# Patient Record
Sex: Female | Born: 1955 | Race: Black or African American | Hispanic: No | Marital: Married | Smoking: Never smoker
Health system: Southern US, Community
[De-identification: ages and names within clinical notes are randomized; demographics above are authoritative.]

## PROBLEM LIST (undated history)

## (undated) DIAGNOSIS — I509 Heart failure, unspecified: Secondary | ICD-10-CM

## (undated) DIAGNOSIS — J449 Chronic obstructive pulmonary disease, unspecified: Secondary | ICD-10-CM

## (undated) DIAGNOSIS — M1990 Unspecified osteoarthritis, unspecified site: Secondary | ICD-10-CM

## (undated) DIAGNOSIS — E119 Type 2 diabetes mellitus without complications: Secondary | ICD-10-CM

## (undated) HISTORY — PX: CARPAL TUNNEL RELEASE: SHX101

---

## 2004-07-13 ENCOUNTER — Emergency Department: Payer: Self-pay | Admitting: Internal Medicine

## 2005-06-20 ENCOUNTER — Ambulatory Visit: Payer: Self-pay | Admitting: General Practice

## 2007-07-16 ENCOUNTER — Other Ambulatory Visit: Payer: Self-pay

## 2007-07-16 ENCOUNTER — Emergency Department: Payer: Self-pay | Admitting: Emergency Medicine

## 2010-07-09 ENCOUNTER — Emergency Department: Payer: Self-pay | Admitting: Emergency Medicine

## 2010-09-21 ENCOUNTER — Emergency Department: Payer: Self-pay | Admitting: Unknown Physician Specialty

## 2012-07-02 ENCOUNTER — Emergency Department: Payer: Self-pay | Admitting: Emergency Medicine

## 2013-06-04 ENCOUNTER — Emergency Department: Payer: Self-pay | Admitting: Emergency Medicine

## 2013-06-20 ENCOUNTER — Emergency Department: Payer: Self-pay | Admitting: Emergency Medicine

## 2013-10-01 ENCOUNTER — Observation Stay: Payer: Self-pay | Admitting: Internal Medicine

## 2013-10-01 LAB — DRUG SCREEN, URINE

## 2013-10-01 LAB — BASIC METABOLIC PANEL
Anion Gap: 6 — ABNORMAL LOW (ref 7–16)
BUN: 14 mg/dL (ref 7–18)
CHLORIDE: 105 mmol/L (ref 98–107)
Calcium, Total: 8.2 mg/dL — ABNORMAL LOW (ref 8.5–10.1)
Co2: 29 mmol/L (ref 21–32)
Creatinine: 0.9 mg/dL (ref 0.60–1.30)
Glucose: 120 mg/dL — ABNORMAL HIGH (ref 65–99)
OSMOLALITY: 281 (ref 275–301)
Potassium: 4.3 mmol/L (ref 3.5–5.1)
Sodium: 140 mmol/L (ref 136–145)

## 2013-10-01 LAB — CBC
HCT: 43.5 % (ref 35.0–47.0)
HGB: 13.9 g/dL (ref 12.0–16.0)
MCH: 26.2 pg (ref 26.0–34.0)
MCHC: 32 g/dL (ref 32.0–36.0)
MCV: 82 fL (ref 80–100)
Platelet: 381 10*3/uL (ref 150–440)
RBC: 5.32 10*6/uL — ABNORMAL HIGH (ref 3.80–5.20)
RDW: 14.4 % (ref 11.5–14.5)
WBC: 14.3 10*3/uL — AB (ref 3.6–11.0)

## 2013-10-01 LAB — CK TOTAL AND CKMB (NOT AT ARMC)
CK, Total: 57 U/L
CK, Total: 56 U/L
CK-MB: 1 ng/mL (ref 0.5–3.6)
CK-MB: 0.9 ng/mL (ref 0.5–3.6)

## 2013-10-01 LAB — TROPONIN I
Troponin-I: 0.06 ng/mL — ABNORMAL HIGH
Troponin-I: 0.05 ng/mL
Troponin-I: 0.04 ng/mL

## 2013-10-01 LAB — PRO B NATRIURETIC PEPTIDE: B-Type Natriuretic Peptide: 1427 pg/mL — ABNORMAL HIGH (ref 0–125)

## 2013-10-02 LAB — CBC WITH DIFFERENTIAL/PLATELET
BASOS ABS: 0.2 10*3/uL — AB (ref 0.0–0.1)
BASOS PCT: 1.2 %
Eosinophil #: 0.1 10*3/uL (ref 0.0–0.7)
Eosinophil %: 1.1 %
HCT: 40.3 % (ref 35.0–47.0)
HGB: 12.9 g/dL (ref 12.0–16.0)
Lymphocyte #: 3.4 10*3/uL (ref 1.0–3.6)
Lymphocyte %: 24.7 %
MCH: 26.1 pg (ref 26.0–34.0)
MCHC: 32.1 g/dL (ref 32.0–36.0)
MCV: 81 fL (ref 80–100)
MONOS PCT: 11.2 %
Monocyte #: 1.5 x10 3/mm — ABNORMAL HIGH (ref 0.2–0.9)
NEUTROS ABS: 8.4 10*3/uL — AB (ref 1.4–6.5)
Neutrophil %: 61.8 %
PLATELETS: 348 10*3/uL (ref 150–440)
RBC: 4.96 10*6/uL (ref 3.80–5.20)
RDW: 14.4 % (ref 11.5–14.5)
WBC: 13.6 10*3/uL — ABNORMAL HIGH (ref 3.6–11.0)

## 2013-10-02 LAB — BASIC METABOLIC PANEL
Anion Gap: 7 (ref 7–16)
BUN: 15 mg/dL (ref 7–18)
CALCIUM: 8.3 mg/dL — AB (ref 8.5–10.1)
CHLORIDE: 102 mmol/L (ref 98–107)
CO2: 29 mmol/L (ref 21–32)
CREATININE: 0.93 mg/dL (ref 0.60–1.30)
EGFR (African American): >60
EGFR (Non-African Amer.): >60
Glucose: 132 mg/dL — ABNORMAL HIGH (ref 65–99)
OSMOLALITY: 278 (ref 275–301)
Potassium: 4 mmol/L (ref 3.5–5.1)
Sodium: 138 mmol/L (ref 136–145)

## 2013-10-03 LAB — CBC WITH DIFFERENTIAL/PLATELET
BASOS PCT: 0.8 %
Basophil #: 0.1 10*3/uL (ref 0.0–0.1)
Eosinophil #: 0.2 10*3/uL (ref 0.0–0.7)
Eosinophil %: 1.1 %
HCT: 40.7 % (ref 35.0–47.0)
HGB: 12.9 g/dL (ref 12.0–16.0)
Lymphocyte #: 3.6 10*3/uL (ref 1.0–3.6)
Lymphocyte %: 26.4 %
MCH: 25.9 pg — ABNORMAL LOW (ref 26.0–34.0)
MCHC: 31.7 g/dL — AB (ref 32.0–36.0)
MCV: 82 fL (ref 80–100)
MONOS PCT: 14.7 %
Monocyte #: 2 x10 3/mm — ABNORMAL HIGH (ref 0.2–0.9)
Neutrophil #: 7.8 10*3/uL — ABNORMAL HIGH (ref 1.4–6.5)
Neutrophil %: 57 %
Platelet: 334 10*3/uL (ref 150–440)
RBC: 4.97 10*6/uL (ref 3.80–5.20)
RDW: 14.7 % — ABNORMAL HIGH (ref 11.5–14.5)
WBC: 13.8 10*3/uL — ABNORMAL HIGH (ref 3.6–11.0)

## 2013-10-03 LAB — BASIC METABOLIC PANEL
ANION GAP: 6 — AB (ref 7–16)
BUN: 14 mg/dL (ref 7–18)
Calcium, Total: 8.3 mg/dL — ABNORMAL LOW (ref 8.5–10.1)
Chloride: 99 mmol/L (ref 98–107)
Co2: 30 mmol/L (ref 21–32)
Creatinine: 1.02 mg/dL (ref 0.60–1.30)
EGFR (African American): >60
EGFR (Non-African Amer.): >60
Glucose: 151 mg/dL — ABNORMAL HIGH (ref 65–99)
Osmolality: 273 (ref 275–301)
Potassium: 4.3 mmol/L (ref 3.5–5.1)
Sodium: 135 mmol/L — ABNORMAL LOW (ref 136–145)

## 2013-10-03 LAB — HEMOGLOBIN A1C: HEMOGLOBIN A1C: 6.7 % — AB (ref 4.2–6.3)

## 2013-10-29 ENCOUNTER — Observation Stay: Payer: Self-pay | Admitting: Psychiatry

## 2013-10-29 LAB — BASIC METABOLIC PANEL
ANION GAP: 11 (ref 7–16)
BUN: 19 mg/dL — ABNORMAL HIGH (ref 7–18)
Calcium, Total: 9.1 mg/dL (ref 8.5–10.1)
Chloride: 100 mmol/L (ref 98–107)
Co2: 29 mmol/L (ref 21–32)
Creatinine: 1 mg/dL (ref 0.60–1.30)
Glucose: 132 mg/dL — ABNORMAL HIGH (ref 65–99)
Osmolality: 284 (ref 275–301)
Potassium: 3.2 mmol/L — ABNORMAL LOW (ref 3.5–5.1)
Sodium: 140 mmol/L (ref 136–145)

## 2013-10-29 LAB — URINALYSIS, COMPLETE
BILIRUBIN, UR: NEGATIVE
BLOOD: NEGATIVE
BLOOD: NEGATIVE
Bacteria: NONE SEEN
Bilirubin,UR: NEGATIVE
Glucose,UR: NEGATIVE mg/dL (ref 0–75)
Glucose,UR: NEGATIVE mg/dL (ref 0–75)
KETONE: NEGATIVE
KETONE: NEGATIVE
Leukocyte Esterase: NEGATIVE
NITRITE: NEGATIVE
Nitrite: NEGATIVE
PH: 5 (ref 4.5–8.0)
Ph: 5 (ref 4.5–8.0)
Protein: 30
Protein: NEGATIVE
Specific Gravity: 1.028 (ref 1.003–1.030)
Specific Gravity: 1.018 (ref 1.003–1.030)
Squamous Epithelial: 48
Squamous Epithelial: NONE SEEN
WBC UR: 24 /HPF (ref 0–5)

## 2013-10-29 LAB — CBC
HCT: 41.7 % (ref 35.0–47.0)
HGB: 13.1 g/dL (ref 12.0–16.0)
MCH: 25.4 pg — ABNORMAL LOW (ref 26.0–34.0)
MCHC: 31.4 g/dL — ABNORMAL LOW (ref 32.0–36.0)
MCV: 81 fL (ref 80–100)
Platelet: 656 10*3/uL — ABNORMAL HIGH (ref 150–440)
RBC: 5.14 10*6/uL (ref 3.80–5.20)
RDW: 14.6 % — ABNORMAL HIGH (ref 11.5–14.5)
WBC: 24.3 10*3/uL — ABNORMAL HIGH (ref 3.6–11.0)

## 2013-10-29 LAB — DIFFERENTIAL
Basophil #: 0.3 10*3/uL — ABNORMAL HIGH (ref 0.0–0.1)
Basophil %: 1.2 %
EOS ABS: 0.1 10*3/uL (ref 0.0–0.7)
EOS PCT: 0.3 %
LYMPHS ABS: 6.4 10*3/uL — AB (ref 1.0–3.6)
Lymphocyte %: 26.5 %
MONO ABS: 1.5 x10 3/mm — AB (ref 0.2–0.9)
Monocyte %: 6.1 %
NEUTROS ABS: 16 10*3/uL — AB (ref 1.4–6.5)
Neutrophil %: 65.9 %

## 2013-10-29 LAB — TROPONIN I: Troponin-I: <0.02 ng/mL

## 2013-10-29 LAB — MAGNESIUM: Magnesium: 2 mg/dL

## 2013-10-30 LAB — CBC WITH DIFFERENTIAL/PLATELET
BASOS ABS: 0.1 10*3/uL (ref 0.0–0.1)
BASOS PCT: 0.6 %
EOS ABS: 0 10*3/uL (ref 0.0–0.7)
Eosinophil %: 0.1 %
HCT: 40.3 % (ref 35.0–47.0)
HGB: 12.5 g/dL (ref 12.0–16.0)
Lymphocyte #: 1.5 10*3/uL (ref 1.0–3.6)
Lymphocyte %: 8.3 %
MCH: 25.3 pg — ABNORMAL LOW (ref 26.0–34.0)
MCHC: 31.1 g/dL — ABNORMAL LOW (ref 32.0–36.0)
MCV: 81 fL (ref 80–100)
Monocyte #: 0.4 x10 3/mm (ref 0.2–0.9)
Monocyte %: 2.3 %
NEUTROS ABS: 16.5 10*3/uL — AB (ref 1.4–6.5)
NEUTROS PCT: 88.7 %
PLATELETS: 596 10*3/uL — AB (ref 150–440)
RBC: 4.96 10*6/uL (ref 3.80–5.20)
RDW: 14.5 % (ref 11.5–14.5)
WBC: 18.6 10*3/uL — AB (ref 3.6–11.0)

## 2013-10-30 LAB — TROPONIN I

## 2013-10-31 LAB — URINE CULTURE

## 2013-11-03 LAB — CULTURE, BLOOD (SINGLE)

## 2014-05-11 ENCOUNTER — Emergency Department
Admit: 2014-05-11 | Disposition: A | Discharge status: Home / Self Care | Payer: Self-pay | Admitting: Emergency Medicine

## 2014-06-03 NOTE — Discharge Summary (Signed)
PATIENT NAME:  Amber Aguilar, Amber Aguilar MR#:  [id number] DATE OF BIRTH:  1955/04/06  DATE OF ADMISSION:  10/29/2013 DATE OF DISCHARGE:  10/30/2013  PRIMARY CARE PHYSICIAN: Nonlocal.  DISCHARGE DIAGNOSES:  1. Syncope, possibly due to vasovagal syncope.  2. Leukocytosis.  3. Chronic diastolic congestive heart failure.   CONDITION: Stable.   CODE STATUS: Full code.   HOME MEDICATIONS: Please refer to the medication reconciliation list.   DIET: Low-sodium diet.   ACTIVITY: As tolerated.   FOLLOW-UP CARE: With PCP within 1 to 2 weeks.   REASON FOR ADMISSION: Syncope.   HOSPITAL COURSE: The patient is a 59 year old, African-American female with a history of diastolic CHF, recent acute bronchitis on prednisone that presented to the ED with 1 episode of syncope during shortness of breath and cough. The patient passed out for 1 minute, but no seizure or no incontinence. For a detailed history and physical examination, please refer to the admission note dictated by Dr. Darvin Neighbours. After admission, the patient had been placed on telemonitor which shows normal sinus rhythm. The patient's syncope is possibly due to vasovagal syncope. The patient had no complaints after admission. Leukocytosis is possibly due to prednisone and decreased from 24.5 to 18.6. The patient has a history of chronic diastolic CHF, which is stable. The patient has no complaints. She is clinically stable and will be discharged home today. I discussed the patient's discharge plan with the patient and nurse.   TIME SPENT: About 33 minutes.     ____________________________ Demetrios Loll, MD qc:TT D: 10/30/2013 11:32:56 ET T: 10/30/2013 18:40:22 ET JOB#: [id number]  cc: Demetrios Loll, MD, <Dictator> Demetrios Loll MD ELECTRONICALLY SIGNED 10/31/2013 12:04

## 2014-06-03 NOTE — Consult Note (Signed)
PATIENT NAME:  Amber Aguilar, RESENDES MR#:  [id number] DATE OF BIRTH:  Mar 17, 1955  DATE OF CONSULTATION:  10/01/2013  REFERRING PHYSICIAN:   CONSULTING PHYSICIAN:  Isaias Cowman, MD  PRIMARY CARE PHYSICIAN: Dr. Brunetta Genera.   REASON FOR CONSULTATION: Shortness of breath.   HISTORY OF PRESENT ILLNESS: The patient is a 59 year old female who presents with a 5-day history of worsening peripheral edema and shortness of breath. The patient was recently seen by her primary care physician at [facility] for apparent shortness of breath and possible pneumonia. She was recently started on prednisone. Since then, the patient has noted some mild peripheral edema, and today experienced shortness of breath just by walking across the room. She presented to [facility] Emergency Room where a chest CT revealed pulmonary edema. Chest x-ray revealed mild bibasal atelectasis and infiltrate. Admission labs were notable for very mild elevation and troponin of 0.06 with a follow-up that was normal. BNP was 1427. The patient denies chest pain.   PAST MEDICAL HISTORY:  1.  Recent questionable pneumonia.  2.  Carpal tunnel syndrome.   MEDICATIONS: Ibuprofen 800 mg p.o. t.i.d., p.r.n., Flonase 2 sprays each nostril daily, and Prempro 1 daily.   SOCIAL HISTORY: The patient is married. She denies tobacco abuse.   FAMILY HISTORY: Positive for coronary artery disease.   REVIEW OF SYSTEMS:  CONSTITUTIONAL: No fever or chills.  EYES: No blurry vision.  EARS: No hearing loss.  RESPIRATORY: The patient has had progressive shortness of breath.  CARDIOVASCULAR: No chest pain.  GASTROINTESTINAL: No nausea, vomiting, or diarrhea.  GENITOURINARY: No dysuria or hematuria.  ENDOCRINE: No polyuria or polydipsia.  MUSCULOSKELETAL: No arthralgias or myalgias.  NEUROLOGICAL: No focal muscle weakness or numbness.  PSYCHOLOGICAL: No depression or anxiety.   PHYSICAL EXAMINATION:  VITAL SIGNS: Blood pressure 108/52, pulse 102,  respirations 18, temperature 97.8, pulse oximetry 98%.  HEENT: Pupils equal and reactive to light and accommodation.  NECK: Supple without thyromegaly.  LUNGS: Clear.  HEART: Normal JVP. Normal PMI. Regular rate and rhythm. Normal S1, S2. No appreciable gallop, murmur, or rub.  ABDOMEN: Soft and nontender. Pulses were intact bilaterally.  MUSCULOSKELETAL: Normal muscle tone.  NEUROLOGIC: The patient is alert and oriented x 3. Motor and sensory both grossly intact.   IMPRESSION: A 59 year old female with recent questionable bronchitis/pneumonia treated with antibiotics and prednisone was scheduled to have pulmonary consult,  develop shortness of breath, presented to [facility] Emergency Room where the patient had a very mildly elevation in troponin without chest pain, which is likely due to demand supply ischemia and not due to acute coronary syndrome. CT scan suggests mild pulmonary edema.   RECOMMENDATIONS:  1.  Agree with overall current therapy.  2.  Defer full dose anticoagulation.  3.  Agree with mild diuresis.  4.  Review 2-D echocardiogram.  5.  Further recommendations pending echocardiogram results.   ____________________________ Isaias Cowman, MD ap:TT D: 10/01/2013 15:39:16 ET T: 10/01/2013 16:08:04 ET JOB#: [id number]  cc: Isaias Cowman, MD, <Dictator> Isaias Cowman MD ELECTRONICALLY SIGNED 10/13/2013 16:00

## 2014-06-03 NOTE — Discharge Summary (Signed)
PATIENT NAME:  Amber Aguilar, Amber Aguilar MR#:  [id number] DATE OF BIRTH:  [birth year]  DATE OF ADMISSION:  10/01/2013 DATE OF DISCHARGE:  10/03/2013  DISCHARGE DIAGNOSES: 1.  Acute bronchitis, on empiric antibiotics.  2.  Chronic diastolic congestive heart failure, well-compensated.  3.  Elevated troponins due to supply/demand ischemia, no myocardial infarction.   SECONDARY DIAGNOSIS: Carpal tunnel syndrome.   CONSULTATION: Cardiology, Dr. Saralyn Pilar.   PROCEDURES AND RADIOLOGY: Chest x-ray on August 22 showed mild basilar atelectasis/infiltrate.   CT scan of the chest on August 22 showed no significant acute pathology. Mild diffuse pulmonary edema pattern worse in the lower lobes.   A 2-D echocardiogram on August 23 showed normal LVEF of 60% to 65%. Impaired relaxation pattern of LV diastolic filling. Mildly increased LV posterior wall thickness.   HISTORY AND SHORT HOSPITAL COURSE: The patient is a 59 year old female with the above-mentioned medical problems who was admitted for shortness of breath. Please see Dr. Fulton Reek dictated history and physical for further details. Cardiology consultation was obtained with Dr. Isaias Cowman for requested 2-D echocardiogram to evaluate LV function, was come out to be a normal limit. The patient have some mild diastolic heart failure for which she was prescribed diuretic as needed. Her troponins were thought to be due to supply demand ischemia. She was thought to have acute bronchitis and no other obvious infectious signs were noted. She was feeling much better and was ambulated without any oxygen desaturation. She was discharged home in stable condition on August 24.   On the date of discharge her vital signs were as follows: Temperature 98.2, heart rate 98 per minute, respirations 18 per minute, blood pressure 120/70 mmHg, she was saturating 97% on room air.   PERTINENT PHYSICAL EXAMINATION ON THE DATE OF DISCHARGE:  CARDIOVASCULAR: S1, S2 normal.  No murmurs, rales, or gallop.  LUNGS: Clear to auscultation bilaterally. No wheezing, rales, rhonchi, or crepitation.  ABDOMEN: Soft and benign.  NEUROLOGIC: Nonfocal examination.  All other physical examination remained at baseline.   DISCHARGE MEDICATIONS: 1.  Flonase 1 inhalation daily.  2.  Omeprazole 20 mg p.o. daily.  3.  Ibuprofen 800 mg p.o. 3 times a day as needed.  4.  Conjugated estrogen/medroxyprogesterone once daily.  5.  QVAR 1 puff inhaled twice a day.  6.  Hydrochlorothiazide 25 mg p.o. daily as needed.  7.  Levaquin 750 mg p.o. daily for 5 more days.   DISCHARGE DIET: Low sodium.   DISCHARGE ACTIVITY: As tolerated.   DISCHARGE INSTRUCTIONS AND FOLLOWUP: The patient was instructed to follow up with her primary care physician, Dr. Brunetta Genera in 1 to 2 weeks. She will 47 episode of is a 9 [place] in 1 to 2 weeks. She will have followup with Dr. Saralyn Pilar in 2 to 4 weeks.   TIME SPENT DISCHARGING THIS PATIENT: 45 minutes.     ____________________________ Lucina Mellow. Manuella Ghazi, MD vss:at D: 10/03/2013 22:35:36 ET T: 10/04/2013 10:37:50 ET JOB#: [id number]  cc: Mollye Guinta S. Manuella Ghazi, MD, <Dictator> Meindert A. Brunetta Genera, MD Isaias Cowman, MD Lucina Mellow [facility] MD ELECTRONICALLY SIGNED 10/06/2013 13:22

## 2014-06-03 NOTE — H&P (Signed)
PATIENT NAME:  Amber Aguilar, VALCARCEL MR#:  [id number] DATE OF BIRTH:  [birth year]  DATE OF ADMISSION:  10/01/2013   REFERRING PHYSICIAN: Dr. Clearnce Hasten in the Emergency Room.   FAMILY PHYSICIAN:  Lorelee Market, MD   REASON FOR ADMISSION: Shortness of breath.   HISTORY OF PRESENT ILLNESS: The patient is a 59 year old female with no significant cardiac history who presents to the Emergency Room with a 5 day history of worsening peripheral edema and shortness of breath with dyspnea on exertion.  In the Emergency Room, the patient was noted to have a mildly elevated troponin.  CT of the chest suggests pulmonary edema.  She denies chest pain.  She is now admitted for further evaluation.   PAST MEDICAL HISTORY:  1.  Menopausal syndrome.  2.  Carpal tunnel syndrome.  3.  Status post bilateral tubal ligation.   MEDICATIONS:  1.  Ibuprofen 800 mg p.o. t.i.d. p.r.n.  2.  Flonase 2 puffs in each nostril daily.  3.  Prempro 1 p.o. daily.   ALLERGIES: NEOSPORIN.   SOCIAL HISTORY: Negative for upper tobacco use.   FAMILY HISTORY: Positive for coronary artery disease.   REVIEW OF SYSTEMS: CONSTITUTIONAL: No fever. Has had some weight gain.  EYES: No blurred or double vision. No glaucoma.  ENT: No tinnitus or hearing loss. No nasal discharge or bleeding. No difficulty swallowing.  RESPIRATORY: No cough or wheezing. Denies hemoptysis.  CARDIOVASCULAR: No chest pain or orthopnea. Has had palpitations. No syncope.  GASTROINTESTINAL: No nausea, vomiting, or diarrhea. No abdominal pain. No change in bowel habits.  GENITOURINARY: No dysuria or hematuria. No incontinence.  ENDOCRINE: No polyuria or polydipsia. No heat or cold intolerance.  HEMATOLOGIC: The patient denies anemia, easy bruising, or bleeding.  LYMPHATIC: No swollen glands.  MUSCULOSKELETAL: The patient denies pain in her neck, back, shoulders, knees, or hips. No gout.  NEUROLOGIC: No numbness or migraines. Denies stroke or  seizures. PSYCHIATRIC:  Patient denies anxiety, insomnia or depression.   PHYSICAL EXAMINATION:  GENERAL: Patient is in no acute distress.  VITAL SIGNS: Currently remarkable for a blood pressure of 120/73, heart rate of 100, respiratory rate of 18, temperature of 98.3, sat 100% on room air.  HEENT: Normocephalic, atraumatic. Pupils equally round and reactive to light and accommodation. Extraocular movements are intact. Sclerae are anicteric. Conjunctivae are clear.  OROPHARYNX: Clear.  NECK: Supple without JVD. No adenopathy or thyromegaly is noted.  LUNGS: Reveal faint basilar crackles without wheezes or rhonchi. No dullness. Respiratory effort is normal.  CARDIAC: Regular rate and rhythm with normal S1, S2. No significant rubs, murmurs or gallops. PMI is nondisplaced. Chest wall is nontender.  ABDOMEN: Soft, nontender, with normoactive bowel sounds. No organomegaly or masses were appreciated. No hernias or bruits were noted.  EXTREMITIES: Revealed 1+ edema, without clubbing or cyanosis.  Pulses were 2+ bilaterally.  SKIN: Warm and dry without rash or lesions.  NEUROLOGIC: Cranial nerves II through XII grossly intact. Deep tendon reflexes were symmetric. Motor and sensory examination is nonfocal.  PSYCHIATRIC: Revealed a patient who is alert and oriented to person, place, and time. She was cooperative and used good judgment.   LABORATORY DATA:  EKG revealed sinus rhythm at 104 beats per minute with no acute ischemic changes.  CT of the chest revealed pulmonary edema with no evidence of pulmonary emboli.  Her troponin was 0.06 with a BNP of 1427.  Glucose 120 with a BUN of 14, creatinine of 0.9 and a GFR of greater than 60.  White count was 14.3 with a hemoglobin of 13.9.   ASSESSMENT:  1.  Elevated troponin of unclear significance.  2.  Shortness of breath.  3.  Peripheral edema.  4.  Pulmonary edema, congestive heart failure.   PLAN: The patient will be observed on telemetry with aspirin.  We will begin low-dose ACE inhibitor and hydrochlorothiazide for diuresis.  We will follow serial cardiac enzymes and obtain an echocardiogram. We will consult cardiology given the patient's elevated troponin and pulmonary edema.  Follow up routine labs in the morning.  Further treatment and evaluation will depend upon the patient's progress.   TOTAL TIME SPENT ON THIS PATIENT: 45 minutes.      ____________________________ Leonie Douglas Doy Hutching, MD jds:DT D: 10/01/2013 13:46:16 ET T: 10/01/2013 13:59:56 ET JOB#: [id number]  cc: Leonie Douglas. Doy Hutching, MD, <Dictator> An Lannan Lennice Sites MD ELECTRONICALLY SIGNED 10/01/2013 15:10

## 2014-06-03 NOTE — H&P (Signed)
PATIENT NAME:  Amber Aguilar, Amber Aguilar MR#:  [id number] DATE OF BIRTH:  [birth year]  DATE OF ADMISSION:  10/29/2013  PRIMARY CARE PHYSICIAN: At [facility].   PULMONOLOGIST: At [facility].   CHIEF COMPLAINT: Syncope.   HISTORY OF PRESENTING ILLNESS: A 59 year old, African-American female patient with history of diastolic CHF, recent acute bronchitis and presently on prednisone, presented to the emergency room complaining of an episode of syncope. The [place] the mall, was feeling a little lightheaded, not feeling well, had some shortness of breath and a fit of coughing. She was trying to reach for her albuterol inhaler, used it but then passed out. The patient passed out for about a minute. Did not have any seizures. No incontinence. Woke up without any confusion. The last time she had an episode like this was about 5 years back when she was dehydrated. The patient was recently seen in the hospital in August 2015 for acute bronchitis and was also diagnosed with chronic diastolic CHF during that admission. Had mild elevation in troponin of 0.6 and seen by cardiology, Dr. Saralyn Pilar, She has no history of any arrhythmias. The patient was recently started on prednisone daily and mycophenolate 5 mg 2 times a day by her pulmonologist 2 weeks back at [facility].   PAST MEDICAL HISTORY: 1. Chronic diastolic CHF.  2. Menopause.  3. Carpal tunnel syndrome.  4. Status post bilateral tubal ligation.   ALLERGIES: NEOSPORIN.   SOCIAL HISTORY: The patient does not use tobacco. No alcohol. No illicit drug use. 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   FAMILY HISTORY: Positive for CAD.   REVIEW OF SYSTEMS: CONSTITUTIONAL: Complains of some fatigue.  EYES: No blurred vision, pain or redness.  ENT: No tinnitus, ear pain, hearing loss.  RESPIRATORY: No cough, wheeze, hemoptysis.  CARDIOVASCULAR: No chest pain, orthopnea, edema.  GASTROINTESTINAL: No nausea, vomiting or abdominal pain.   GENITOURINARY: No dysuria, hematuria or frequency.  ENDOCRINE: No polyuria, nocturia, thyroid problems. Hematologic and lymphatic: No anemia, easy bruising, bleeding.  INTEGUMENTARY: No acne, rash, lesion.  MUSCULOSKELETAL: No back pain or gout. NEUROLOGIC: No focal numbness, weakness, seizure.  PSYCHIATRIC: No anxiety or depression.   HOME MEDICATIONS: 1. Prednisone 20 mg daily, started 2 weeks back.  2. Mycophenolate 500 mg 2 times a day, starting 2 weeks back.  3. Ibuprofen 800 twice a day as needed for pain.  4. Omeprazole 20 mg daily.  5. Qvar 1 puff inhaled 2 times a day.   PHYSICAL EXAMINATION: VITAL SIGNS: Temperature 98.9, pulse of 91 and 102, blood pressure 133/63, saturating 98% on room air. Orthostatics done showed a drop in blood pressure only of 14  points on systolic blood pressure. No change of heart rate.  GENERAL: Obese, African-American female patient lying in bed, seems comfortable, conversational, cooperative with exam.  PSYCHIATRIC: Alert and oriented x 3. Mood and affect appropriate. Judgment intact.  HEENT: Atraumatic, normocephalic. Oral mucosa moist and pink. External ears and nose normal. No pallor. No icterus. Pupils bilaterally equal and reactive to light.  NECK: Supple. No lymphadenopathy palpable. Trachea is midline. No JVD.  CARDIOVASCULAR: S1, S2, without any murmurs. Peripheral pulses 2+. No edema.  RESPIRATORY: Normal work of breathing. Clear to auscultation on both sides.  GASTROINTESTINAL: Soft abdomen, nontender. Bowel signs present. No hepatosplenomegaly palpable.  SKIN: Warm and dry. No petechiae, rash, ulcers.  MUSCULOSKELETAL: No joint tenderness in large joints. Normal muscle tone.  NEUROLOGICAL: Motor strength 5/5 in upper and lower extremities.  Sensation is intact all over.  LYMPHATIC: No cervical lymphadenopathy.   LABORATORY STUDIES: Show glucose 132, BUN 19, creatinine 1, sodium 140, potassium 3.2 with chloride of 100, bicarbonate 29.  Troponin less than 0.02. WBC 24.3, hemoglobin of 13.1, platelets of 656,000 with neutrophils at 65.9 with no bandemia. Urinalysis shows trace bacteria and 24 WBCs.   DIAGNOSTIC DATA: EKG showed normal sinus rhythm, nothing acute. Chest x-ray shows findings consistent with low-grade CHF with no change from prior chest x-ray. No pneumonia or effusion.   ASSESSMENT AND PLAN: 1. Syncope. This seems to be possibly vasovagal syncope as this occurred after a fit of coughing and shortness of breath. The patient's troponin is normal. EKG looks unremarkable. The patient's orthostatics have been negative. She did have a recent echocardiogram, which showed a normal ejection fraction and will not have to be repeated. We will admit the patient under telemetry to rule out any arrhythmias. Potassium is mildly low, which will be replaced. We will check a magnesium level. The patient does seem to have some urinary tract infection, which could be the cause.  2. Leukocytosis. The patient has significant leukocytosis of 24.5. She is on prednisone at home, but still this is a significant elevation likely contributed to by the urinary tract infection she has. We will start the patient on ceftriaxone and get urine cultures.  3. CellCept use. The patient is presently on both prednisone and CellCept. It is unclear why she is on the CellCept, as she has never had any transplants. This was started by her pulmonologist.  4. Chronic diastolic congestive heart failure. She does not have any crackles on exam, no edema. The chest x-ray suggests low-grade congestive heart failure. This is the same as before, likely chronic changes.  5. Deep vein thrombosis prophylaxis with Lovenox.   CODE STATUS: Full code.   TIME SPENT: On this case was 45 minutes.     ____________________________ Leia Alf Rydell Wiegel, MD srs:TT D: 10/29/2013 19:39:10 ET T: 10/29/2013 19:59:51 ET JOB#: [id number]  cc: Alveta Heimlich R. Haleemah Buckalew, MD, <Dictator> [place] MD ELECTRONICALLY SIGNED 11/02/2013 16:01

## 2014-07-01 ENCOUNTER — Encounter: Payer: Self-pay | Admitting: Emergency Medicine

## 2014-07-01 ENCOUNTER — Emergency Department: Payer: BC Managed Care – PPO

## 2014-07-01 ENCOUNTER — Emergency Department
Admission: EM | Admit: 2014-07-01 | Discharge: 2014-07-01 | Disposition: A | Discharge status: Home / Self Care | Payer: BC Managed Care – PPO | Attending: Emergency Medicine | Admitting: Emergency Medicine

## 2014-07-01 DIAGNOSIS — J40 Bronchitis, not specified as acute or chronic: Secondary | ICD-10-CM

## 2014-07-01 DIAGNOSIS — R05 Cough: Secondary | ICD-10-CM | POA: Diagnosis present

## 2014-07-01 DIAGNOSIS — J441 Chronic obstructive pulmonary disease with (acute) exacerbation: Secondary | ICD-10-CM | POA: Insufficient documentation

## 2014-07-01 HISTORY — DX: Chronic obstructive pulmonary disease, unspecified: J44.9

## 2014-07-01 HISTORY — DX: Unspecified osteoarthritis, unspecified site: M19.90

## 2014-07-01 HISTORY — DX: Heart failure, unspecified: I50.9

## 2014-07-01 MED ORDER — AZITHROMYCIN 250 MG PO TABS: ORAL_TABLET | ORAL | Status: AC

## 2014-07-01 MED ORDER — BENZONATATE 100 MG PO CAPS
100.0000 mg | ORAL_CAPSULE | Freq: Four times a day (QID) | ORAL | Status: DC | PRN
Start: 2014-07-01 — End: 2015-06-03

## 2014-07-01 NOTE — ED Provider Notes (Signed)
CSN: [id number]     Arrival date & time 07/01/14  1240 History   First MD Initiated Contact with Patient 07/01/14 1300     Chief Complaint  Patient presents with  . Cough  . Nasal Congestion     Patient is a 59 y.o. female presenting with cough. The history is provided by the patient.  Cough Cough characteristics:  Productive Sputum characteristics:  Green Severity:  Moderate Onset quality:  Gradual Timing:  Constant Progression:  Worsening Chronicity:  New Smoker: no   Context: sick contacts   Relieved by:  Nothing Worsened by:  Activity, deep breathing and exposure to cold air Ineffective treatments:  None tried Associated symptoms: rhinorrhea, shortness of breath and sinus congestion   Associated symptoms: no chills, no eye discharge, no fever, no myalgias and no rash   Risk factors: no recent infection and no recent travel     Past Medical History  Diagnosis Date  . Arthritis   . COPD (chronic obstructive pulmonary disease)   . CHF (congestive heart failure)    No past surgical history on file. No family history on file. History  Substance Use Topics  . Smoking status: Never Smoker   . Smokeless tobacco: Never Used  . Alcohol Use: No   OB History    No data available     Review of Systems  Constitutional: Negative for fever and chills.  HENT: Positive for congestion and rhinorrhea.   Eyes: Negative for discharge.  Respiratory: Positive for cough and shortness of breath.   Musculoskeletal: Negative for myalgias.  Skin: Negative for rash.  All other systems reviewed and are negative.     Allergies  Neosporin  Home Medications   Prior to Admission medications   Not on File   BP 129/74 mmHg  Pulse 95  Temp(Src) 97.8 F (36.6 C) (Oral)  Resp 22  Ht 5\' 2"  (1.575 m)  Wt 202 lb (91.627 kg)  BMI 36.94 kg/m2  SpO2 100% Physical Exam  Constitutional: She is oriented to person, place, and time. Vital signs are normal. She appears well-developed and  well-nourished.  Non-toxic appearance. She does not have a sickly appearance.  HENT:  Head: Normocephalic and atraumatic.  Right Ear: Tympanic membrane and external ear normal.  Left Ear: Tympanic membrane and external ear normal.  Nose: Rhinorrhea present.  Mouth/Throat: Uvula is midline, oropharynx is clear and moist and mucous membranes are normal.  Neck: Normal range of motion. Neck supple.  Cardiovascular: Normal rate, regular rhythm, normal heart sounds and intact distal pulses.   Pulmonary/Chest: Effort normal and breath sounds normal. No respiratory distress. She has no rales.  Musculoskeletal: Normal range of motion.  No calf tenderness or pedal edema   Lymphadenopathy:    She has no cervical adenopathy.  Neurological: She is alert and oriented to person, place, and time.  Skin: Skin is warm and dry.  Psychiatric: She has a normal mood and affect. Her behavior is normal. Judgment and thought content normal.  Nursing note and vitals reviewed.   ED Course  Procedures (including critical care time) Labs Review Labs Reviewed - No data to display  Imaging Review Dg Chest 2 View  07/01/2014   CLINICAL DATA:  Patient with cough and congestion for 2 days.  EXAM: CHEST  2 VIEW  COMPARISON:  Chest radiograph 05/11/2014  FINDINGS: Stable cardiac and mediastinal contours. Bilateral lower lung linear opacities. No large consolidative pulmonary opacity. No pleural effusion or pneumothorax. Mid thoracic spine degenerative changes.  IMPRESSION: Bilateral lower lung linear opacities favored to represent atelectasis and bronchovascular crowding.   Electronically Signed   By: Lovey Newcomer M.D.   On: 07/01/2014 13:48     EKG Interpretation None      MDM  CXR to evaluate given pt's history of COPD and complaint of mild dyspnea.  No evidence of PNA on CXR Zpak course to cover for bronchitis. Tessalon perles PRN Strict return precautions given: return for increased dyspnea, high fevers,  persistent symptoms  Final diagnoses:  Bronchitis        Corliss Parish, PA-C 07/01/14 1406  Lisa Roca, MD 07/03/14 [phone]

## 2014-07-01 NOTE — ED Notes (Signed)
Patient to ED with c/o sinus congestion as well as cough for 2 days. Patient reports has been trying to get rid of it at home but not getting any better.

## 2015-06-03 ENCOUNTER — Emergency Department
Admission: EM | Admit: 2015-06-03 | Discharge: 2015-06-03 | Disposition: A | Discharge status: Home / Self Care | Payer: BC Managed Care – PPO | Attending: Emergency Medicine | Admitting: Emergency Medicine

## 2015-06-03 ENCOUNTER — Emergency Department: Payer: BC Managed Care – PPO

## 2015-06-03 ENCOUNTER — Encounter: Payer: Self-pay | Admitting: *Deleted

## 2015-06-03 DIAGNOSIS — J449 Chronic obstructive pulmonary disease, unspecified: Secondary | ICD-10-CM | POA: Insufficient documentation

## 2015-06-03 DIAGNOSIS — I509 Heart failure, unspecified: Secondary | ICD-10-CM | POA: Insufficient documentation

## 2015-06-03 DIAGNOSIS — M1990 Unspecified osteoarthritis, unspecified site: Secondary | ICD-10-CM | POA: Insufficient documentation

## 2015-06-03 DIAGNOSIS — J069 Acute upper respiratory infection, unspecified: Secondary | ICD-10-CM | POA: Insufficient documentation

## 2015-06-03 DIAGNOSIS — R6883 Chills (without fever): Secondary | ICD-10-CM | POA: Diagnosis present

## 2015-06-03 MED ORDER — BENZONATATE 100 MG PO CAPS: 200.0000 mg | ORAL_CAPSULE | Freq: Three times a day (TID) | ORAL | Status: AC | PRN

## 2015-06-03 MED ORDER — PSEUDOEPH-BROMPHEN-DM 30-2-10 MG/5ML PO SYRP: 5.0000 mL | ORAL_SOLUTION | Freq: Four times a day (QID) | ORAL | Status: DC | PRN

## 2015-06-03 NOTE — ED Notes (Signed)
NAD noted at time of D/C. Pt denies questions or concerns. Pt ambulatory to the lobby at this time.  

## 2015-06-03 NOTE — ED Notes (Signed)
Patient c/o fever, chills and cough since Friday. Patient also c/o mild wheezing.

## 2015-06-03 NOTE — Discharge Instructions (Signed)
Upper Respiratory Infection, Adult Most upper respiratory infections (URIs) are caused by a virus. A URI affects the nose, throat, and upper air passages. The most common type of URI is often called "the common cold." HOME CARE   Take medicines only as told by your doctor.  Gargle warm saltwater or take cough drops to comfort your throat as told by your doctor.  Use a warm mist humidifier or inhale steam from a shower to increase air moisture. This may make it easier to breathe.  Drink enough fluid to keep your pee (urine) clear or pale yellow.  Eat soups and other clear broths.  Have a healthy diet.  Rest as needed.  Go back to work when your fever is gone or your doctor says it is okay.  You may need to stay home longer to avoid giving your URI to others.  You can also wear a face mask and wash your hands often to prevent spread of the virus.  Use your inhaler more if you have asthma.  Do not use any tobacco products, including cigarettes, chewing tobacco, or electronic cigarettes. If you need help quitting, ask your doctor. GET HELP IF:  You are getting worse, not better.  Your symptoms are not helped by medicine.  You have chills.  You are getting more short of breath.  You have brown or red mucus.  You have yellow or brown discharge from your nose.  You have pain in your face, especially when you bend forward.  You have a fever.  You have puffy (swollen) neck glands.  You have pain while swallowing.  You have white areas in the back of your throat. GET HELP RIGHT AWAY IF:   You have very bad or constant:  Headache.  Ear pain.  Pain in your forehead, behind your eyes, and over your cheekbones (sinus pain).  Chest pain.  You have long-lasting (chronic) lung disease and any of the following:  Wheezing.  Long-lasting cough.  Coughing up blood.  A change in your usual mucus.  You have a stiff neck.  You have changes in  your:  Vision.  Hearing.  Thinking.  Mood. MAKE SURE YOU:   Understand these instructions.  Will watch your condition.  Will get help right away if you are not doing well or get worse.   This information is not intended to replace advice given to you by your health care provider. Make sure you discuss any questions you have with your health care provider.   Document Released: 07/16/2007 Document Revised: 06/13/2014 Document Reviewed: 05/04/2013 Elsevier Interactive Patient Education 2016 [place] with your doctor in [place] if any continued problems. Continue medications especially your inhalers. Tessalon as needed for cough. Increase fluids. Tylenol or ibuprofen if needed for fever.

## 2015-06-03 NOTE — ED Provider Notes (Signed)
[facility] Emergency Department Provider Note  ____________________________________________  Time seen: Approximately 3:04 PM  I have reviewed the triage vital signs and the nursing notes.   HISTORY  Chief Complaint Chills   HPI Amber Aguilar is a 60 y.o. female is here complaining of fever, chills, and productive cough for 2 days. Patient states she also has had some mild wheezing but is not experiencing that today. Currently she uses inhalers for her COPD. Patient states she also has not taken her temperature but has experienced chills.She has not taken any over-the-counter medication for cough or congestion.   Past Medical History  Diagnosis Date  . COPD (chronic obstructive pulmonary disease) ([place])   . CHF (congestive heart failure) ([place])   . Arthritis     rheumatoid    There are no active problems to display for this patient.   History reviewed. No pertinent past surgical history.  Current Outpatient Rx  Name  Route  Sig  Dispense  Refill  . benzonatate (TESSALON PERLES) 100 MG capsule   Oral   Take 2 capsules (200 mg total) by mouth 3 (three) times daily as needed for cough.   40 capsule   0     Allergies Neosporin  No family history on file.  Social History Social History  Substance Use Topics  . Smoking status: Never Smoker   . Smokeless tobacco: Never Used  . Alcohol Use: No    Review of Systems Constitutional: Positive fever/chills Eyes: No visual changes. ENT: No sore throat. Cardiovascular: Denies chest pain. Respiratory: Denies shortness of breath. Positive productive cough. Gastrointestinal: No abdominal pain.  No nausea, no vomiting. Musculoskeletal: Negative for back pain. Negative muscle aches. Skin: Negative for rash. Neurological: Negative for headaches, focal weakness or numbness.  10-point ROS otherwise negative.  ____________________________________________   PHYSICAL EXAM:  VITAL SIGNS: ED Triage  Vitals  Enc Vitals Group     BP 06/03/15 1349 115/58 mmHg     Pulse Rate 06/03/15 1349 99     Resp 06/03/15 1349 18     Temp 06/03/15 1349 99.2 F (37.3 C)     Temp Source 06/03/15 1349 Oral     SpO2 06/03/15 1349 98 %     Weight 06/03/15 1349 200 lb (90.719 kg)     Height 06/03/15 1349 5\' 2"  (1.575 m)     Head Cir --      Peak Flow --      Pain Score --      Pain Loc --      Pain Edu? --      Excl. in [place]? --     Constitutional: Alert and oriented. Well appearing and in no acute distress. Eyes: Conjunctivae are normal. PERRL. EOMI. Head: Atraumatic. Nose: Mild congestion/rhinnorhea. Mouth/Throat: Mucous membranes are moist.  Oropharynx non-erythematous. Neck: No stridor.  Supple. Hematological/Lymphatic/Immunilogical: No cervical lymphadenopathy. Cardiovascular: Normal rate, regular rhythm. Grossly normal heart sounds.  Good peripheral circulation. Respiratory: Normal respiratory effort.  No retractions. Lungs CTAB.No wheezing was noted. Gastrointestinal: Soft and nontender. No distention. Musculoskeletal: Patient moves upper and lower extremities without any difficulty and is able to without assistance. Neurologic:  Normal speech and language. No gross focal neurologic deficits are appreciated. No gait instability. Skin:  Skin is warm, dry and intact. No rash noted. Psychiatric: Mood and affect are normal. Speech and behavior are normal.  ____________________________________________   LABS (all labs ordered are listed, but only abnormal results are displayed)  Labs Reviewed -  No data to display   RADIOLOGY  Chest x-ray per radiologist shows no evidence of acute cardiopulmonary disease. ____________________________________________   PROCEDURES  Procedure(s) performed: None  Critical Care performed: No  ____________________________________________   INITIAL IMPRESSION / ASSESSMENT AND PLAN / ED COURSE  Pertinent labs & imaging results that were available  during my care of the patient were reviewed by me and considered in my medical decision making (see chart for details).  Patient was reassured that she does not have bronchitis or pneumonia. She was given a prescription for Tessalon Perles to take for cough. She is to follow-up with her PCP in [facility] if any continued problems. ____________________________________________   FINAL CLINICAL IMPRESSION(S) / ED DIAGNOSES  Final diagnoses:  Acute upper respiratory infection      Johnn Hai, PA-C 06/03/15 1558  Carrie Mew, MD 06/04/15 0021

## 2017-07-08 ENCOUNTER — Encounter: Payer: Self-pay | Admitting: Emergency Medicine

## 2017-07-08 ENCOUNTER — Emergency Department
Admission: EM | Admit: 2017-07-08 | Discharge: 2017-07-08 | Disposition: A | Discharge status: Home / Self Care | Payer: BC Managed Care – PPO | Attending: Emergency Medicine | Admitting: Emergency Medicine

## 2017-07-08 ENCOUNTER — Other Ambulatory Visit: Payer: Self-pay

## 2017-07-08 DIAGNOSIS — R197 Diarrhea, unspecified: Secondary | ICD-10-CM | POA: Diagnosis present

## 2017-07-08 DIAGNOSIS — T6291XA Toxic effect of unspecified noxious substance eaten as food, accidental (unintentional), initial encounter: Secondary | ICD-10-CM | POA: Insufficient documentation

## 2017-07-08 DIAGNOSIS — J449 Chronic obstructive pulmonary disease, unspecified: Secondary | ICD-10-CM | POA: Insufficient documentation

## 2017-07-08 DIAGNOSIS — I509 Heart failure, unspecified: Secondary | ICD-10-CM | POA: Insufficient documentation

## 2017-07-08 DIAGNOSIS — E119 Type 2 diabetes mellitus without complications: Secondary | ICD-10-CM | POA: Diagnosis not present

## 2017-07-08 DIAGNOSIS — IMO0001 Reserved for inherently not codable concepts without codable children: Secondary | ICD-10-CM

## 2017-07-08 HISTORY — DX: Type 2 diabetes mellitus without complications: E11.9

## 2017-07-08 LAB — URINALYSIS, COMPLETE (UACMP) WITH MICROSCOPIC
Bilirubin Urine: NEGATIVE
Glucose, UA: NEGATIVE mg/dL
HGB URINE DIPSTICK: NEGATIVE
Ketones, ur: NEGATIVE mg/dL
NITRITE: NEGATIVE
PH: 5 (ref 5.0–8.0)
Protein, ur: NEGATIVE mg/dL
SPECIFIC GRAVITY, URINE: 1.02 (ref 1.005–1.030)

## 2017-07-08 LAB — COMPREHENSIVE METABOLIC PANEL
ALT: 19 U/L (ref 14–54)
AST: 22 U/L (ref 15–41)
Albumin: 4.4 g/dL (ref 3.5–5.0)
Alkaline Phosphatase: 79 U/L (ref 38–126)
Anion gap: 9 (ref 5–15)
BILIRUBIN TOTAL: 0.5 mg/dL (ref 0.3–1.2)
BUN: 20 mg/dL (ref 6–20)
CO2: 29 mmol/L (ref 22–32)
Calcium: 9.5 mg/dL (ref 8.9–10.3)
Chloride: 99 mmol/L — ABNORMAL LOW (ref 101–111)
Creatinine, Ser: 0.74 mg/dL (ref 0.44–1.00)
Glucose, Bld: 135 mg/dL — ABNORMAL HIGH (ref 65–99)
POTASSIUM: 3.7 mmol/L (ref 3.5–5.1)
Sodium: 137 mmol/L (ref 135–145)
TOTAL PROTEIN: 7.6 g/dL (ref 6.5–8.1)

## 2017-07-08 LAB — CBC
HEMATOCRIT: 39.5 % (ref 35.0–47.0)
Hemoglobin: 13.7 g/dL (ref 12.0–16.0)
MCH: 27.9 pg (ref 26.0–34.0)
MCHC: 34.5 g/dL (ref 32.0–36.0)
MCV: 80.8 fL (ref 80.0–100.0)
PLATELETS: 483 10*3/uL — AB (ref 150–440)
RBC: 4.89 MIL/uL (ref 3.80–5.20)
RDW: 14 % (ref 11.5–14.5)
WBC: 8.4 10*3/uL (ref 3.6–11.0)

## 2017-07-08 LAB — LIPASE, BLOOD: Lipase: 38 U/L (ref 11–51)

## 2017-07-08 MED ORDER — ONDANSETRON 4 MG PO TBDP: 4.0000 mg | ORAL_TABLET | Freq: Three times a day (TID) | ORAL | 0 refills | Status: DC | PRN

## 2017-07-08 NOTE — ED Triage Notes (Signed)
Pt arrived via POV with reports of diarrhea and lower abdominal pain since last night. Pt states she ate some cook out food from the night before and isn;t sure if she has food poisoning.    Pt describes the pain as a cramping feeling and rates the pain 8/10

## 2017-07-08 NOTE — ED Provider Notes (Signed)
[facility] Emergency Department Provider Note   ____________________________________________    I have reviewed the triage vital signs and the nursing notes.   HISTORY  Chief Complaint Abdominal Pain and Diarrhea     HPI Amber Aguilar is a 62 y.o. female who presents with complaints of lower abdominal pain and diarrhea.  Patient reports symptoms started yesterday after eating at a cookout.  She developed lower abdominal cramping followed by diarrhea approximately 1 hour after eating.  She does have some nausea but no vomiting.  She has not taken anything for this.  Denies radiation of pain.  No other sick contacts reported.  No fevers or chills.  No recent travel.   Past Medical History:  Diagnosis Date  . Arthritis    rheumatoid  . CHF (congestive heart failure) ([place])   . COPD (chronic obstructive pulmonary disease) ([place])   . Diabetes mellitus without complication ([place])     There are no active problems to display for this patient.   History reviewed. No pertinent surgical history.  Prior to Admission medications   Medication Sig Start Date End Date Taking? Authorizing Provider  ondansetron (ZOFRAN ODT) 4 MG disintegrating tablet Take 1 tablet (4 mg total) by mouth every 8 (eight) hours as needed for nausea or vomiting. 07/08/17   Lavonia Drafts, MD     Allergies Benzalkonium chloride and Neosporin [neomycin-bacitracin zn-polymyx]  History reviewed. No pertinent family history.  Social History Social History   Tobacco Use  . Smoking status: Never Smoker  . Smokeless tobacco: Never Used  Substance Use Topics  . Alcohol use: No  . Drug use: No    Review of Systems  Constitutional: No fever/chills Eyes: No visual changes.  ENT: No sore throat. Cardiovascular: Denies chest pain. Respiratory: Denies shortness of breath. Gastrointestinal: As above Genitourinary: Negative for dysuria. Musculoskeletal: Negative for back  pain. Skin: Negative for rash. Neurological: Negative for headaches or weakness   ____________________________________________   PHYSICAL EXAM:  VITAL SIGNS: ED Triage Vitals  Enc Vitals Group     BP 07/08/17 0857 139/62     Pulse Rate 07/08/17 0857 69     Resp 07/08/17 0857 20     Temp 07/08/17 0857 98.3 F (36.8 C)     Temp Source 07/08/17 0857 Oral     SpO2 07/08/17 0857 100 %     Weight 07/08/17 0858 81.2 kg (179 lb)     Height 07/08/17 0858 1.575 m (5\' 2" )     Head Circumference --      Peak Flow --      Pain Score 07/08/17 0907 8     Pain Loc --      Pain Edu? --      Excl. in [place]? --     Constitutional: Alert and oriented. No acute distress. Pleasant and interactive Eyes: Conjunctivae are normal.  Head: Atraumatic. Nose: No congestion/rhinnorhea.  Cardiovascular: Normal rate, regular rhythm. Grossly normal heart sounds.  Good peripheral circulation. Respiratory: Normal respiratory effort.  No retractions.  Gastrointestinal: Soft and nontender. No distention.    Musculoskeletal:   Warm and well perfused Neurologic:  Normal speech and language. No gross focal neurologic deficits are appreciated.  Skin:  Skin is warm, dry and intact. No rash noted. Psychiatric: Mood and affect are normal. Speech and behavior are normal.  ____________________________________________   LABS (all labs ordered are listed, but only abnormal results are displayed)  Labs Reviewed  COMPREHENSIVE METABOLIC PANEL - Abnormal;  Notable for the following components:      Result Value   Chloride 99 (*)    Glucose, Bld 135 (*)    All other components within normal limits  CBC - Abnormal; Notable for the following components:   Platelets 483 (*)    All other components within normal limits  URINALYSIS, COMPLETE (UACMP) WITH MICROSCOPIC - Abnormal; Notable for the following components:   Color, Urine YELLOW (*)    APPearance HAZY (*)    Leukocytes, UA SMALL (*)    Bacteria, UA RARE (*)     All other components within normal limits  LIPASE, BLOOD   ____________________________________________  EKG   ____________________________________________  RADIOLOGY   ____________________________________________   PROCEDURES  Procedure(s) performed: No  Procedures   Critical Care performed: No ____________________________________________   INITIAL IMPRESSION / ASSESSMENT AND PLAN / ED COURSE  Pertinent labs & imaging results that were available during my care of the patient were reviewed by me and considered in my medical decision making (see chart for details).  Patient well-appearing in no acute distress.  Abdominal exam is quite reassuring.  Lab work is unremarkable.  Patient treated with p.o. Zofran recommend supportive care for likely food related illness    ____________________________________________   FINAL CLINICAL IMPRESSION(S) / ED DIAGNOSES  Final diagnoses:  Food poisoning, accidental or unintentional, initial encounter        Note:  This document was prepared using Dragon voice recognition software and may include unintentional dictation errors.    Lavonia Drafts, MD 07/08/17 2016

## 2017-11-18 DIAGNOSIS — J849 Interstitial pulmonary disease, unspecified: Secondary | ICD-10-CM | POA: Diagnosis not present

## 2017-11-18 DIAGNOSIS — M0579 Rheumatoid arthritis with rheumatoid factor of multiple sites without organ or systems involvement: Secondary | ICD-10-CM | POA: Diagnosis not present

## 2017-11-18 DIAGNOSIS — Z79899 Other long term (current) drug therapy: Secondary | ICD-10-CM | POA: Diagnosis not present

## 2017-12-01 DIAGNOSIS — M25461 Effusion, right knee: Secondary | ICD-10-CM | POA: Diagnosis not present

## 2017-12-01 DIAGNOSIS — M25561 Pain in right knee: Secondary | ICD-10-CM | POA: Diagnosis not present

## 2017-12-01 DIAGNOSIS — M1711 Unilateral primary osteoarthritis, right knee: Secondary | ICD-10-CM | POA: Diagnosis not present

## 2017-12-01 DIAGNOSIS — E669 Obesity, unspecified: Secondary | ICD-10-CM | POA: Diagnosis not present

## 2017-12-01 DIAGNOSIS — M7989 Other specified soft tissue disorders: Secondary | ICD-10-CM | POA: Diagnosis not present

## 2017-12-01 DIAGNOSIS — E119 Type 2 diabetes mellitus without complications: Secondary | ICD-10-CM | POA: Diagnosis not present

## 2017-12-01 DIAGNOSIS — J849 Interstitial pulmonary disease, unspecified: Secondary | ICD-10-CM | POA: Diagnosis not present

## 2017-12-09 DIAGNOSIS — J849 Interstitial pulmonary disease, unspecified: Secondary | ICD-10-CM | POA: Diagnosis not present

## 2018-04-12 DIAGNOSIS — M0579 Rheumatoid arthritis with rheumatoid factor of multiple sites without organ or systems involvement: Secondary | ICD-10-CM | POA: Diagnosis not present

## 2018-04-12 DIAGNOSIS — J849 Interstitial pulmonary disease, unspecified: Secondary | ICD-10-CM | POA: Diagnosis not present

## 2018-04-24 ENCOUNTER — Other Ambulatory Visit: Payer: Self-pay

## 2018-04-24 ENCOUNTER — Encounter: Payer: Self-pay | Admitting: Emergency Medicine

## 2018-04-24 ENCOUNTER — Emergency Department: Payer: Self-pay

## 2018-04-24 ENCOUNTER — Emergency Department
Admission: EM | Admit: 2018-04-24 | Discharge: 2018-04-24 | Disposition: A | Discharge status: Home / Self Care | Payer: Self-pay | Attending: Emergency Medicine | Admitting: Emergency Medicine

## 2018-04-24 DIAGNOSIS — J069 Acute upper respiratory infection, unspecified: Secondary | ICD-10-CM | POA: Insufficient documentation

## 2018-04-24 DIAGNOSIS — Z7984 Long term (current) use of oral hypoglycemic drugs: Secondary | ICD-10-CM | POA: Insufficient documentation

## 2018-04-24 DIAGNOSIS — I509 Heart failure, unspecified: Secondary | ICD-10-CM | POA: Insufficient documentation

## 2018-04-24 DIAGNOSIS — E119 Type 2 diabetes mellitus without complications: Secondary | ICD-10-CM | POA: Insufficient documentation

## 2018-04-24 DIAGNOSIS — J449 Chronic obstructive pulmonary disease, unspecified: Secondary | ICD-10-CM | POA: Insufficient documentation

## 2018-04-24 DIAGNOSIS — Z79899 Other long term (current) drug therapy: Secondary | ICD-10-CM | POA: Insufficient documentation

## 2018-04-24 LAB — INFLUENZA PANEL BY PCR (TYPE A & B)
INFLAPCR: NEGATIVE
INFLBPCR: NEGATIVE

## 2018-04-24 MED ORDER — BENZONATATE 100 MG PO CAPS: 100.0000 mg | ORAL_CAPSULE | Freq: Three times a day (TID) | ORAL | 0 refills | Status: DC | PRN

## 2018-04-24 MED ORDER — ACETAMINOPHEN 500 MG PO TABS: 500.0000 mg | ORAL_TABLET | Freq: Four times a day (QID) | ORAL | 0 refills | Status: DC | PRN

## 2018-04-24 NOTE — ED Triage Notes (Signed)
Pt arrives POV and ambulatory to triage with c/o cough and congestion since yesterday. Pt states that she has had nasal drainage. Pt is in NAD.

## 2018-04-24 NOTE — ED Provider Notes (Signed)
[facility] Emergency Department Provider Note  ____________________________________________  Time seen: Approximately 7:34 AM  I have reviewed the triage vital signs and the nursing notes.   HISTORY  Chief Complaint Nasal Congestion    HPI Amber Aguilar is a 63 y.o. female that presents emergency department for evaluation of chills, headache, nasal congestion, productive cough with phlegm for 2 days.  Both grandchildren were diagnosed with influenza 2 weeks ago.  Patient has had no recent travel.  No known contacts with coronavirus.  Patient uses an inhaler for COPD.  She has not had any difficulty breathing or chest pain.  She has not had taken her temperature but has felt hot and cold.  Overall she feels well.  She does not smoke.  Patient works in [employer] and came to the emergency department to know whether she should return to work.  No vomiting, diarrhea.   Past Medical History:  Diagnosis Date  . Arthritis    rheumatoid  . CHF (congestive heart failure) ([place])   . COPD (chronic obstructive pulmonary disease) ([place])   . Diabetes mellitus without complication ([place])     There are no active problems to display for this patient.   Past Surgical History:  Procedure Laterality Date  . CARPAL TUNNEL RELEASE      Prior to Admission medications   Medication Sig Start Date End Date Taking? Authorizing Provider  albuterol (PROVENTIL) (2.5 MG/3ML) 0.083% nebulizer solution Take 2.5 mg by nebulization every 6 (six) hours as needed for wheezing or shortness of breath.   Yes [provider]  atorvastatin (LIPITOR) 10 MG tablet Take 10 mg by mouth daily.   Yes [provider]  fluticasone (FLONASE) 50 MCG/ACT nasal spray Place into both nostrils daily.   Yes [provider]  hydrochlorothiazide (HYDRODIURIL) 25 MG tablet Take 25 mg by mouth daily.   Yes [provider]  metFORMIN (GLUCOPHAGE) 1000 MG tablet Take 1,000 mg  by mouth daily with breakfast.   Yes [provider]  mycophenolate (CELLCEPT) 500 MG tablet Take by mouth 2 (two) times daily.   Yes [provider]  omeprazole (PRILOSEC) 20 MG capsule Take 20 mg by mouth daily.   Yes [provider]  acetaminophen (TYLENOL) 500 MG tablet Take 1 tablet (500 mg total) by mouth every 6 (six) hours as needed. 04/24/18   Laban Emperor, PA-C  benzonatate (TESSALON PERLES) 100 MG capsule Take 1 capsule (100 mg total) by mouth 3 (three) times daily as needed for cough. 04/24/18 04/24/19  Laban Emperor, PA-C  ondansetron (ZOFRAN ODT) 4 MG disintegrating tablet Take 1 tablet (4 mg total) by mouth every 8 (eight) hours as needed for nausea or vomiting. 07/08/17   Lavonia Drafts, MD    Allergies Benzalkonium chloride and Neosporin [neomycin-bacitracin zn-polymyx]  No family history on file.  Social History Social History   Tobacco Use  . Smoking status: Never Smoker  . Smokeless tobacco: Never Used  Substance Use Topics  . Alcohol use: No  . Drug use: No     Review of Systems  Constitutional: Positive for chills.  Eyes: No visual changes. No discharge. ENT: Positive for congestion and rhinorrhea. Cardiovascular: No chest pain. Respiratory: Positive for cough. No SOB. Gastrointestinal: No abdominal pain.  No nausea, no vomiting.  No diarrhea.  No constipation. Musculoskeletal: Negative for musculoskeletal pain. Skin: Negative for rash, abrasions, lacerations, ecchymosis. Neurological: Negative for headaches.   ____________________________________________   PHYSICAL EXAM:  VITAL SIGNS: ED  Triage Vitals  Enc Vitals Group     BP 04/24/18 0533 (!) 118/49     Pulse Rate 04/24/18 0533 88     Resp 04/24/18 0533 18     Temp 04/24/18 0533 98.6 F (37 C)     Temp Source 04/24/18 0533 Oral     SpO2 04/24/18 0533 98 %     Weight 04/24/18 0530 187 lb (84.8 kg)     Height 04/24/18 0530 5\' 2"  (1.575 m)     Head Circumference --       Peak Flow --      Pain Score 04/24/18 0530 8     Pain Loc --      Pain Edu? --      Excl. in [place]? --      Constitutional: Alert and oriented. Well appearing and in no acute distress. Eyes: Conjunctivae are normal. PERRL. EOMI. No discharge. Head: Atraumatic. ENT: No frontal and maxillary sinus tenderness.      Ears: Tympanic membranes pearly gray with good landmarks. No discharge.      Nose: Mild congestion/rhinnorhea.      Mouth/Throat: Mucous membranes are moist. Oropharynx non-erythematous. Tonsils not enlarged. No exudates. Uvula midline. Neck: No stridor.   Hematological/Lymphatic/Immunilogical: No cervical lymphadenopathy. Cardiovascular: Normal rate, regular rhythm.  Good peripheral circulation. Respiratory: Normal respiratory effort without tachypnea or retractions. Lungs CTAB. Good air entry to the bases with no decreased or absent breath sounds. Gastrointestinal: Bowel sounds 4 quadrants. Soft and nontender to palpation. No guarding or rigidity. No palpable masses. No distention. Musculoskeletal: Full range of motion to all extremities. No gross deformities appreciated. Neurologic:  Normal speech and language. No gross focal neurologic deficits are appreciated.  Skin:  Skin is warm, dry and intact. No rash noted. Psychiatric: Mood and affect are normal. Speech and behavior are normal. Patient exhibits appropriate insight and judgement.   ____________________________________________   LABS (all labs ordered are listed, but only abnormal results are displayed)  Labs Reviewed  INFLUENZA PANEL BY PCR (TYPE A & B)   ____________________________________________  EKG   ____________________________________________  RADIOLOGY Robinette Haines, personally viewed and evaluated these images (plain radiographs) as part of my medical decision making, as well as reviewing the written report by the radiologist.  Dg Chest 2 View  Result Date: 04/24/2018 CLINICAL DATA:   Cough and congestion. EXAM: CHEST - 2 VIEW COMPARISON:  06/03/2015 and prior radiographs FINDINGS: The cardiomediastinal silhouette is unremarkable. Mild peribronchial thickening and minimal LEFT basilar atelectasis/scarring are unchanged. There is no evidence of focal airspace disease, pulmonary edema, suspicious pulmonary nodule/mass, pleural effusion, or pneumothorax. No acute bony abnormalities are identified. IMPRESSION: No acute cardiopulmonary disease. Electronically Signed   By: Margarette Canada M.D.   On: 04/24/2018 07:59    ____________________________________________    PROCEDURES  Procedure(s) performed:    Procedures    Medications - No data to display   ____________________________________________   INITIAL IMPRESSION / ASSESSMENT AND PLAN / ED COURSE  Pertinent labs & imaging results that were available during my care of the patient were reviewed by me and considered in my medical decision making (see chart for details).  Review of the [place] CSRS was performed in accordance of the [place] prior to dispensing any controlled drugs.   Patient's diagnosis is consistent with viral URI. Vital signs and exam are reassuring.  Influenza test is negative.  Chest x-ray negative for acute cardiopulmonary processes.  Patient has had no recent travel and no known contacts  with coronavirus.  Patient appears well and is staying well hydrated. Patient should alternate tylenol and ibuprofen for fever. Patient feels comfortable going home. Patient will be discharged home with prescriptions for tessalon perles and tylenol. Patient is to follow up with PCP as needed or otherwise directed. Patient is given ED precautions to return to the ED for any worsening or new symptoms.     ____________________________________________  FINAL CLINICAL IMPRESSION(S) / ED DIAGNOSES  Final diagnoses:  Viral upper respiratory tract infection      NEW MEDICATIONS STARTED DURING THIS VISIT:  ED Discharge  Orders         Ordered    benzonatate (TESSALON PERLES) 100 MG capsule  3 times daily PRN     04/24/18 0822    acetaminophen (TYLENOL) 500 MG tablet  Every 6 hours PRN     04/24/18 5329              This chart was dictated using voice recognition software/Dragon. Despite best efforts to proofread, errors can occur which can change the meaning. Any change was purely unintentional.    Laban Emperor, PA-C 04/24/18 1417    Harvest Dark, MD 04/24/18 1504

## 2018-04-24 NOTE — ED Notes (Signed)
Pt states that both of her grandchildren recently have been diagnosed with the flu.

## 2018-04-27 DIAGNOSIS — J849 Interstitial pulmonary disease, unspecified: Secondary | ICD-10-CM | POA: Diagnosis not present

## 2018-04-27 DIAGNOSIS — M059 Rheumatoid arthritis with rheumatoid factor, unspecified: Secondary | ICD-10-CM | POA: Diagnosis not present

## 2018-05-10 DIAGNOSIS — E119 Type 2 diabetes mellitus without complications: Secondary | ICD-10-CM | POA: Diagnosis not present

## 2018-05-10 DIAGNOSIS — J849 Interstitial pulmonary disease, unspecified: Secondary | ICD-10-CM | POA: Diagnosis not present

## 2018-06-01 DIAGNOSIS — M0579 Rheumatoid arthritis with rheumatoid factor of multiple sites without organ or systems involvement: Secondary | ICD-10-CM | POA: Diagnosis not present

## 2018-06-01 DIAGNOSIS — M358 Other specified systemic involvement of connective tissue: Secondary | ICD-10-CM | POA: Diagnosis not present

## 2018-06-01 DIAGNOSIS — J8489 Other specified interstitial pulmonary diseases: Secondary | ICD-10-CM | POA: Diagnosis not present

## 2018-07-16 DIAGNOSIS — Z7189 Other specified counseling: Secondary | ICD-10-CM | POA: Diagnosis not present

## 2018-08-16 DIAGNOSIS — M059 Rheumatoid arthritis with rheumatoid factor, unspecified: Secondary | ICD-10-CM | POA: Diagnosis not present

## 2018-08-16 DIAGNOSIS — J849 Interstitial pulmonary disease, unspecified: Secondary | ICD-10-CM | POA: Diagnosis not present

## 2018-09-24 DIAGNOSIS — J849 Interstitial pulmonary disease, unspecified: Secondary | ICD-10-CM | POA: Diagnosis not present

## 2018-09-24 DIAGNOSIS — M0579 Rheumatoid arthritis with rheumatoid factor of multiple sites without organ or systems involvement: Secondary | ICD-10-CM | POA: Diagnosis not present

## 2018-10-08 DIAGNOSIS — M0579 Rheumatoid arthritis with rheumatoid factor of multiple sites without organ or systems involvement: Secondary | ICD-10-CM | POA: Diagnosis not present

## 2018-10-08 DIAGNOSIS — J849 Interstitial pulmonary disease, unspecified: Secondary | ICD-10-CM | POA: Diagnosis not present

## 2018-12-13 DIAGNOSIS — Z6836 Body mass index (BMI) 36.0-36.9, adult: Secondary | ICD-10-CM | POA: Diagnosis not present

## 2018-12-13 DIAGNOSIS — J301 Allergic rhinitis due to pollen: Secondary | ICD-10-CM | POA: Diagnosis not present

## 2018-12-13 DIAGNOSIS — J849 Interstitial pulmonary disease, unspecified: Secondary | ICD-10-CM | POA: Diagnosis not present

## 2018-12-14 DIAGNOSIS — S50861A Insect bite (nonvenomous) of right forearm, initial encounter: Secondary | ICD-10-CM | POA: Diagnosis not present

## 2018-12-14 DIAGNOSIS — D492 Neoplasm of unspecified behavior of bone, soft tissue, and skin: Secondary | ICD-10-CM | POA: Diagnosis not present

## 2018-12-14 DIAGNOSIS — W57XXXA Bitten or stung by nonvenomous insect and other nonvenomous arthropods, initial encounter: Secondary | ICD-10-CM | POA: Diagnosis not present

## 2018-12-14 DIAGNOSIS — L282 Other prurigo: Secondary | ICD-10-CM | POA: Diagnosis not present

## 2018-12-30 DIAGNOSIS — J301 Allergic rhinitis due to pollen: Secondary | ICD-10-CM | POA: Diagnosis not present

## 2018-12-30 DIAGNOSIS — M359 Systemic involvement of connective tissue, unspecified: Secondary | ICD-10-CM | POA: Diagnosis not present

## 2018-12-30 DIAGNOSIS — G629 Polyneuropathy, unspecified: Secondary | ICD-10-CM | POA: Diagnosis not present

## 2018-12-30 DIAGNOSIS — E119 Type 2 diabetes mellitus without complications: Secondary | ICD-10-CM | POA: Diagnosis not present

## 2018-12-30 DIAGNOSIS — M0579 Rheumatoid arthritis with rheumatoid factor of multiple sites without organ or systems involvement: Secondary | ICD-10-CM | POA: Diagnosis not present

## 2018-12-30 DIAGNOSIS — J8489 Other specified interstitial pulmonary diseases: Secondary | ICD-10-CM | POA: Diagnosis not present

## 2019-01-04 DIAGNOSIS — Z20828 Contact with and (suspected) exposure to other viral communicable diseases: Secondary | ICD-10-CM | POA: Diagnosis not present

## 2019-01-04 DIAGNOSIS — G629 Polyneuropathy, unspecified: Secondary | ICD-10-CM | POA: Diagnosis not present

## 2019-01-07 DIAGNOSIS — U071 COVID-19: Secondary | ICD-10-CM | POA: Diagnosis not present

## 2019-01-13 ENCOUNTER — Emergency Department: Payer: 59

## 2019-01-13 ENCOUNTER — Other Ambulatory Visit: Payer: Self-pay

## 2019-01-13 DIAGNOSIS — U071 COVID-19: Principal | ICD-10-CM | POA: Diagnosis present

## 2019-01-13 DIAGNOSIS — Z7984 Long term (current) use of oral hypoglycemic drugs: Secondary | ICD-10-CM

## 2019-01-13 DIAGNOSIS — I509 Heart failure, unspecified: Secondary | ICD-10-CM | POA: Diagnosis present

## 2019-01-13 DIAGNOSIS — R0902 Hypoxemia: Secondary | ICD-10-CM | POA: Diagnosis not present

## 2019-01-13 DIAGNOSIS — E876 Hypokalemia: Secondary | ICD-10-CM | POA: Diagnosis not present

## 2019-01-13 DIAGNOSIS — E119 Type 2 diabetes mellitus without complications: Secondary | ICD-10-CM | POA: Diagnosis not present

## 2019-01-13 DIAGNOSIS — Z888 Allergy status to other drugs, medicaments and biological substances status: Secondary | ICD-10-CM

## 2019-01-13 DIAGNOSIS — Z79899 Other long term (current) drug therapy: Secondary | ICD-10-CM

## 2019-01-13 DIAGNOSIS — J449 Chronic obstructive pulmonary disease, unspecified: Secondary | ICD-10-CM | POA: Diagnosis present

## 2019-01-13 DIAGNOSIS — I110 Hypertensive heart disease with heart failure: Secondary | ICD-10-CM | POA: Diagnosis present

## 2019-01-13 DIAGNOSIS — E785 Hyperlipidemia, unspecified: Secondary | ICD-10-CM | POA: Diagnosis not present

## 2019-01-13 DIAGNOSIS — Z883 Allergy status to other anti-infective agents status: Secondary | ICD-10-CM

## 2019-01-13 DIAGNOSIS — R0602 Shortness of breath: Secondary | ICD-10-CM | POA: Diagnosis not present

## 2019-01-13 DIAGNOSIS — J9601 Acute respiratory failure with hypoxia: Secondary | ICD-10-CM | POA: Diagnosis present

## 2019-01-13 DIAGNOSIS — M069 Rheumatoid arthritis, unspecified: Secondary | ICD-10-CM | POA: Diagnosis present

## 2019-01-13 DIAGNOSIS — Z6838 Body mass index (BMI) 38.0-38.9, adult: Secondary | ICD-10-CM

## 2019-01-13 DIAGNOSIS — K219 Gastro-esophageal reflux disease without esophagitis: Secondary | ICD-10-CM | POA: Diagnosis not present

## 2019-01-13 DIAGNOSIS — E669 Obesity, unspecified: Secondary | ICD-10-CM | POA: Diagnosis present

## 2019-01-13 LAB — PROTIME-INR
INR: 0.9 (ref 0.8–1.2)
Prothrombin Time: 12.5 seconds (ref 11.4–15.2)

## 2019-01-13 LAB — CBC WITH DIFFERENTIAL/PLATELET
Abs Immature Granulocytes: 0.02 10*3/uL (ref 0.00–0.07)
Basophils Absolute: 0 10*3/uL (ref 0.0–0.1)
Basophils Relative: 0 %
Eosinophils Absolute: 0.1 10*3/uL (ref 0.0–0.5)
Eosinophils Relative: 1 %
HCT: 39.8 % (ref 36.0–46.0)
Hemoglobin: 12.9 g/dL (ref 12.0–15.0)
Immature Granulocytes: 0 %
Lymphocytes Relative: 14 %
Lymphs Abs: 1.1 10*3/uL (ref 0.7–4.0)
MCH: 26 pg (ref 26.0–34.0)
MCHC: 32.4 g/dL (ref 30.0–36.0)
MCV: 80.1 fL (ref 80.0–100.0)
Monocytes Absolute: 0.6 10*3/uL (ref 0.1–1.0)
Monocytes Relative: 8 %
Neutro Abs: 6.1 10*3/uL (ref 1.7–7.7)
Neutrophils Relative %: 77 %
Platelets: 348 10*3/uL (ref 150–400)
RBC: 4.97 MIL/uL (ref 3.87–5.11)
RDW: 13.2 % (ref 11.5–15.5)
WBC: 7.9 10*3/uL (ref 4.0–10.5)
nRBC: 0 % (ref 0.0–0.2)

## 2019-01-13 LAB — COMPREHENSIVE METABOLIC PANEL
ALT: 32 U/L (ref 0–44)
AST: 32 U/L (ref 15–41)
Albumin: 3.8 g/dL (ref 3.5–5.0)
Alkaline Phosphatase: 60 U/L (ref 38–126)
Anion gap: 10 (ref 5–15)
BUN: 12 mg/dL (ref 8–23)
CO2: 28 mmol/L (ref 22–32)
Calcium: 8.5 mg/dL — ABNORMAL LOW (ref 8.9–10.3)
Chloride: 99 mmol/L (ref 98–111)
Creatinine, Ser: 0.74 mg/dL (ref 0.44–1.00)
GFR calc Af Amer: >60 mL/min (ref >60)
GFR calc non Af Amer: >60 mL/min (ref >60)
Glucose, Bld: 185 mg/dL — ABNORMAL HIGH (ref 70–99)
Potassium: 3.2 mmol/L — ABNORMAL LOW (ref 3.5–5.1)
Sodium: 137 mmol/L (ref 135–145)
Total Bilirubin: 0.4 mg/dL (ref 0.3–1.2)
Total Protein: 7.1 g/dL (ref 6.5–8.1)

## 2019-01-13 LAB — LACTIC ACID, PLASMA: Lactic Acid, Venous: 0.9 mmol/L (ref 0.5–1.9)

## 2019-01-13 MED ORDER — ACETAMINOPHEN 500 MG PO TABS
1000.0000 mg | ORAL_TABLET | Freq: Once | ORAL | Status: AC
  Administered 2019-01-13: 1000 mg via ORAL
  Filled 2019-01-13: qty 2

## 2019-01-13 MED ORDER — ONDANSETRON 4 MG PO TBDP
ORAL_TABLET | ORAL | Status: AC
  Filled 2019-01-13: qty 1

## 2019-01-13 MED ORDER — ONDANSETRON 4 MG PO TBDP
4.0000 mg | ORAL_TABLET | Freq: Once | ORAL | Status: AC
  Administered 2019-01-13: 4 mg via ORAL

## 2019-01-13 NOTE — ED Notes (Signed)
Pt sitting in lobby in w/c with no distress noted; vs retaken and pt updated on wait time; pt given water to drink at request; pt denies c/o at present & st feeling better now after meds

## 2019-01-13 NOTE — ED Triage Notes (Signed)
Pt to the er for SOB. Pt has been dx with Covid on the 24th. Pt also has a lung condition but is unsure of the name. Pt states the SOB started yesterday. Pt is febrile.

## 2019-01-13 NOTE — ED Notes (Addendum)
One blood culture sent to lab. Pt given urine cup for when she can give sample.

## 2019-01-14 ENCOUNTER — Emergency Department: Payer: 59

## 2019-01-14 ENCOUNTER — Inpatient Hospital Stay: Payer: 59

## 2019-01-14 ENCOUNTER — Inpatient Hospital Stay
Admission: EM | Admit: 2019-01-14 | Discharge: 2019-01-15 | DRG: 177 | Disposition: A | Discharge status: Home / Self Care | Payer: 59 | Attending: Internal Medicine | Admitting: Internal Medicine

## 2019-01-14 DIAGNOSIS — J9600 Acute respiratory failure, unspecified whether with hypoxia or hypercapnia: Secondary | ICD-10-CM | POA: Diagnosis not present

## 2019-01-14 DIAGNOSIS — I10 Essential (primary) hypertension: Secondary | ICD-10-CM

## 2019-01-14 DIAGNOSIS — U071 COVID-19: Secondary | ICD-10-CM | POA: Diagnosis not present

## 2019-01-14 DIAGNOSIS — E119 Type 2 diabetes mellitus without complications: Secondary | ICD-10-CM | POA: Diagnosis present

## 2019-01-14 DIAGNOSIS — Z7984 Long term (current) use of oral hypoglycemic drugs: Secondary | ICD-10-CM | POA: Diagnosis not present

## 2019-01-14 DIAGNOSIS — E1169 Type 2 diabetes mellitus with other specified complication: Secondary | ICD-10-CM

## 2019-01-14 DIAGNOSIS — E876 Hypokalemia: Secondary | ICD-10-CM | POA: Diagnosis present

## 2019-01-14 DIAGNOSIS — I110 Hypertensive heart disease with heart failure: Secondary | ICD-10-CM | POA: Diagnosis present

## 2019-01-14 DIAGNOSIS — I509 Heart failure, unspecified: Secondary | ICD-10-CM | POA: Diagnosis present

## 2019-01-14 DIAGNOSIS — Z888 Allergy status to other drugs, medicaments and biological substances status: Secondary | ICD-10-CM | POA: Diagnosis not present

## 2019-01-14 DIAGNOSIS — Z6838 Body mass index (BMI) 38.0-38.9, adult: Secondary | ICD-10-CM | POA: Diagnosis not present

## 2019-01-14 DIAGNOSIS — R05 Cough: Secondary | ICD-10-CM

## 2019-01-14 DIAGNOSIS — M069 Rheumatoid arthritis, unspecified: Secondary | ICD-10-CM | POA: Diagnosis present

## 2019-01-14 DIAGNOSIS — J9601 Acute respiratory failure with hypoxia: Secondary | ICD-10-CM | POA: Diagnosis present

## 2019-01-14 DIAGNOSIS — E669 Obesity, unspecified: Secondary | ICD-10-CM | POA: Diagnosis present

## 2019-01-14 DIAGNOSIS — Z883 Allergy status to other anti-infective agents status: Secondary | ICD-10-CM | POA: Diagnosis not present

## 2019-01-14 DIAGNOSIS — R059 Cough, unspecified: Secondary | ICD-10-CM

## 2019-01-14 DIAGNOSIS — R0600 Dyspnea, unspecified: Secondary | ICD-10-CM | POA: Diagnosis not present

## 2019-01-14 DIAGNOSIS — E785 Hyperlipidemia, unspecified: Secondary | ICD-10-CM | POA: Diagnosis present

## 2019-01-14 DIAGNOSIS — R0602 Shortness of breath: Secondary | ICD-10-CM | POA: Diagnosis not present

## 2019-01-14 DIAGNOSIS — R0902 Hypoxemia: Secondary | ICD-10-CM

## 2019-01-14 DIAGNOSIS — Z79899 Other long term (current) drug therapy: Secondary | ICD-10-CM | POA: Diagnosis not present

## 2019-01-14 DIAGNOSIS — J449 Chronic obstructive pulmonary disease, unspecified: Secondary | ICD-10-CM | POA: Diagnosis present

## 2019-01-14 DIAGNOSIS — J189 Pneumonia, unspecified organism: Secondary | ICD-10-CM | POA: Diagnosis not present

## 2019-01-14 DIAGNOSIS — K219 Gastro-esophageal reflux disease without esophagitis: Secondary | ICD-10-CM | POA: Diagnosis present

## 2019-01-14 LAB — URINALYSIS, COMPLETE (UACMP) WITH MICROSCOPIC
Bacteria, UA: NONE SEEN
Bilirubin Urine: NEGATIVE
Glucose, UA: 50 mg/dL — AB
Hgb urine dipstick: NEGATIVE
Ketones, ur: 5 mg/dL — AB
Nitrite: NEGATIVE
Protein, ur: 30 mg/dL — AB
Specific Gravity, Urine: >1.046 — ABNORMAL HIGH (ref 1.005–1.030)
pH: 6 (ref 5.0–8.0)

## 2019-01-14 LAB — INFLUENZA PANEL BY PCR (TYPE A & B)
Influenza A By PCR: NEGATIVE
Influenza B By PCR: NEGATIVE

## 2019-01-14 LAB — CBC WITH DIFFERENTIAL/PLATELET
Abs Immature Granulocytes: 0.01 10*3/uL (ref 0.00–0.07)
Basophils Absolute: 0 10*3/uL (ref 0.0–0.1)
Basophils Relative: 0 %
Eosinophils Absolute: 0 10*3/uL (ref 0.0–0.5)
Eosinophils Relative: 0 %
HCT: 36.9 % (ref 36.0–46.0)
Hemoglobin: 12 g/dL (ref 12.0–15.0)
Immature Granulocytes: 0 %
Lymphocytes Relative: 31 %
Lymphs Abs: 0.9 10*3/uL (ref 0.7–4.0)
MCH: 25.4 pg — ABNORMAL LOW (ref 26.0–34.0)
MCHC: 32.5 g/dL (ref 30.0–36.0)
MCV: 78 fL — ABNORMAL LOW (ref 80.0–100.0)
Monocytes Absolute: 0.1 10*3/uL (ref 0.1–1.0)
Monocytes Relative: 5 %
Neutro Abs: 2 10*3/uL (ref 1.7–7.7)
Neutrophils Relative %: 64 %
Platelets: 317 10*3/uL (ref 150–400)
RBC: 4.73 MIL/uL (ref 3.87–5.11)
RDW: 13.2 % (ref 11.5–15.5)
WBC: 3.1 10*3/uL — ABNORMAL LOW (ref 4.0–10.5)
nRBC: 0 % (ref 0.0–0.2)

## 2019-01-14 LAB — LACTATE DEHYDROGENASE: LDH: 361 U/L — ABNORMAL HIGH (ref 98–192)

## 2019-01-14 LAB — GLUCOSE, CAPILLARY
Glucose-Capillary: 219 mg/dL — ABNORMAL HIGH (ref 70–99)
Glucose-Capillary: 210 mg/dL — ABNORMAL HIGH (ref 70–99)
Glucose-Capillary: 262 mg/dL — ABNORMAL HIGH (ref 70–99)
Glucose-Capillary: 215 mg/dL — ABNORMAL HIGH (ref 70–99)

## 2019-01-14 LAB — SARS CORONAVIRUS 2 (TAT 6-24 HRS): SARS Coronavirus 2: POSITIVE — AB

## 2019-01-14 LAB — FERRITIN: Ferritin: 262 ng/mL (ref 11–307)

## 2019-01-14 LAB — BLOOD GAS, VENOUS
Acid-Base Excess: 5.8 mmol/L — ABNORMAL HIGH (ref 0.0–2.0)
Bicarbonate: 30.6 mmol/L — ABNORMAL HIGH (ref 20.0–28.0)
O2 Saturation: 91.2 %
pCO2, Ven: 44 mmHg (ref 44.0–60.0)
pH, Ven: 7.45 — ABNORMAL HIGH (ref 7.250–7.430)
pO2, Ven: 57 mmHg — ABNORMAL HIGH (ref 32.0–45.0)

## 2019-01-14 LAB — BASIC METABOLIC PANEL
Anion gap: 8 (ref 5–15)
BUN: 13 mg/dL (ref 8–23)
CO2: 26 mmol/L (ref 22–32)
Calcium: 8.1 mg/dL — ABNORMAL LOW (ref 8.9–10.3)
Chloride: 101 mmol/L (ref 98–111)
Creatinine, Ser: 0.68 mg/dL (ref 0.44–1.00)
GFR calc Af Amer: >60 mL/min (ref >60)
GFR calc non Af Amer: >60 mL/min (ref >60)
Glucose, Bld: 239 mg/dL — ABNORMAL HIGH (ref 70–99)
Potassium: 4 mmol/L (ref 3.5–5.1)
Sodium: 135 mmol/L (ref 135–145)

## 2019-01-14 LAB — PROCALCITONIN: Procalcitonin: 0.15 ng/mL

## 2019-01-14 LAB — TRIGLYCERIDES: Triglycerides: 111 mg/dL (ref <150)

## 2019-01-14 LAB — FIBRIN DERIVATIVES D-DIMER (ARMC ONLY): Fibrin derivatives D-dimer (ARMC): 581.22 ng/mL (FEU) — ABNORMAL HIGH (ref 0.00–499.00)

## 2019-01-14 LAB — ABO/RH: ABO/RH(D): B POS

## 2019-01-14 LAB — FIBRINOGEN: Fibrinogen: 516 mg/dL — ABNORMAL HIGH (ref 210–475)

## 2019-01-14 LAB — HIV ANTIBODY (ROUTINE TESTING W REFLEX): HIV Screen 4th Generation wRfx: NONREACTIVE

## 2019-01-14 LAB — HEMOGLOBIN A1C
Hgb A1c MFr Bld: 8.1 % — ABNORMAL HIGH (ref 4.8–5.6)
Mean Plasma Glucose: 185.77 mg/dL

## 2019-01-14 LAB — C-REACTIVE PROTEIN: CRP: 6.2 mg/dL — ABNORMAL HIGH (ref <1.0)

## 2019-01-14 LAB — TROPONIN I (HIGH SENSITIVITY): Troponin I (High Sensitivity): 5 ng/L (ref <18)

## 2019-01-14 MED ORDER — ALBUTEROL SULFATE HFA 108 (90 BASE) MCG/ACT IN AERS
1.0000 | INHALATION_SPRAY | Freq: Four times a day (QID) | RESPIRATORY_TRACT | Status: DC | PRN
  Administered 2019-01-15: 2 via RESPIRATORY_TRACT
  Filled 2019-01-14 (×2): qty 6.7

## 2019-01-14 MED ORDER — HYDROXYZINE HCL 25 MG PO TABS
25.0000 mg | ORAL_TABLET | Freq: Four times a day (QID) | ORAL | Status: DC | PRN
  Filled 2019-01-14: qty 1

## 2019-01-14 MED ORDER — FLUTICASONE PROPIONATE 50 MCG/ACT NA SUSP
1.0000 | Freq: Every day | NASAL | Status: DC
  Filled 2019-01-14: qty 16

## 2019-01-14 MED ORDER — ONDANSETRON HCL 4 MG PO TABS: 4.0000 mg | ORAL_TABLET | Freq: Four times a day (QID) | ORAL | Status: DC | PRN

## 2019-01-14 MED ORDER — MYCOPHENOLATE MOFETIL 250 MG PO CAPS
500.0000 mg | ORAL_CAPSULE | Freq: Two times a day (BID) | ORAL | Status: DC
  Administered 2019-01-14 – 2019-01-15 (×4): 500 mg via ORAL
  Filled 2019-01-14 (×6): qty 2

## 2019-01-14 MED ORDER — SODIUM CHLORIDE 0.9 % IV SOLN
INTRAVENOUS | Status: DC
  Administered 2019-01-14 (×3): via INTRAVENOUS

## 2019-01-14 MED ORDER — ENOXAPARIN SODIUM 40 MG/0.4ML ~~LOC~~ SOLN
40.0000 mg | Freq: Two times a day (BID) | SUBCUTANEOUS | Status: DC
  Administered 2019-01-14 – 2019-01-15 (×2): 40 mg via SUBCUTANEOUS
  Filled 2019-01-14 (×2): qty 0.4

## 2019-01-14 MED ORDER — ONDANSETRON HCL 4 MG/2ML IJ SOLN: 4.0000 mg | Freq: Four times a day (QID) | INTRAMUSCULAR | Status: DC | PRN

## 2019-01-14 MED ORDER — TRAZODONE HCL 50 MG PO TABS
25.0000 mg | ORAL_TABLET | Freq: Every evening | ORAL | Status: DC | PRN
  Filled 2019-01-14: qty 0.5

## 2019-01-14 MED ORDER — VITAMIN B-12 1000 MCG PO TABS
1000.0000 ug | ORAL_TABLET | Freq: Every day | ORAL | Status: DC
  Administered 2019-01-14 – 2019-01-15 (×2): 1000 ug via ORAL
  Filled 2019-01-14 (×2): qty 1

## 2019-01-14 MED ORDER — ADULT MULTIVITAMIN W/MINERALS CH
1.0000 | ORAL_TABLET | Freq: Every day | ORAL | Status: DC
  Administered 2019-01-14 – 2019-01-15 (×2): 1 via ORAL
  Filled 2019-01-14 (×2): qty 1

## 2019-01-14 MED ORDER — IOHEXOL 350 MG/ML SOLN
80.0000 mL | Freq: Once | INTRAVENOUS | Status: AC | PRN
  Administered 2019-01-14: 80 mL via INTRAVENOUS

## 2019-01-14 MED ORDER — GUAIFENESIN-DM 100-10 MG/5ML PO SYRP
10.0000 mL | ORAL_SOLUTION | ORAL | Status: DC | PRN
  Filled 2019-01-14: qty 10

## 2019-01-14 MED ORDER — HYDROCOD POLST-CPM POLST ER 10-8 MG/5ML PO SUER
5.0000 mL | Freq: Two times a day (BID) | ORAL | Status: DC | PRN
  Administered 2019-01-14: 5 mL via ORAL
  Filled 2019-01-14: qty 5

## 2019-01-14 MED ORDER — ATORVASTATIN CALCIUM 10 MG PO TABS
10.0000 mg | ORAL_TABLET | Freq: Every day | ORAL | Status: DC
  Administered 2019-01-14 – 2019-01-15 (×2): 10 mg via ORAL
  Filled 2019-01-14 (×2): qty 1

## 2019-01-14 MED ORDER — PANTOPRAZOLE SODIUM 40 MG PO TBEC
40.0000 mg | DELAYED_RELEASE_TABLET | Freq: Every day | ORAL | Status: DC
  Administered 2019-01-14: 40 mg via ORAL
  Filled 2019-01-14: qty 1

## 2019-01-14 MED ORDER — ALBUTEROL SULFATE (2.5 MG/3ML) 0.083% IN NEBU: 2.5000 mg | INHALATION_SOLUTION | Freq: Four times a day (QID) | RESPIRATORY_TRACT | Status: DC | PRN

## 2019-01-14 MED ORDER — POTASSIUM CHLORIDE 20 MEQ PO PACK
40.0000 meq | PACK | Freq: Once | ORAL | Status: AC
  Administered 2019-01-14: 40 meq via ORAL
  Filled 2019-01-14: qty 2

## 2019-01-14 MED ORDER — DEXAMETHASONE 4 MG PO TABS
6.0000 mg | ORAL_TABLET | ORAL | Status: DC
  Administered 2019-01-14 – 2019-01-15 (×2): 6 mg via ORAL
  Filled 2019-01-14 (×2): qty 1.5

## 2019-01-14 MED ORDER — SODIUM CHLORIDE 0.9 % IV SOLN
100.0000 mg | Freq: Every day | INTRAVENOUS | Status: DC
  Administered 2019-01-15: 100 mg via INTRAVENOUS
  Filled 2019-01-14: qty 100
  Filled 2019-01-14: qty 20

## 2019-01-14 MED ORDER — VITAMIN C 500 MG PO TABS
500.0000 mg | ORAL_TABLET | Freq: Every day | ORAL | Status: DC
  Administered 2019-01-14 – 2019-01-15 (×2): 500 mg via ORAL
  Filled 2019-01-14 (×2): qty 1

## 2019-01-14 MED ORDER — HYDROCHLOROTHIAZIDE 25 MG PO TABS
25.0000 mg | ORAL_TABLET | Freq: Every day | ORAL | Status: DC
  Administered 2019-01-14 – 2019-01-15 (×2): 25 mg via ORAL
  Filled 2019-01-14 (×2): qty 1

## 2019-01-14 MED ORDER — MAGNESIUM HYDROXIDE 400 MG/5ML PO SUSP
30.0000 mL | Freq: Every day | ORAL | Status: DC | PRN
  Filled 2019-01-14: qty 30

## 2019-01-14 MED ORDER — FAMOTIDINE 20 MG PO TABS
20.0000 mg | ORAL_TABLET | Freq: Two times a day (BID) | ORAL | Status: DC
  Administered 2019-01-14 – 2019-01-15 (×4): 20 mg via ORAL
  Filled 2019-01-14 (×4): qty 1

## 2019-01-14 MED ORDER — ZINC SULFATE 220 (50 ZN) MG PO CAPS
220.0000 mg | ORAL_CAPSULE | Freq: Every day | ORAL | Status: DC
  Administered 2019-01-14 – 2019-01-15 (×2): 220 mg via ORAL
  Filled 2019-01-14 (×2): qty 1

## 2019-01-14 MED ORDER — DEXAMETHASONE SODIUM PHOSPHATE 10 MG/ML IJ SOLN
10.0000 mg | Freq: Once | INTRAMUSCULAR | Status: AC
  Administered 2019-01-14: 10 mg via INTRAVENOUS
  Filled 2019-01-14: qty 1

## 2019-01-14 MED ORDER — ENOXAPARIN SODIUM 40 MG/0.4ML ~~LOC~~ SOLN
40.0000 mg | SUBCUTANEOUS | Status: DC
  Administered 2019-01-14: 40 mg via SUBCUTANEOUS
  Filled 2019-01-14: qty 0.4

## 2019-01-14 MED ORDER — INSULIN ASPART 100 UNIT/ML ~~LOC~~ SOLN
0.0000 [IU] | Freq: Three times a day (TID) | SUBCUTANEOUS | Status: DC
  Administered 2019-01-14: 5 [IU] via SUBCUTANEOUS
  Administered 2019-01-14 (×2): 3 [IU] via SUBCUTANEOUS
  Administered 2019-01-15: 2 [IU] via SUBCUTANEOUS
  Administered 2019-01-15: 1 [IU] via SUBCUTANEOUS
  Filled 2019-01-14 (×5): qty 1

## 2019-01-14 MED ORDER — SODIUM CHLORIDE 0.9 % IV SOLN
200.0000 mg | Freq: Once | INTRAVENOUS | Status: AC
  Administered 2019-01-14: 200 mg via INTRAVENOUS
  Filled 2019-01-14: qty 40

## 2019-01-14 NOTE — ED Provider Notes (Signed)
[facility] Emergency Department Provider Note    First MD Initiated Contact with Patient 01/14/19 0103     (approximate)  I have reviewed the triage vital signs and the nursing notes.   HISTORY  Chief Complaint Shortness of Breath   HPI Amber Aguilar is a 63 y.o. female with below list of previous medical conditions including COPD congestive heart failure, diabetes mellitus and recently diagnosed COVID-19 on 01/04/2019 presents to the emergency department with 1 day history of progressive dyspnea and cough.  Patient noted to be febrile on presentation temperature 103.1.  Patient's oxygen saturation 88% on room air at rest        Past Medical History:  Diagnosis Date  . Arthritis    rheumatoid  . CHF (congestive heart failure) ([place])   . COPD (chronic obstructive pulmonary disease) ([place])   . Diabetes mellitus without complication [facility])     Patient Active Problem List   Diagnosis Date Noted  . Acute respiratory failure due to COVID-19 [facility]) 01/14/2019    Past Surgical History:  Procedure Laterality Date  . CARPAL TUNNEL RELEASE      Prior to Admission medications   Medication Sig Start Date End Date Taking? Authorizing Provider  acetaminophen (TYLENOL) 500 MG tablet Take 1 tablet (500 mg total) by mouth every 6 (six) hours as needed. 04/24/18   Laban Emperor, PA-C  albuterol (PROVENTIL) (2.5 MG/3ML) 0.083% nebulizer solution Take 2.5 mg by nebulization every 6 (six) hours as needed for wheezing or shortness of breath.    [provider]  atorvastatin (LIPITOR) 10 MG tablet Take 10 mg by mouth daily.    [provider]  benzonatate (TESSALON PERLES) 100 MG capsule Take 1 capsule (100 mg total) by mouth 3 (three) times daily as needed for cough. 04/24/18 04/24/19  Laban Emperor, PA-C  fluticasone (FLONASE) 50 MCG/ACT nasal spray Place into both nostrils daily.    [provider]  hydrochlorothiazide (HYDRODIURIL) 25 MG  tablet Take 25 mg by mouth daily.    [provider]  metFORMIN (GLUCOPHAGE) 1000 MG tablet Take 1,000 mg by mouth daily with breakfast.    [provider]  mycophenolate (CELLCEPT) 500 MG tablet Take by mouth 2 (two) times daily.    [provider]  omeprazole (PRILOSEC) 20 MG capsule Take 20 mg by mouth daily.    [provider]  ondansetron (ZOFRAN ODT) 4 MG disintegrating tablet Take 1 tablet (4 mg total) by mouth every 8 (eight) hours as needed for nausea or vomiting. 07/08/17   Lavonia Drafts, MD    Allergies Benzalkonium chloride and Neosporin [neomycin-bacitracin zn-polymyx]  No family history on file.  Social History Social History   Tobacco Use  . Smoking status: Never Smoker  . Smokeless tobacco: Never Used  Substance Use Topics  . Alcohol use: No  . Drug use: No    Review of Systems Constitutional: No fever/chills Eyes: No visual changes. ENT: No sore throat. Cardiovascular: Denies chest pain. Respiratory: Positive for shortness of breath. Gastrointestinal: No abdominal pain.  No nausea, no vomiting.  No diarrhea.  No constipation. Genitourinary: Negative for dysuria. Musculoskeletal: Negative for neck pain.  Negative for back pain. Integumentary: Negative for rash. Neurological: Negative for headaches, focal weakness or numbness.  ____________________________________________   PHYSICAL EXAM:  VITAL SIGNS: ED Triage Vitals  Enc Vitals Group     BP 01/13/19 2015 (!) 109/38     Pulse Rate 01/13/19 2015 (!) 103  Resp 01/13/19 2015 20     Temp 01/13/19 2015 (!) 103.1 F (39.5 C)     Temp Source 01/13/19 2015 Oral     SpO2 01/13/19 2015 93 %     Weight 01/13/19 2016 85.7 kg (189 lb)     Height 01/13/19 2016 1.676 m (5\' 6" )     Head Circumference --      Peak Flow --      Pain Score 01/13/19 2016 0     Pain Loc --      Pain Edu? --      Excl. in [place]? --     Constitutional: Alert and oriented.  Eyes: Conjunctivae  are normal.  Head: Atraumatic. Mouth/Throat: Patient is wearing a mask. Neck: No stridor.  No meningeal signs.   Cardiovascular: Normal rate, regular rhythm. Good peripheral circulation. Grossly normal heart sounds. Respiratory: Normal respiratory effort.  No retractions.  Diffuse rhonchi Gastrointestinal: Soft and nontender. No distention.  Musculoskeletal: No lower extremity tenderness nor edema. No gross deformities of extremities. Neurologic:  Normal speech and language. No gross focal neurologic deficits are appreciated.  Skin:  Skin is warm, dry and intact. Psychiatric: Mood and affect are normal. Speech and behavior are normal.  ____________________________________________   LABS (all labs ordered are listed, but only abnormal results are displayed)  Labs Reviewed  COMPREHENSIVE METABOLIC PANEL - Abnormal; Notable for the following components:      Result Value   Potassium 3.2 (*)    Glucose, Bld 185 (*)    Calcium 8.5 (*)    All other components within normal limits  CULTURE, BLOOD (ROUTINE X 2)  LACTIC ACID, PLASMA  CBC WITH DIFFERENTIAL/PLATELET  PROTIME-INR  URINALYSIS, COMPLETE (UACMP) WITH MICROSCOPIC  BLOOD GAS, VENOUS  PROCALCITONIN  LACTATE DEHYDROGENASE  FERRITIN  TRIGLYCERIDES  FIBRIN DERIVATIVES D-DIMER ([facility] ONLY)  FIBRINOGEN  C-REACTIVE PROTEIN  TROPONIN I (HIGH SENSITIVITY)   ____________________________________________  EKG  ED ECG REPORT I, Marlin N Edwen Mclester, the attending physician, personally viewed and interpreted this ECG.   Date: 01/13/2019  EKG Time: 8:51 PM  Rate: 100  Rhythm: Normal sinus rhythm  Axis: Normal  Intervals: Normal  ST&T Change: None  ____________________________________________  RADIOLOGY I, [place] Cindy Fullman, personally viewed and evaluated these images (plain radiographs) as part of my medical decision making, as well as reviewing the written report by the radiologist.  ED MD interpretation: Borderline heart size  vascular congestion bibasilar atelectasis with small effusion on chest x-ray interpretation per radiologist.  Official radiology report(s): Dg Chest 1 View  Result Date: 01/14/2019 CLINICAL DATA:  Shortness of breath EXAM: CHEST  1 VIEW COMPARISON:  04/24/2010 FINDINGS: Heart is borderline in size. Mild vascular congestion. Bibasilar opacities, likely atelectasis. Small effusions. No acute bony abnormality. IMPRESSION: Borderline heart size, vascular congestion. Bibasilar atelectasis with small effusions. Electronically Signed   By: Rolm Baptise M.D.   On: 01/14/2019 01:17      Procedures   ____________________________________________   INITIAL IMPRESSION / MDM / ASSESSMENT AND PLAN / ED COURSE  As part of my medical decision making, I reviewed the following data within the electronic MEDICAL RECORD NUMBER   63 year old female presented with above-stated history and physical exam secondary to COVID-19 infection with hypoxia.  3 L oxygen via nasal cannula applied with improvement of saturation to currently 99%.  Patient given Decadron 10 mg IV.  Patient discussed with Dr.Mansy for hospital admission for further evaluation and management.  ____________________________________________  FINAL CLINICAL IMPRESSION(S) / ED  DIAGNOSES  Final diagnoses:  [id number] virus infection  Hypoxia     MEDICATIONS GIVEN DURING THIS VISIT:  Medications  ondansetron (ZOFRAN-ODT) 4 MG disintegrating tablet (  Not Given 01/13/19 2156)  acetaminophen (TYLENOL) tablet 1,000 mg (1,000 mg Oral Given 01/13/19 2102)  ondansetron (ZOFRAN-ODT) disintegrating tablet 4 mg (4 mg Oral Given 01/13/19 2103)  dexamethasone (DECADRON) injection 10 mg (10 mg Intravenous Given 01/14/19 0130)     ED Discharge Orders    None      *Please note:  Amber Aguilar was evaluated in Emergency Department on 01/14/2019 for the symptoms described in the history of present illness. She was evaluated in the context of the global  COVID-19 pandemic, which necessitated consideration that the patient might be at risk for infection with the SARS-CoV-2 virus that causes COVID-19. Institutional protocols and algorithms that pertain to the evaluation of patients at risk for COVID-19 are in a state of rapid change based on information released by regulatory bodies including the CDC and federal and state organizations. These policies and algorithms were followed during the patient's care in the ED.  Some ED evaluations and interventions may be delayed as a result of limited staffing during the pandemic.*  Note:  This document was prepared using Dragon voice recognition software and may include unintentional dictation errors.   Gregor Hams, MD 01/14/19 [phone]

## 2019-01-14 NOTE — ED Notes (Signed)
Admitting MD at bedside at this time.

## 2019-01-14 NOTE — Progress Notes (Signed)
Patient admitted to room 248. Alert and oriented x4, no complaints of pain or shortness of breath. Ambulated to the bathroom without any assistance. Oxygen after ambulating 92% on room air. Attempted to put patient on continuous pulse oximetry but could not due to not having the adequate equipment. Will do so once we get appropriate equipment.

## 2019-01-14 NOTE — H&P (Signed)
[place] [facility] NAME: Amber Aguilar    MR#:  [id number]  DATE OF BIRTH:  01-15-1956  DATE OF ADMISSION:  01/14/2019  PRIMARY CARE PHYSICIAN: System, Pcp Not In   REQUESTING/REFERRING PHYSICIAN: Marjean Donna, MD  CHIEF COMPLAINT:   Chief Complaint  Patient presents with  . Shortness of Breath    HISTORY OF PRESENT ILLNESS:  Amber Aguilar  is a 63 y.o. African-American female with a known history of recently diagnosed COVID-16 testing positive on 11/24, type 2 diabetes most, COPD, CHF and rheumatoid arthritis, who presented to the emergency room with acute onset of worsening dyspnea with associated cough which has been dry.  She stated that her dyspnea has gotten worse since yesterday and she developed fever with chills with a T-max of 99.9.  Her cough has been gradually improving.  She denied any generalized weakness however admits to body aches.  No nausea or vomiting or diarrhea.  She admits to loss of taste and smell which have been gradually improving.  No abdominal pain.  No bleeding diathesis.  No dysuria, oliguria or hematuria or flank pain.  Upon presentation to the emergency room, vital signs revealed a fever of 103.1 with a blood pressure 109/38 and pulse of 103 with pulse ox symmetry of 93% on room air and later dropped to 88% on room air and went up to 99% on 2 L of O2 by nasal cannula.  Labs revealed hypokalemia with potassium of 3.2 blood glucose of 185 and unremarkable CBC.  Patient had a blood culture drawn.  Portable chest x-ray showed revealed vascular congestion as well as bibasilar atelectasis with small effusions  She was given 1 g of p.o. Tylenol, 10 mg of IV Decadron, 4 mg of p.o. Zofran and 40 mEq p.o. potassium chloride.  The patient will be admitted to medical monitored isolation bed for further evaluation and management. PAST MEDICAL HISTORY:   Past Medical History:  Diagnosis Date  . Arthritis    rheumatoid  . CHF (congestive  heart failure) ([place])   . COPD (chronic obstructive pulmonary disease) ([place])   . Diabetes mellitus without complication ([place])     PAST SURGICAL HISTORY:   Past Surgical History:  Procedure Laterality Date  . CARPAL TUNNEL RELEASE      SOCIAL HISTORY:   Social History   Tobacco Use  . Smoking status: Never Smoker  . Smokeless tobacco: Never Used  Substance Use Topics  . Alcohol use: No    FAMILY HISTORY:  No family history on file.  DRUG ALLERGIES:   Allergies  Allergen Reactions  . Benzalkonium Chloride Other (See Comments)  . Neosporin [Neomycin-Bacitracin Zn-Polymyx]     REVIEW OF SYSTEMS:   ROS As per history of present illness. All pertinent systems were reviewed above. Constitutional,  HEENT, cardiovascular, respiratory, GI, GU, musculoskeletal, neuro, psychiatric, endocrine,  integumentary and hematologic systems were reviewed and are otherwise  negative/unremarkable except for positive findings mentioned above in the HPI.   MEDICATIONS AT HOME:   Prior to Admission medications   Medication Sig Start Date End Date Taking? Authorizing Provider  acetaminophen (TYLENOL) 500 MG tablet Take 1 tablet (500 mg total) by mouth every 6 (six) hours as needed. 04/24/18  Yes Laban Emperor, PA-C  albuterol (PROVENTIL) (2.5 MG/3ML) 0.083% nebulizer solution Take 2.5 mg by nebulization every 6 (six) hours as needed for wheezing or shortness of breath.   Yes [provider]  atorvastatin (LIPITOR) 10 MG tablet  Take 10 mg by mouth daily.   Yes [provider]  fluticasone (FLONASE) 50 MCG/ACT nasal spray Place 1 spray into both nostrils daily.    Yes [provider]  hydrochlorothiazide (HYDRODIURIL) 25 MG tablet Take 25 mg by mouth daily.   Yes [provider]  hydrOXYzine (ATARAX/VISTARIL) 25 MG tablet Take 25 mg by mouth every 6 (six) hours as needed.  11/16/18  Yes [provider]  metFORMIN (GLUCOPHAGE) 1000 MG tablet Take 1,000  mg by mouth daily with breakfast.   Yes [provider]  Multiple Vitamin (MULTI-VITAMIN) tablet Take 1 tablet by mouth daily.    Yes [provider]  mycophenolate (CELLCEPT) 500 MG tablet Take by mouth 2 (two) times daily.   Yes [provider]  omeprazole (PRILOSEC) 20 MG capsule Take 20 mg by mouth daily.   Yes [provider]  vitamin B-12 (CYANOCOBALAMIN) 1000 MCG tablet Take 1,000 mcg by mouth daily.    Yes [provider]      VITAL SIGNS:  Blood pressure 118/62, pulse 70, temperature 98.3 F (36.8 C), resp. rate 16, height 5\' 6"  (1.676 m), weight 85.7 kg, SpO2 99 %.  PHYSICAL EXAMINATION:  Physical Exam  GENERAL:  63 y.o.-year-old American female patient lying in the bed with mild respiratory distress with conversational dyspnea. EYES: Pupils equal, round, reactive to light and accommodation. No scleral icterus. Extraocular muscles intact.  HEENT: Head atraumatic, normocephalic. Oropharynx and nasopharynx clear.  NECK:  Supple, no jugular venous distention. No thyroid enlargement, no tenderness.  LUNGS: Diminished bibasilar breath sounds with bibasal crackles CARDIOVASCULAR: Regular rate and rhythm, S1, S2 normal. No murmurs, rubs, or gallops.  ABDOMEN: Soft, nondistended, nontender. Bowel sounds present. No organomegaly or mass.  EXTREMITIES: No pedal edema, cyanosis, or clubbing.  NEUROLOGIC: Cranial nerves II through XII are intact. Muscle strength 5/5 in all extremities. Sensation intact. Gait not checked.  PSYCHIATRIC: The patient is alert and oriented x 3.  Normal affect and good eye contact. SKIN: No obvious rash, lesion, or ulcer.   LABORATORY PANEL:   CBC Recent Labs  Lab 01/13/19 2030  WBC 7.9  HGB 12.9  HCT 39.8  PLT 348   ------------------------------------------------------------------------------------------------------------------  Chemistries  Recent Labs  Lab 01/13/19 2030  NA 137  K 3.2*  CL 99   CO2 28  GLUCOSE 185*  BUN 12  CREATININE 0.74  CALCIUM 8.5*  AST 32  ALT 32  ALKPHOS 60  BILITOT 0.4   ------------------------------------------------------------------------------------------------------------------  Cardiac Enzymes No results for input(s): TROPONINI in the last 168 hours. ------------------------------------------------------------------------------------------------------------------  RADIOLOGY:  Dg Chest 1 View  Result Date: 01/14/2019 CLINICAL DATA:  Shortness of breath EXAM: CHEST  1 VIEW COMPARISON:  04/24/2010 FINDINGS: Heart is borderline in size. Mild vascular congestion. Bibasilar opacities, likely atelectasis. Small effusions. No acute bony abnormality. IMPRESSION: Borderline heart size, vascular congestion. Bibasilar atelectasis with small effusions. Electronically Signed   By: Rolm Baptise M.D.   On: 01/14/2019 01:17    IMPRESSION AND PLAN:   1.  Recent COVID-19 with currently associated fever and sepsis without severe sepsis or septic shock and hypoxemia likely associated with multifocal pneumonia.  The patient will be admitted to medically monitored isolation bed.  She will be placed on IV Rocephin and Zithromax pending blood and sputum culture.  Antitussives will be provided.  We will continue steroid therapy with IV Decadron and check a D-dimer.  Should it come back elevated we will obtain a chest CTA and place  the patient on anticoagulants.  We will place the patient on vitamin D3, vitamin C, zinc, aspirin and Pepcid.  I offered the patient convalescent plasma and this will be provided should her PCR come back positive.  O2 protocol will be followed.  She is currently on 3 L of O2 maintaining adequate O2 sats.  2.  Hypertension.  We will continue her antihypertensives.  3.  Type II diabetes mellitus.  We will place the patient on supplement coverage with NovoLog and hold her Metformin.  4.  Dyslipidemia.  We will continue statin therapy.  5.   GERD.  We will continue PPI therapy.  6.  DVT prophylaxis.  Subtenons Lovenox    All the records are reviewed and case discussed with ED provider. The plan of care was discussed in details with the patient (and family). I answered all questions. The patient agreed to proceed with the above mentioned plan. Further management will depend upon hospital course.   CODE STATUS: Full code  TOTAL TIME TAKING CARE OF THIS PATIENT: 60 minutes.    Christel Mormon M.D on 01/14/2019 at 3:34 AM  [facility]   From 7 PM-7 AM, contact night-coverage [email or web address]  CC: Primary care physician; System, Pcp Not In   Note: This dictation was prepared with Dragon dictation along with smaller phrase technology. Any transcriptional errors that result from this process are unintentional.

## 2019-01-14 NOTE — ED Notes (Signed)
O2 removed as discussed with admitting MD, pt up to the bathroom and back to bed without incident. Pt denies feeling SOB, however when placed back on O2 monitoring, pt's O2 sats noted to be 88% on RA at this time. Pt placed back on 2L via [place] at this time, O2 sats on 2L back to 97%. Will continue to monitor for further patient needs.

## 2019-01-14 NOTE — ED Notes (Signed)
Patient transported to CT 

## 2019-01-14 NOTE — ED Notes (Signed)
Medications administered per MD order. Pt given breakfast tray at this time. Pt continues to rest in bed with NAD noted at this time. Pt A&O x4. Denies further needs.

## 2019-01-14 NOTE — ED Notes (Signed)
This RN to bedside at this time, introduced self to patient. Pt resting in bed playing on phone. This RN spoke with patient and confirmed patient wish to be confidential patient and confirmed that should patient's family call to get update on patient, we would not be able to tell them she was here. Pt states understanding, with personal cell phone at this time. Pt states she does not want her coworkers to know she is here, this RN explained unable to change her name to go by her middle name but will call dietary to see how private patients are dealt with regarding names on trays with patient permission. Pt denies further needs at this time, is visualized in NAD. Call bell within reach. Will continue to monitor for further patient needs.

## 2019-01-14 NOTE — Consult Note (Signed)
Remdesivir - Pharmacy Brief Note  SARS-COV-2 RNA, PCR: Positive 01/04/2019  Presented to ED with 1 day progressive SOB and Cough. Tmax 103.1. PMH of CHF, DM, RA and COPD.   O:  ALT: 32 CXR: Bibasilar atelectasis with small effusions. SpO2: 88% on room air    A/P:  Start Remdesivir 200 mg IVPB once followed by Remdesivir 100 mg IVPB daily x 4 days.   Pernell Dupre, PharmD, BCPS Clinical Pharmacist 01/14/2019 3:48 AM

## 2019-01-14 NOTE — ED Notes (Signed)
Pt removed from O2 as discussed with admitting MD at this time.

## 2019-01-14 NOTE — Progress Notes (Signed)
PROGRESS NOTE    Amber Aguilar  [id number] DOB: 1955-06-20 DOA: 01/14/2019 PCP: System, Pcp Not In      Assessment & Plan:   Active Problems:   Acute respiratory failure due to COVID-19 [facility])   COVID19: will continue on remdesivir, steroids, vitamin c, & zinc.  Airborne & contact isolation precautions. Continue on supplemental oxygen & wean as tolerated  Acute hypoxic respiratory failure: secondary to [place]. Management as stated above  HTN:  continue on HCTZ  DM2: will continue to hold home dose of metformin. Will continue on SSI w/ accuchecks. Carb modified diet  HLD: will continue on statin  GERD: will continue on PPI  Obesity: BMI 38.6.  Would benefit from weight loss      DVT prophylaxis: lovenox Code Status: full     Consultants:   n/a   Procedures: n/a   Antimicrobials: n/a   Subjective: Patient complains of shortness of breath  Objective: Vitals:   01/14/19 1400 01/14/19 1401 01/14/19 1430 01/14/19 1622  BP: (!) 114/56  120/61 (!) 116/48  Pulse:  69 70 66  Resp: 17 (!) 24 20 18   Temp:    98.4 F (36.9 C)  TempSrc:    Oral  SpO2:  98% 100% 92%  Weight:    95.9 kg  Height:    5\' 2"  (1.575 m)    Intake/Output Summary (Last 24 hours) at 01/14/2019 1643 Last data filed at 01/14/2019 1453 Gross per 24 hour  Intake 960.73 ml  Output --  Net 960.73 ml   Filed Weights   01/13/19 2016 01/14/19 1622  Weight: 85.7 kg 95.9 kg    Examination:  General exam: Appears calm and comfortable  Respiratory system: Decreased breath sounds bilaterally.  No wheezes, rales Cardiovascular system: S1 & S2 normal.  No rubs or gallops Gastrointestinal system: Abdomen is obese, soft and nontender. Normal bowel sounds heard. Central nervous system: Alert and oriented.  Moves all 4 extremities Psychiatry: Judgement and insight appear normal.  Flat mood and affect    Data Reviewed: I have personally reviewed following labs and imaging  studies  CBC: Recent Labs  Lab 01/13/19 2030 01/14/19 0915  WBC 7.9 3.1*  NEUTROABS 6.1 2.0  HGB 12.9 12.0  HCT 39.8 36.9  MCV 80.1 78.0*  PLT 348 [id number]   Basic Metabolic Panel: Recent Labs  Lab 01/13/19 2030 01/14/19 0915  NA 137 135  K 3.2* 4.0  CL 99 101  CO2 28 26  GLUCOSE 185* 239*  BUN 12 13  CREATININE 0.74 0.68  CALCIUM 8.5* 8.1*   GFR: Estimated Creatinine Clearance: 79.7 mL/min (by C-G formula based on SCr of 0.68 mg/dL). Liver Function Tests: Recent Labs  Lab 01/13/19 2030  AST 32  ALT 32  ALKPHOS 60  BILITOT 0.4  PROT 7.1  ALBUMIN 3.8   No results for input(s): LIPASE, AMYLASE in the last 168 hours. No results for input(s): AMMONIA in the last 168 hours. Coagulation Profile: Recent Labs  Lab 01/13/19 2030  INR 0.9   Cardiac Enzymes: No results for input(s): CKTOTAL, CKMB, CKMBINDEX, TROPONINI in the last 168 hours. BNP (last 3 results) No results for input(s): PROBNP in the last 8760 hours. HbA1C: Recent Labs    01/14/19 0543  HGBA1C 8.1*   CBG: Recent Labs  Lab 01/14/19 0917 01/14/19 1308  GLUCAP 219* 210*   Lipid Profile: Recent Labs    01/14/19 0104  TRIG 111   Thyroid Function Tests: No results for input(s): TSH,  T4TOTAL, FREET4, T3FREE, THYROIDAB in the last 72 hours. Anemia Panel: Recent Labs    01/14/19 0104  FERRITIN 262   Sepsis Labs: Recent Labs  Lab 01/13/19 2030 01/14/19 0104  PROCALCITON  --  0.15  LATICACIDVEN 0.9  --     Recent Results (from the past 240 hour(s))  Culture, blood (Routine x 2)     Status: None (Preliminary result)   Collection Time: 01/13/19  8:30 PM   Specimen: BLOOD  Result Value Ref Range Status   Specimen Description BLOOD Blood Culture adequate volume  Final   Special Requests   Final    BOTTLES DRAWN AEROBIC AND ANAEROBIC LEFT ANTECUBITAL   Culture   Final    NO GROWTH < 12 HOURS Performed at [facility], [address] [place], [place] [address]    Report  Status PENDING  Incomplete         Radiology Studies: Dg Chest 1 View  Result Date: 01/14/2019 CLINICAL DATA:  Shortness of breath EXAM: CHEST  1 VIEW COMPARISON:  04/24/2010 FINDINGS: Heart is borderline in size. Mild vascular congestion. Bibasilar opacities, likely atelectasis. Small effusions. No acute bony abnormality. IMPRESSION: Borderline heart size, vascular congestion. Bibasilar atelectasis with small effusions. Electronically Signed   By: Rolm Baptise M.D.   On: 01/14/2019 01:17   Ct Angio Chest Pe W Or Wo Contrast  Result Date: 01/14/2019 CLINICAL DATA:  Progressive dyspnea and cough. Fever. Hypoxia. Positive COVID-19 test 10 days ago. EXAM: CT ANGIOGRAPHY CHEST WITH CONTRAST TECHNIQUE: Multidetector CT imaging of the chest was performed using the standard protocol during bolus administration of intravenous contrast. Multiplanar CT image reconstructions and MIPs were obtained to evaluate the vascular anatomy. CONTRAST:  36mL OMNIPAQUE IOHEXOL 350 MG/ML SOLN COMPARISON:  One-view chest x-ray 01/14/2019 FINDINGS: Cardiovascular: The heart is mildly enlarged. Aorta and great vessel origins are within normal limits. Pulmonary artery opacification is excellent. No focal filling defects are present to suggest pulmonary embolus. Mediastinum/Nodes: No significant mediastinal, hilar, or axillary adenopathy is present. Lungs/Pleura: Patchy peripheral airspace opacities are present, predominantly in the lower lobes. Finding is consistent with COVID-19 pneumonia. Broad areas are normal without evidence for edema. No significant pleural effusion is present. Upper Abdomen: Diffuse fatty infiltration of the liver is noted. No focal lesions are present. Upper abdomen is otherwise within normal limits. Musculoskeletal: No chest wall abnormality. No acute or significant osseous findings. Review of the MIP images confirms the above findings. IMPRESSION: 1. No evidence for pulmonary embolus. 2. Patchy  peripheral airspace opacities bilaterally compatible with COVID-19 pneumonia. 3. Cardiomegaly without failure. 4. Hepatic steatosis Electronically Signed   By: San Morelle M.D.   On: 01/14/2019 05:28        Scheduled Meds:  atorvastatin  10 mg Oral Daily   dexamethasone  6 mg Oral Q24H   enoxaparin (LOVENOX) injection  40 mg Subcutaneous Q12H   famotidine  20 mg Oral BID   fluticasone  1 spray Each Nare Daily   hydrochlorothiazide  25 mg Oral Daily   insulin aspart  0-9 Units Subcutaneous TID WC   multivitamin with minerals  1 tablet Oral Daily   mycophenolate  500 mg Oral BID   vitamin B-12  1,000 mcg Oral Daily   vitamin C  500 mg Oral Daily   zinc sulfate  220 mg Oral Daily   Continuous Infusions:  sodium chloride 75 mL/hr at 01/14/19 0556   [START ON 01/15/2019] remdesivir 100 mg in NS 100 mL  LOS: 0 days    Time spent: 30 minutes    Wyvonnia Dusky, MD [facility] Pager [phone]  If 7PM-7AM, please contact night-coverage [email or web address] Password [email or web address] 01/14/2019, 4:43 PM

## 2019-01-15 DIAGNOSIS — E1169 Type 2 diabetes mellitus with other specified complication: Secondary | ICD-10-CM

## 2019-01-15 DIAGNOSIS — E119 Type 2 diabetes mellitus without complications: Secondary | ICD-10-CM

## 2019-01-15 LAB — COMPREHENSIVE METABOLIC PANEL
ALT: 31 U/L (ref 0–44)
AST: 31 U/L (ref 15–41)
Albumin: 3.5 g/dL (ref 3.5–5.0)
Alkaline Phosphatase: 49 U/L (ref 38–126)
Anion gap: 10 (ref 5–15)
BUN: 17 mg/dL (ref 8–23)
CO2: 25 mmol/L (ref 22–32)
Calcium: 8.5 mg/dL — ABNORMAL LOW (ref 8.9–10.3)
Chloride: 104 mmol/L (ref 98–111)
Creatinine, Ser: 0.65 mg/dL (ref 0.44–1.00)
GFR calc Af Amer: >60 mL/min (ref >60)
GFR calc non Af Amer: >60 mL/min (ref >60)
Glucose, Bld: 116 mg/dL — ABNORMAL HIGH (ref 70–99)
Potassium: 3.6 mmol/L (ref 3.5–5.1)
Sodium: 139 mmol/L (ref 135–145)
Total Bilirubin: 0.3 mg/dL (ref 0.3–1.2)
Total Protein: 6.4 g/dL — ABNORMAL LOW (ref 6.5–8.1)

## 2019-01-15 LAB — CBC WITH DIFFERENTIAL/PLATELET
Abs Immature Granulocytes: 0.05 10*3/uL (ref 0.00–0.07)
Basophils Absolute: 0 10*3/uL (ref 0.0–0.1)
Basophils Relative: 0 %
Eosinophils Absolute: 0 10*3/uL (ref 0.0–0.5)
Eosinophils Relative: 0 %
HCT: 38.2 % (ref 36.0–46.0)
Hemoglobin: 12.4 g/dL (ref 12.0–15.0)
Immature Granulocytes: 1 %
Lymphocytes Relative: 30 %
Lymphs Abs: 2.7 10*3/uL (ref 0.7–4.0)
MCH: 26 pg (ref 26.0–34.0)
MCHC: 32.5 g/dL (ref 30.0–36.0)
MCV: 80.1 fL (ref 80.0–100.0)
Monocytes Absolute: 1.1 10*3/uL — ABNORMAL HIGH (ref 0.1–1.0)
Monocytes Relative: 12 %
Neutro Abs: 5.2 10*3/uL (ref 1.7–7.7)
Neutrophils Relative %: 57 %
Platelets: 380 10*3/uL (ref 150–400)
RBC: 4.77 MIL/uL (ref 3.87–5.11)
RDW: 13.1 % (ref 11.5–15.5)
WBC: 9 10*3/uL (ref 4.0–10.5)
nRBC: 0 % (ref 0.0–0.2)

## 2019-01-15 LAB — GLUCOSE, CAPILLARY
Glucose-Capillary: 149 mg/dL — ABNORMAL HIGH (ref 70–99)
Glucose-Capillary: 194 mg/dL — ABNORMAL HIGH (ref 70–99)

## 2019-01-15 LAB — C-REACTIVE PROTEIN: CRP: 2.9 mg/dL — ABNORMAL HIGH (ref <1.0)

## 2019-01-15 LAB — FIBRIN DERIVATIVES D-DIMER (ARMC ONLY): Fibrin derivatives D-dimer (ARMC): 505.46 ng/mL (FEU) — ABNORMAL HIGH (ref 0.00–499.00)

## 2019-01-15 LAB — FERRITIN: Ferritin: 273 ng/mL (ref 11–307)

## 2019-01-15 MED ORDER — DEXAMETHASONE 6 MG PO TABS: 6.0000 mg | ORAL_TABLET | Freq: Every day | ORAL | 0 refills | Status: AC

## 2019-01-15 NOTE — Plan of Care (Signed)
  Problem: Education: Goal: Knowledge of General Education information will improve Description: Including pain rating scale, medication(s)/side effects and non-pharmacologic comfort measures Outcome: Adequate for Discharge   Problem: Health Behavior/Discharge Planning: Goal: Ability to manage health-related needs will improve Outcome: Adequate for Discharge   Problem: Clinical Measurements: Goal: Ability to maintain clinical measurements within normal limits will improve Outcome: Adequate for Discharge Goal: Will remain free from infection Outcome: Adequate for Discharge Goal: Diagnostic test results will improve Outcome: Adequate for Discharge Goal: Respiratory complications will improve Outcome: Adequate for Discharge Goal: Cardiovascular complication will be avoided Outcome: Adequate for Discharge   Problem: Activity: Goal: Risk for activity intolerance will decrease Outcome: Adequate for Discharge   Problem: Nutrition: Goal: Adequate nutrition will be maintained Outcome: Adequate for Discharge   Problem: Coping: Goal: Level of anxiety will decrease Outcome: Adequate for Discharge   Problem: Pain Managment: Goal: General experience of comfort will improve Outcome: Adequate for Discharge   Problem: Safety: Goal: Ability to remain free from injury will improve Outcome: Adequate for Discharge

## 2019-01-15 NOTE — Discharge Summary (Signed)
Physician Discharge Summary  Amber Aguilar [id number] DOB: [birth year] DOA: 01/14/2019  PCP: System, Pcp Not In  Admit date: 01/14/2019 Discharge date: 01/15/2019  Admitted From: home Disposition:  home  Recommendations for Outpatient Follow-up:  1. Follow up with PCP in 2 weeks 2. Quarantine for 13 more days  Home Health: no Equipment/Devices:  Discharge Condition: Stable CODE STATUS: Full Diet recommendation: Heart Healthy / Carb Modified   Brief/Interim Summary: HPI was taken from Dr. Sidney Ace: Amber Aguilar  is a 63 y.o. African-American female with a known history of recently diagnosed COVID-55 testing positive on 11/24, type 2 diabetes most, COPD, CHF and rheumatoid arthritis, who presented to the emergency room with acute onset of worsening dyspnea with associated cough which has been dry.  She stated that her dyspnea has gotten worse since yesterday and she developed fever with chills with a T-max of 99.9.  Her cough has been gradually improving.  She denied any generalized weakness however admits to body aches.  No nausea or vomiting or diarrhea.  She admits to loss of taste and smell which have been gradually improving.  No abdominal pain.  No bleeding diathesis.  No dysuria, oliguria or hematuria or flank pain.  Upon presentation to the emergency room, vital signs revealed a fever of 103.1 with a blood pressure 109/38 and pulse of 103 with pulse ox symmetry of 93% on room air and later dropped to 88% on room air and went up to 99% on 2 L of O2 by nasal cannula.  Labs revealed hypokalemia with potassium of 3.2 blood glucose of 185 and unremarkable CBC.  Patient had a blood culture drawn.  Portable chest x-ray showed revealed vascular congestion as well as bibasilar atelectasis with small effusions  She was given 1 g of p.o. Tylenol, 10 mg of IV Decadron, 4 mg of p.o. Zofran and 40 mEq p.o. potassium chloride.  The patient will be admitted to medical monitored isolation bed for  further evaluation and management.  Hospital course: Patient was found to be COVID-19 positive and was treated with remdesivir, steroids, vitamin C, and zinc.  Patient did require supplemental oxygen while in the hospital but was able to be weaned off prior to discharge.  Vitals are stable at the time of discharge.  Patient understands that she will quarantine for total of 14 days.  Discharge Diagnoses:  Active Problems:   Acute respiratory failure due to COVID-19 [facility])  COVID19: will continue on remdesivir, steroids, vitamin c, & zinc.  Airborne & contact isolation precautions.  Weaned off of supplemental oxygen  Acute hypoxic respiratory failure: secondary to [place].  Resolved  HTN:  continue on HCTZ  DM2: will continue to hold home dose of metformin. Will continue on SSI w/ accuchecks. Carb modified diet  HLD: will continue on statin  GERD: will continue on PPI  Obesity: BMI 38.6.  Would benefit from weight loss   Discharge Instructions  Discharge Instructions    Diet - low sodium heart healthy   Complete by: As directed    Diet Carb Modified   Complete by: As directed    Discharge instructions   Complete by: As directed    F/u w/ PCP in 2 weeks; quarantine for 13 more days   Increase activity slowly   Complete by: As directed      Allergies as of 01/15/2019      Reactions   Benzalkonium Chloride Other (See Comments)   Neosporin [neomycin-bacitracin Zn-polymyx]  Medication List    TAKE these medications   acetaminophen 500 MG tablet Commonly known as: TYLENOL Take 1 tablet (500 mg total) by mouth every 6 (six) hours as needed.   albuterol (2.5 MG/3ML) 0.083% nebulizer solution Commonly known as: PROVENTIL Take 2.5 mg by nebulization every 6 (six) hours as needed for wheezing or shortness of breath.   atorvastatin 10 MG tablet Commonly known as: LIPITOR Take 10 mg by mouth daily.   dexamethasone 6 MG tablet Commonly known as: DECADRON Take 1  tablet (6 mg total) by mouth daily for 5 days.   fluticasone 50 MCG/ACT nasal spray Commonly known as: FLONASE Place 1 spray into both nostrils daily.   hydrochlorothiazide 25 MG tablet Commonly known as: HYDRODIURIL Take 25 mg by mouth daily.   hydrOXYzine 25 MG tablet Commonly known as: ATARAX/VISTARIL Take 25 mg by mouth every 6 (six) hours as needed.   metFORMIN 1000 MG tablet Commonly known as: GLUCOPHAGE Take 1,000 mg by mouth daily with breakfast.   Multi-Vitamin tablet Take 1 tablet by mouth daily.   mycophenolate 500 MG tablet Commonly known as: CELLCEPT Take by mouth 2 (two) times daily.   omeprazole 20 MG capsule Commonly known as: PRILOSEC Take 20 mg by mouth daily.   vitamin B-12 1000 MCG tablet Commonly known as: CYANOCOBALAMIN Take 1,000 mcg by mouth daily.       Allergies  Allergen Reactions  . Benzalkonium Chloride Other (See Comments)  . Neosporin [Neomycin-Bacitracin Zn-Polymyx]     Consultations:  n/a   Procedures/Studies: Dg Chest 1 View  Result Date: 01/14/2019 CLINICAL DATA:  Shortness of breath EXAM: CHEST  1 VIEW COMPARISON:  04/24/2010 FINDINGS: Heart is borderline in size. Mild vascular congestion. Bibasilar opacities, likely atelectasis. Small effusions. No acute bony abnormality. IMPRESSION: Borderline heart size, vascular congestion. Bibasilar atelectasis with small effusions. Electronically Signed   By: Rolm Baptise M.D.   On: 01/14/2019 01:17   Ct Angio Chest Pe W Or Wo Contrast  Result Date: 01/14/2019 CLINICAL DATA:  Progressive dyspnea and cough. Fever. Hypoxia. Positive COVID-19 test 10 days ago. EXAM: CT ANGIOGRAPHY CHEST WITH CONTRAST TECHNIQUE: Multidetector CT imaging of the chest was performed using the standard protocol during bolus administration of intravenous contrast. Multiplanar CT image reconstructions and MIPs were obtained to evaluate the vascular anatomy. CONTRAST:  19mL OMNIPAQUE IOHEXOL 350 MG/ML SOLN  COMPARISON:  One-view chest x-ray 01/14/2019 FINDINGS: Cardiovascular: The heart is mildly enlarged. Aorta and great vessel origins are within normal limits. Pulmonary artery opacification is excellent. No focal filling defects are present to suggest pulmonary embolus. Mediastinum/Nodes: No significant mediastinal, hilar, or axillary adenopathy is present. Lungs/Pleura: Patchy peripheral airspace opacities are present, predominantly in the lower lobes. Finding is consistent with COVID-19 pneumonia. Broad areas are normal without evidence for edema. No significant pleural effusion is present. Upper Abdomen: Diffuse fatty infiltration of the liver is noted. No focal lesions are present. Upper abdomen is otherwise within normal limits. Musculoskeletal: No chest wall abnormality. No acute or significant osseous findings. Review of the MIP images confirms the above findings. IMPRESSION: 1. No evidence for pulmonary embolus. 2. Patchy peripheral airspace opacities bilaterally compatible with COVID-19 pneumonia. 3. Cardiomegaly without failure. 4. Hepatic steatosis Electronically Signed   By: San Morelle M.D.   On: 01/14/2019 05:28      Subjective: Patient complains of cough  Discharge Exam: Vitals:   01/15/19 0858 01/15/19 1351  BP: (!) 132/24 (!) 124/58  Pulse: 73 77  Resp: 18  Temp: 98.8 F (37.1 C)   SpO2: 97%    Vitals:   01/14/19 2009 01/15/19 0334 01/15/19 0858 01/15/19 1351  BP: (!) 118/53 108/61 (!) 132/24 (!) 124/58  Pulse: 70 65 73 77  Resp:   18   Temp: 98.1 F (36.7 C) 98.3 F (36.8 C) 98.8 F (37.1 C)   TempSrc: Oral Oral Oral   SpO2: 93% 100% 97%   Weight:  90.4 kg    Height:        General: Pt is alert, awake, not in acute distress Cardiovascular:  S1/S2 normal, no rubs, no gallops Respiratory: decreased breath sounds b/l, no wheezing, no rales Abdominal: Soft, NT, ND, bowel sounds + Extremities: no edema, no cyanosis    The results of significant  diagnostics from this hospitalization (including imaging, microbiology, ancillary and laboratory) are listed below for reference.     Microbiology: Recent Results (from the past 240 hour(s))  Culture, blood (Routine x 2)     Status: None (Preliminary result)   Collection Time: 01/13/19  8:30 PM   Specimen: BLOOD  Result Value Ref Range Status   Specimen Description BLOOD Blood Culture adequate volume  Final   Special Requests   Final    BOTTLES DRAWN AEROBIC AND ANAEROBIC LEFT ANTECUBITAL   Culture   Final    NO GROWTH 2 DAYS Performed at [facility], [address], [place], [place] [address]    Report Status PENDING  Incomplete  SARS CORONAVIRUS 2 (TAT 6-24 HRS) Nasopharyngeal Nasopharyngeal Swab     Status: Abnormal   Collection Time: 01/14/19  4:35 AM   Specimen: Nasopharyngeal Swab  Result Value Ref Range Status   SARS Coronavirus 2 POSITIVE (A) NEGATIVE Final    Comment: RESULT CALLED TO, READ BACK BY AND VERIFIED WITH: T.TECOLA RN 1933 01/14/2019 MCCORMICK K (NOTE) SARS-CoV-2 target nucleic acids are DETECTED. The SARS-CoV-2 RNA is generally detectable in upper and lower respiratory specimens during the acute phase of infection. Positive results are indicative of the presence of SARS-CoV-2 RNA. Clinical correlation with patient history and other diagnostic information is  necessary to determine patient infection status. Positive results do not rule out bacterial infection or co-infection with other viruses.  The expected result is Negative. Fact Sheet for Patients: SugarRoll.be Fact Sheet for Healthcare Providers: https://www.woods-mathews.com/ This test is not yet approved or cleared by the [place] FDA and  has been authorized for detection and/or diagnosis of SARS-CoV-2 by FDA under an Emergency Use Authorization (EUA). This EUA will remain  in effect (meaning this test can be used) for  the duration of  the COVID-19 declaration under Section 564(b)(1) of the Act, 21 U.S.C. section 360bbb-3(b)(1), unless the authorization is terminated or revoked sooner. Performed at [facility], [place] [address], [place], [place] [address]      Labs: BNP (last 3 results) No results for input(s): BNP in the last 8760 hours. Basic Metabolic Panel: Recent Labs  Lab 01/13/19 2030 01/14/19 0915 01/15/19 0605  NA 137 135 139  K 3.2* 4.0 3.6  CL 99 101 104  CO2 28 26 25   GLUCOSE 185* 239* 116*  BUN 12 13 17   CREATININE 0.74 0.68 0.65  CALCIUM 8.5* 8.1* 8.5*   Liver Function Tests: Recent Labs  Lab 01/13/19 2030 01/15/19 0605  AST 32 31  ALT 32 31  ALKPHOS 60 49  BILITOT 0.4 0.3  PROT 7.1 6.4*  ALBUMIN 3.8 3.5   No results for input(s): LIPASE,  AMYLASE in the last 168 hours. No results for input(s): AMMONIA in the last 168 hours. CBC: Recent Labs  Lab 01/13/19 2030 01/14/19 0915 01/15/19 0605  WBC 7.9 3.1* 9.0  NEUTROABS 6.1 2.0 5.2  HGB 12.9 12.0 12.4  HCT 39.8 36.9 38.2  MCV 80.1 78.0* 80.1  PLT 348 317 380   Cardiac Enzymes: No results for input(s): CKTOTAL, CKMB, CKMBINDEX, TROPONINI in the last 168 hours. BNP: Invalid input(s): POCBNP CBG: Recent Labs  Lab 01/14/19 1308 01/14/19 1642 01/14/19 2009 01/15/19 0850 01/15/19 1223  GLUCAP 210* 262* 215* 149* 194*   D-Dimer No results for input(s): DDIMER in the last 72 hours. Hgb A1c Recent Labs    01/14/19 0543  HGBA1C 8.1*   Lipid Profile Recent Labs    01/14/19 0104  TRIG 111   Thyroid function studies No results for input(s): TSH, T4TOTAL, T3FREE, THYROIDAB in the last 72 hours.  Invalid input(s): FREET3 Anemia work up Recent Labs    01/14/19 0104 01/15/19 0605  FERRITIN 262 273   Urinalysis    Component Value Date/Time   COLORURINE YELLOW (A) 01/14/2019 0540   APPEARANCEUR HAZY (A) 01/14/2019 0540   APPEARANCEUR Clear 10/29/2013 1941   LABSPEC >1.046 (H) 01/14/2019 0540   LABSPEC  1.018 10/29/2013 1941   PHURINE 6.0 01/14/2019 0540   GLUCOSEU 50 (A) 01/14/2019 0540   GLUCOSEU Negative 10/29/2013 1941   HGBUR NEGATIVE 01/14/2019 0540   BILIRUBINUR NEGATIVE 01/14/2019 0540   BILIRUBINUR Negative 10/29/2013 1941   KETONESUR 5 (A) 01/14/2019 0540   PROTEINUR 30 (A) 01/14/2019 0540   NITRITE NEGATIVE 01/14/2019 0540   LEUKOCYTESUR TRACE (A) 01/14/2019 0540   LEUKOCYTESUR Negative 10/29/2013 1941   Sepsis Labs Invalid input(s): PROCALCITONIN,  WBC,  LACTICIDVEN Microbiology Recent Results (from the past 240 hour(s))  Culture, blood (Routine x 2)     Status: None (Preliminary result)   Collection Time: 01/13/19  8:30 PM   Specimen: BLOOD  Result Value Ref Range Status   Specimen Description BLOOD Blood Culture adequate volume  Final   Special Requests   Final    BOTTLES DRAWN AEROBIC AND ANAEROBIC LEFT ANTECUBITAL   Culture   Final    NO GROWTH 2 DAYS Performed at [facility], [address] [place], [place] [address]    Report Status PENDING  Incomplete  SARS CORONAVIRUS 2 (TAT 6-24 HRS) Nasopharyngeal Nasopharyngeal Swab     Status: Abnormal   Collection Time: 01/14/19  4:35 AM   Specimen: Nasopharyngeal Swab  Result Value Ref Range Status   SARS Coronavirus 2 POSITIVE (A) NEGATIVE Final    Comment: RESULT CALLED TO, READ BACK BY AND VERIFIED WITH: T.TECOLA RN 1933 01/14/2019 MCCORMICK K (NOTE) SARS-CoV-2 target nucleic acids are DETECTED. The SARS-CoV-2 RNA is generally detectable in upper and lower respiratory specimens during the acute phase of infection. Positive results are indicative of the presence of SARS-CoV-2 RNA. Clinical correlation with patient history and other diagnostic information is  necessary to determine patient infection status. Positive results do not rule out bacterial infection or co-infection with other viruses.  The expected result is Negative. Fact Sheet for  Patients: SugarRoll.be Fact Sheet for Healthcare Providers: https://www.woods-mathews.com/ This test is not yet approved or cleared by the [place] FDA and  has been authorized for detection and/or diagnosis of SARS-CoV-2 by FDA under an Emergency Use Authorization (EUA). This EUA will remain  in effect (meaning this test can be used) for  the duration of the COVID-19  declaration under Section 564(b)(1) of the Act, 21 U.S.C. section 360bbb-3(b)(1), unless the authorization is terminated or revoked sooner. Performed at [facility], [place] [address], [place], [place] [address]      Time coordinating discharge: Over 30 minutes  SIGNED:   Wyvonnia Dusky, MD  [facility] 01/15/2019, 2:21 PM Pager   If 7PM-7AM, please contact night-coverage [email or web address] Password [email or web address]

## 2019-01-15 NOTE — Progress Notes (Addendum)
Patient ambulated to the bathroom and in the room multiple times on room air. The whole session, her oxygen saturation stayed at 93%. No complaints of shortness of breath or pain. MD notified. Patient will be discharging today. Tele monitor off. Left in the room.   Update 1354: Patient discharged. IV taken out. Covid packet given along with discharge instructions. Patient verbalized understanding without any questions or concerns.

## 2019-01-17 ENCOUNTER — Other Ambulatory Visit: Payer: Self-pay | Admitting: *Deleted

## 2019-01-17 ENCOUNTER — Encounter: Payer: Self-pay | Admitting: *Deleted

## 2019-01-17 DIAGNOSIS — M069 Rheumatoid arthritis, unspecified: Secondary | ICD-10-CM | POA: Insufficient documentation

## 2019-01-17 DIAGNOSIS — J849 Interstitial pulmonary disease, unspecified: Secondary | ICD-10-CM | POA: Insufficient documentation

## 2019-01-17 NOTE — Patient Outreach (Signed)
[place] [facility]) Care Management  01/17/2019  ROCHELLA SCARFO [birth year] BA:[id number]   Transition of care call/case closure   Referral received:01/17/19 Initial outreach:01/17/19 Insurance: [employer]    Subjective: Initial successful telephone call to patient's preferred number in order to complete transition of care assessment; 2 HIPAA identifiers verified. Explained purpose of call and completed transition of care assessment.  Amber Aguilar states that she is feeling better on today. She discussed still having cough,nonproductive. She reports monitoring temperature one to 2 times daily, this morning reports 101.3 and after tylenol down to 99.1. She denies pain, shortness of breath . She reports receiving daily covid monitoring calls , and remains on quarantine until 12/17, states staying separated in home from her husband.  She reports appetite slightly improving she can tell a slight difference in taste of food improvement, denies nausea... She  denies bowel or bladder problems. Her spouse is  assisting with her  recovery.  She discussed having diabetes and recent A1c at PCP visit was 7.1 and MD states that she didn't have to check her blood sugar any longer. She noted increase in A1c since having steroids up to 8.1 in hospital.  She will discuss with MD at visit. She is agreeable to  a referral to one of the [place] chronic disease management programs.  She reports making contact regarding leave of absence on today , she is unsure if she has Hospital indemnity plan agreeable to following up.  She uses [facility].  She denies educational needs related to staying safe during the COVID 19 pandemic.    Objective:  Amber Aguilar was hospitalized at [facility] from 12/ 4- 01/15/19 for Covid 19  Comorbidities include: Rheumatoid Arthritis, Interstitial lung diease, Diabetes.  She was discharged to home on 01/15/19  without the need for home  health services or DME.   Assessment:  Patient voices good understanding of all discharge instructions.  See transition of care flowsheet for assessment details.   Plan:  Reviewed hospital discharge diagnosis of  Covid 19 virus infection   and discharge treatment plan using hospital discharge instructions, assessing medication adherence, reviewing problems requiring provider notification, and discussing the importance of follow up with , primary care provider and/or specialists as directed. Reviewed [place]'s announcements that all [employer] members will receive the Healthy Lifestyle Premium rate in 2021.  Explained non cost benefit of [place] chronic disease management program telephonic  benefit with [employer], patient agreeable to assistance with enrolling in [place] website placed referral for Diabetes management. . Provided patient with contact number to [employer] to follow up on her benefit.  No ongoing care management needs identified so will close case to [place] [facility] services and route successful outreach letter with [place] [facility] pamphlet and 24 Hour Nurse Line Magnet to [place] [facility] clinical pool to be mailed to patient's home address. Thanked patient for their services to [facility].  Joylene Draft, RN, [place] Management Coordinator  [phone]- Mobile [phone]- Toll Free Main Office

## 2019-01-18 ENCOUNTER — Emergency Department: Payer: 59

## 2019-01-18 ENCOUNTER — Emergency Department
Admission: EM | Admit: 2019-01-18 | Discharge: 2019-01-18 | Disposition: A | Discharge status: Home / Self Care | Payer: 59 | Attending: Emergency Medicine | Admitting: Emergency Medicine

## 2019-01-18 ENCOUNTER — Encounter: Payer: Self-pay | Admitting: Emergency Medicine

## 2019-01-18 ENCOUNTER — Other Ambulatory Visit: Payer: Self-pay

## 2019-01-18 DIAGNOSIS — U071 COVID-19: Secondary | ICD-10-CM | POA: Diagnosis not present

## 2019-01-18 DIAGNOSIS — R0602 Shortness of breath: Secondary | ICD-10-CM | POA: Diagnosis not present

## 2019-01-18 DIAGNOSIS — M069 Rheumatoid arthritis, unspecified: Secondary | ICD-10-CM | POA: Diagnosis not present

## 2019-01-18 DIAGNOSIS — E119 Type 2 diabetes mellitus without complications: Secondary | ICD-10-CM | POA: Diagnosis not present

## 2019-01-18 DIAGNOSIS — R0789 Other chest pain: Secondary | ICD-10-CM | POA: Insufficient documentation

## 2019-01-18 DIAGNOSIS — R079 Chest pain, unspecified: Secondary | ICD-10-CM | POA: Diagnosis not present

## 2019-01-18 DIAGNOSIS — J449 Chronic obstructive pulmonary disease, unspecified: Secondary | ICD-10-CM | POA: Insufficient documentation

## 2019-01-18 DIAGNOSIS — I509 Heart failure, unspecified: Secondary | ICD-10-CM | POA: Diagnosis not present

## 2019-01-18 LAB — CBC
HCT: 41.2 % (ref 36.0–46.0)
Hemoglobin: 13.5 g/dL (ref 12.0–15.0)
MCH: 25.8 pg — ABNORMAL LOW (ref 26.0–34.0)
MCHC: 32.8 g/dL (ref 30.0–36.0)
MCV: 78.8 fL — ABNORMAL LOW (ref 80.0–100.0)
Platelets: 533 10*3/uL — ABNORMAL HIGH (ref 150–400)
RBC: 5.23 MIL/uL — ABNORMAL HIGH (ref 3.87–5.11)
RDW: 13 % (ref 11.5–15.5)
WBC: 11.8 10*3/uL — ABNORMAL HIGH (ref 4.0–10.5)
nRBC: 0 % (ref 0.0–0.2)

## 2019-01-18 LAB — BASIC METABOLIC PANEL
Anion gap: 12 (ref 5–15)
BUN: 18 mg/dL (ref 8–23)
CO2: 27 mmol/L (ref 22–32)
Calcium: 9.2 mg/dL (ref 8.9–10.3)
Chloride: 97 mmol/L — ABNORMAL LOW (ref 98–111)
Creatinine, Ser: 1.07 mg/dL — ABNORMAL HIGH (ref 0.44–1.00)
GFR calc Af Amer: >60 mL/min (ref >60)
GFR calc non Af Amer: 56 mL/min — ABNORMAL LOW (ref >60)
Glucose, Bld: 195 mg/dL — ABNORMAL HIGH (ref 70–99)
Potassium: 3.1 mmol/L — ABNORMAL LOW (ref 3.5–5.1)
Sodium: 136 mmol/L (ref 135–145)

## 2019-01-18 LAB — TROPONIN I (HIGH SENSITIVITY)
Troponin I (High Sensitivity): 5 ng/L (ref <18)
Troponin I (High Sensitivity): 4 ng/L (ref <18)

## 2019-01-18 LAB — CULTURE, BLOOD (ROUTINE X 2)
Culture: NO GROWTH
Specimen Description: ADEQUATE

## 2019-01-18 MED ORDER — HYDROCOD POLST-CPM POLST ER 10-8 MG/5ML PO SUER: 5.0000 mL | Freq: Two times a day (BID) | ORAL | 0 refills | Status: DC

## 2019-01-18 MED ORDER — SODIUM CHLORIDE 0.9 % IV BOLUS
500.0000 mL | Freq: Once | INTRAVENOUS | Status: AC
  Administered 2019-01-18: 500 mL via INTRAVENOUS

## 2019-01-18 MED ORDER — ONDANSETRON HCL 4 MG/2ML IJ SOLN
4.0000 mg | Freq: Once | INTRAMUSCULAR | Status: AC
  Administered 2019-01-18: 4 mg via INTRAVENOUS
  Filled 2019-01-18: qty 2

## 2019-01-18 MED ORDER — ACETAMINOPHEN 500 MG PO TABS: 1000.0000 mg | ORAL_TABLET | Freq: Once | ORAL | Status: DC

## 2019-01-18 MED ORDER — ACETAMINOPHEN 325 MG PO TABS
650.0000 mg | ORAL_TABLET | Freq: Once | ORAL | Status: AC
  Administered 2019-01-18: 650 mg via ORAL
  Filled 2019-01-18: qty 2

## 2019-01-18 MED ORDER — ONDANSETRON 4 MG PO TBDP: 4.0000 mg | ORAL_TABLET | Freq: Three times a day (TID) | ORAL | 0 refills | Status: DC | PRN

## 2019-01-18 MED ORDER — KETOROLAC TROMETHAMINE 30 MG/ML IJ SOLN
30.0000 mg | Freq: Once | INTRAMUSCULAR | Status: AC
  Administered 2019-01-18: 30 mg via INTRAVENOUS
  Filled 2019-01-18: qty 1

## 2019-01-18 NOTE — ED Provider Notes (Signed)
[facility] Emergency Department Provider Note       Time seen: ----------------------------------------- 6:23 PM on 01/18/2019 -----------------------------------------   I have reviewed the triage vital signs and the nursing notes.  HISTORY   Chief Complaint Chest Pain and Shortness of Breath    HPI Amber Aguilar is a 62 y.o. female with a history of COVID-19 infection on November 24, CHF, COPD, diabetes, arthritis who presents to the ED for chest pain with increased shortness of breath, poor p.o. intake and stomach upset.  Discomfort is 8 out of 10 in her chest.  She is currently taking dexamethasone.  Past Medical History:  Diagnosis Date  . Arthritis    rheumatoid  . CHF (congestive heart failure) ([place])   . COPD (chronic obstructive pulmonary disease) ([place])   . Diabetes mellitus without complication [facility])     Patient Active Problem List   Diagnosis Date Noted  . Rheumatoid arthritis ([place]) 01/17/2019  . Interstitial lung disease ([place]) 01/17/2019  . Diabetes mellitus ([place])   . Acute respiratory failure due to COVID-19 [facility]) 01/14/2019    Past Surgical History:  Procedure Laterality Date  . CARPAL TUNNEL RELEASE      Allergies Benzalkonium chloride and Neosporin [neomycin-bacitracin zn-polymyx]  Social History Social History   Tobacco Use  . Smoking status: Never Smoker  . Smokeless tobacco: Never Used  Substance Use Topics  . Alcohol use: No  . Drug use: No   Review of Systems Constitutional: Negative for fever. Cardiovascular: Positive for chest pain Respiratory: Positive for shortness of breath Gastrointestinal: Negative for abdominal pain, vomiting and diarrhea. Musculoskeletal: Negative for back pain. Skin: Negative for rash. Neurological: Negative for headaches, positive for weakness  All systems negative/normal/unremarkable except as stated in the HPI  ____________________________________________   PHYSICAL  EXAM:  VITAL SIGNS: ED Triage Vitals  Enc Vitals Group     BP 01/18/19 1757 99/65     Pulse Rate 01/18/19 1757 100     Resp 01/18/19 1757 20     Temp 01/18/19 1757 (!) 101 F (38.3 C)     Temp Source 01/18/19 1757 Oral     SpO2 01/18/19 1757 93 %     Weight 01/18/19 1505 199 lb (90.3 kg)     Height 01/18/19 1505 5\' 7"  (1.702 m)     Head Circumference --      Peak Flow --      Pain Score 01/18/19 1505 8     Pain Loc --      Pain Edu? --      Excl. in [place]? --    Constitutional: Alert and oriented. Well appearing and in no distress. Eyes: Conjunctivae are normal. Normal extraocular movements. ENT      Head: Normocephalic and atraumatic.      Nose: No congestion/rhinnorhea.      Mouth/Throat: Mucous membranes are moist.      Neck: No stridor. Cardiovascular: Normal rate, regular rhythm. No murmurs, rubs, or gallops. Respiratory: Normal respiratory effort without tachypnea nor retractions. Breath sounds are clear and equal bilaterally. No wheezes/rales/rhonchi. Gastrointestinal: Soft and nontender. Normal bowel sounds Musculoskeletal: Nontender with normal range of motion in extremities. No lower extremity tenderness nor edema. Neurologic:  Normal speech and language. No gross focal neurologic deficits are appreciated.  Skin:  Skin is warm, dry and intact. No rash noted. Psychiatric: Mood and affect are normal. Speech and behavior are normal.  ____________________________________________  EKG: Interpreted by me.  Sinus rhythm with rate of  99 bpm, left atrial enlargement, nonspecific ST segment changes, normal QT  ____________________________________________  ED COURSE:  As part of my medical decision making, I reviewed the following data within the [place] History obtained from family if available, nursing notes, old chart and ekg, as well as notes from prior ED visits. Patient presented for chest pain shortness of breath, we will assess with labs and imaging  as indicated at this time.   Procedures  Amber Aguilar was evaluated in Emergency Department on 01/18/2019 for the symptoms described in the history of present illness. She was evaluated in the context of the global COVID-19 pandemic, which necessitated consideration that the patient might be at risk for infection with the SARS-CoV-2 virus that causes COVID-19. Institutional protocols and algorithms that pertain to the evaluation of patients at risk for COVID-19 are in a state of rapid change based on information released by regulatory bodies including the CDC and federal and state organizations. These policies and algorithms were followed during the patient's care in the ED.  ____________________________________________   LABS (pertinent positives/negatives)  Labs Reviewed  BASIC METABOLIC PANEL - Abnormal; Notable for the following components:      Result Value   Potassium 3.1 (*)    Chloride 97 (*)    Glucose, Bld 195 (*)    Creatinine, Ser 1.07 (*)    GFR calc non Af Amer 56 (*)    All other components within normal limits  CBC - Abnormal; Notable for the following components:   WBC 11.8 (*)    RBC 5.23 (*)    MCV 78.8 (*)    MCH 25.8 (*)    Platelets 533 (*)    All other components within normal limits  TROPONIN I (HIGH SENSITIVITY)  TROPONIN I (HIGH SENSITIVITY)    RADIOLOGY Images were viewed by me Chest x-ray IMPRESSION: Persistent bibasilar infiltrates. Recommend follow-up to resolution. Persistent bibasilar infiltrates, likely due to the patient's COVID-19 status. No other acute abnormalities are seen. ____________________________________________   DIFFERENTIAL DIAGNOSIS   COVID-19, pneumonia, PE, pneumothorax, MI, unstable angina, musculoskeletal pain  FINAL ASSESSMENT AND PLAN  COVID-19, chest pain, dyspnea   Plan: The patient had presented for persistent symptoms of COVID-19. Patient's labs initially were unremarkable.  Repeat troponin was negative.   Patient's imaging revealed persistent bibasilar infiltrates.  Ambulatory sats were above 90%.  She is cleared for outpatient follow-up.   Laurence Aly, MD    Note: This note was generated in part or whole with voice recognition software. Voice recognition is usually quite accurate but there are transcription errors that can and very often do occur. I apologize for any typographical errors that were not detected and corrected.     Earleen Newport, MD 01/18/19 2055

## 2019-01-18 NOTE — ED Notes (Signed)
Patient ambulated in room with pulse ox attached. Patient started at 94% and desat to 90% while ambulating. No dyspnea noted.

## 2019-01-18 NOTE — ED Notes (Signed)
Patient discharged to home per MD order. Patient in stable condition, and deemed medically cleared by ED provider for discharge. Discharge instructions reviewed with patient/family using "Teach Back"; verbalized understanding of medication education and administration, and information about follow-up care. Denies further concerns. ° °

## 2019-01-18 NOTE — ED Triage Notes (Signed)
Pt reports is COVID positive and was admitted and d/c on Saturday and now having CP and increased SOB.

## 2019-01-20 DIAGNOSIS — J8489 Other specified interstitial pulmonary diseases: Secondary | ICD-10-CM | POA: Diagnosis not present

## 2019-01-20 DIAGNOSIS — Z6836 Body mass index (BMI) 36.0-36.9, adult: Secondary | ICD-10-CM | POA: Diagnosis not present

## 2019-01-22 ENCOUNTER — Other Ambulatory Visit: Payer: Self-pay

## 2019-01-22 ENCOUNTER — Inpatient Hospital Stay
Admission: EM | Admit: 2019-01-22 | Discharge: 2019-01-23 | DRG: 871 | Disposition: A | Discharge status: Other Acute Inpatient Hospital | Payer: 59 | Attending: Internal Medicine | Admitting: Internal Medicine

## 2019-01-22 ENCOUNTER — Emergency Department: Payer: 59

## 2019-01-22 ENCOUNTER — Inpatient Hospital Stay: Admission: AD | Admit: 2019-01-22 | Payer: 59 | Source: Other Acute Inpatient Hospital | Admitting: Internal Medicine

## 2019-01-22 DIAGNOSIS — R7989 Other specified abnormal findings of blood chemistry: Secondary | ICD-10-CM | POA: Diagnosis not present

## 2019-01-22 DIAGNOSIS — I5032 Chronic diastolic (congestive) heart failure: Secondary | ICD-10-CM | POA: Diagnosis present

## 2019-01-22 DIAGNOSIS — R0602 Shortness of breath: Secondary | ICD-10-CM | POA: Diagnosis not present

## 2019-01-22 DIAGNOSIS — U071 COVID-19: Secondary | ICD-10-CM | POA: Diagnosis present

## 2019-01-22 DIAGNOSIS — I248 Other forms of acute ischemic heart disease: Secondary | ICD-10-CM | POA: Diagnosis present

## 2019-01-22 DIAGNOSIS — R000 Tachycardia, unspecified: Secondary | ICD-10-CM | POA: Diagnosis not present

## 2019-01-22 DIAGNOSIS — Z7984 Long term (current) use of oral hypoglycemic drugs: Secondary | ICD-10-CM | POA: Diagnosis not present

## 2019-01-22 DIAGNOSIS — J440 Chronic obstructive pulmonary disease with acute lower respiratory infection: Secondary | ICD-10-CM | POA: Diagnosis present

## 2019-01-22 DIAGNOSIS — E119 Type 2 diabetes mellitus without complications: Secondary | ICD-10-CM | POA: Diagnosis present

## 2019-01-22 DIAGNOSIS — J1289 Other viral pneumonia: Secondary | ICD-10-CM | POA: Diagnosis present

## 2019-01-22 DIAGNOSIS — A4189 Other specified sepsis: Secondary | ICD-10-CM | POA: Diagnosis present

## 2019-01-22 DIAGNOSIS — J9601 Acute respiratory failure with hypoxia: Secondary | ICD-10-CM | POA: Diagnosis not present

## 2019-01-22 DIAGNOSIS — Z79899 Other long term (current) drug therapy: Secondary | ICD-10-CM

## 2019-01-22 DIAGNOSIS — A419 Sepsis, unspecified organism: Secondary | ICD-10-CM

## 2019-01-22 DIAGNOSIS — R7982 Elevated C-reactive protein (CRP): Secondary | ICD-10-CM | POA: Diagnosis present

## 2019-01-22 DIAGNOSIS — J1282 Pneumonia due to coronavirus disease 2019: Secondary | ICD-10-CM | POA: Diagnosis present

## 2019-01-22 DIAGNOSIS — R918 Other nonspecific abnormal finding of lung field: Secondary | ICD-10-CM | POA: Diagnosis not present

## 2019-01-22 DIAGNOSIS — E876 Hypokalemia: Secondary | ICD-10-CM | POA: Diagnosis present

## 2019-01-22 DIAGNOSIS — M069 Rheumatoid arthritis, unspecified: Secondary | ICD-10-CM | POA: Diagnosis present

## 2019-01-22 DIAGNOSIS — R6520 Severe sepsis without septic shock: Secondary | ICD-10-CM | POA: Diagnosis not present

## 2019-01-22 DIAGNOSIS — R0902 Hypoxemia: Secondary | ICD-10-CM | POA: Diagnosis not present

## 2019-01-22 DIAGNOSIS — R0689 Other abnormalities of breathing: Secondary | ICD-10-CM | POA: Diagnosis not present

## 2019-01-22 LAB — COMPREHENSIVE METABOLIC PANEL
ALT: 32 U/L (ref 0–44)
AST: 49 U/L — ABNORMAL HIGH (ref 15–41)
Albumin: 3.3 g/dL — ABNORMAL LOW (ref 3.5–5.0)
Alkaline Phosphatase: 52 U/L (ref 38–126)
Anion gap: 14 (ref 5–15)
BUN: 19 mg/dL (ref 8–23)
CO2: 25 mmol/L (ref 22–32)
Calcium: 8.4 mg/dL — ABNORMAL LOW (ref 8.9–10.3)
Chloride: 95 mmol/L — ABNORMAL LOW (ref 98–111)
Creatinine, Ser: 1.09 mg/dL — ABNORMAL HIGH (ref 0.44–1.00)
GFR calc Af Amer: >60 mL/min (ref >60)
GFR calc non Af Amer: 55 mL/min — ABNORMAL LOW (ref >60)
Glucose, Bld: 143 mg/dL — ABNORMAL HIGH (ref 70–99)
Potassium: 2.9 mmol/L — ABNORMAL LOW (ref 3.5–5.1)
Sodium: 134 mmol/L — ABNORMAL LOW (ref 135–145)
Total Bilirubin: 0.7 mg/dL (ref 0.3–1.2)
Total Protein: 7 g/dL (ref 6.5–8.1)

## 2019-01-22 LAB — CBC WITH DIFFERENTIAL/PLATELET
Abs Immature Granulocytes: 0.13 10*3/uL — ABNORMAL HIGH (ref 0.00–0.07)
Basophils Absolute: 0 10*3/uL (ref 0.0–0.1)
Basophils Relative: 0 %
Eosinophils Absolute: 0 10*3/uL (ref 0.0–0.5)
Eosinophils Relative: 0 %
HCT: 36.4 % (ref 36.0–46.0)
Hemoglobin: 12.2 g/dL (ref 12.0–15.0)
Immature Granulocytes: 1 %
Lymphocytes Relative: 12 %
Lymphs Abs: 2 10*3/uL (ref 0.7–4.0)
MCH: 26.1 pg (ref 26.0–34.0)
MCHC: 33.5 g/dL (ref 30.0–36.0)
MCV: 77.8 fL — ABNORMAL LOW (ref 80.0–100.0)
Monocytes Absolute: 1 10*3/uL (ref 0.1–1.0)
Monocytes Relative: 6 %
Neutro Abs: 13.4 10*3/uL — ABNORMAL HIGH (ref 1.7–7.7)
Neutrophils Relative %: 81 %
Platelets: 448 10*3/uL — ABNORMAL HIGH (ref 150–400)
RBC: 4.68 MIL/uL (ref 3.87–5.11)
RDW: 12.9 % (ref 11.5–15.5)
WBC: 16.6 10*3/uL — ABNORMAL HIGH (ref 4.0–10.5)
nRBC: 0.1 % (ref 0.0–0.2)

## 2019-01-22 LAB — URINALYSIS, ROUTINE W REFLEX MICROSCOPIC
Bacteria, UA: NONE SEEN
Bilirubin Urine: NEGATIVE
Glucose, UA: NEGATIVE mg/dL
Hgb urine dipstick: NEGATIVE
Ketones, ur: 5 mg/dL — AB
Leukocytes,Ua: NEGATIVE
Nitrite: NEGATIVE
Protein, ur: 100 mg/dL — AB
Specific Gravity, Urine: 1.015 (ref 1.005–1.030)
pH: 6 (ref 5.0–8.0)

## 2019-01-22 LAB — BLOOD GAS, VENOUS
Acid-Base Excess: 7.4 mmol/L — ABNORMAL HIGH (ref 0.0–2.0)
Bicarbonate: 29.3 mmol/L — ABNORMAL HIGH (ref 20.0–28.0)
O2 Saturation: 82.6 %
Patient temperature: 37
pCO2, Ven: 32 mmHg — ABNORMAL LOW (ref 44.0–60.0)
pH, Ven: 7.57 — ABNORMAL HIGH (ref 7.250–7.430)
pO2, Ven: 39 mmHg (ref 32.0–45.0)

## 2019-01-22 LAB — APTT: aPTT: 30 seconds (ref 24–36)

## 2019-01-22 LAB — PROCALCITONIN: Procalcitonin: 32.96 ng/mL

## 2019-01-22 LAB — BRAIN NATRIURETIC PEPTIDE: B Natriuretic Peptide: 51 pg/mL (ref 0.0–100.0)

## 2019-01-22 LAB — C-REACTIVE PROTEIN: CRP: 12.8 mg/dL — ABNORMAL HIGH (ref <1.0)

## 2019-01-22 LAB — FIBRINOGEN: Fibrinogen: 646 mg/dL — ABNORMAL HIGH (ref 210–475)

## 2019-01-22 LAB — TROPONIN I (HIGH SENSITIVITY)
Troponin I (High Sensitivity): 255 ng/L (ref <18)
Troponin I (High Sensitivity): 365 ng/L (ref <18)

## 2019-01-22 LAB — LACTIC ACID, PLASMA
Lactic Acid, Venous: 1.8 mmol/L (ref 0.5–1.9)
Lactic Acid, Venous: 1.4 mmol/L (ref 0.5–1.9)

## 2019-01-22 LAB — HEPARIN LEVEL (UNFRACTIONATED): Heparin Unfractionated: 0.39 IU/mL (ref 0.30–0.70)

## 2019-01-22 LAB — FERRITIN: Ferritin: 796 ng/mL — ABNORMAL HIGH (ref 11–307)

## 2019-01-22 LAB — MAGNESIUM: Magnesium: 2.3 mg/dL (ref 1.7–2.4)

## 2019-01-22 LAB — FIBRIN DERIVATIVES D-DIMER (ARMC ONLY): Fibrin derivatives D-dimer (ARMC): 5150.95 ng/mL (FEU) — ABNORMAL HIGH (ref 0.00–499.00)

## 2019-01-22 LAB — TRIGLYCERIDES: Triglycerides: 114 mg/dL (ref <150)

## 2019-01-22 LAB — PROTIME-INR
INR: 1.1 (ref 0.8–1.2)
Prothrombin Time: 14.4 seconds (ref 11.4–15.2)

## 2019-01-22 LAB — LACTATE DEHYDROGENASE: LDH: 681 U/L — ABNORMAL HIGH (ref 98–192)

## 2019-01-22 MED ORDER — SODIUM CHLORIDE 0.9 % IV SOLN
2.0000 g | Freq: Once | INTRAVENOUS | Status: AC
  Administered 2019-01-22: 2 g via INTRAVENOUS
  Filled 2019-01-22: qty 2

## 2019-01-22 MED ORDER — VANCOMYCIN HCL IN DEXTROSE 1-5 GM/200ML-% IV SOLN
1000.0000 mg | Freq: Once | INTRAVENOUS | Status: AC
  Administered 2019-01-22: 1000 mg via INTRAVENOUS
  Filled 2019-01-22: qty 200

## 2019-01-22 MED ORDER — POTASSIUM CHLORIDE 10 MEQ/100ML IV SOLN
10.0000 meq | INTRAVENOUS | Status: AC
  Filled 2019-01-22 (×2): qty 100

## 2019-01-22 MED ORDER — SODIUM CHLORIDE 0.9 % IV BOLUS
500.0000 mL | Freq: Once | INTRAVENOUS | Status: AC
  Administered 2019-01-22: 500 mL via INTRAVENOUS

## 2019-01-22 MED ORDER — SODIUM CHLORIDE 0.9 % IV SOLN
2.0000 g | Freq: Two times a day (BID) | INTRAVENOUS | Status: DC
  Filled 2019-01-22 (×2): qty 2

## 2019-01-22 MED ORDER — FLUTICASONE PROPIONATE 50 MCG/ACT NA SUSP
1.0000 | Freq: Every day | NASAL | Status: DC
  Administered 2019-01-23: 1 via NASAL
  Filled 2019-01-22: qty 16

## 2019-01-22 MED ORDER — SODIUM CHLORIDE 0.9 % IV SOLN
200.0000 mg | Freq: Once | INTRAVENOUS | Status: AC
  Administered 2019-01-22: 200 mg via INTRAVENOUS
  Filled 2019-01-22: qty 200

## 2019-01-22 MED ORDER — ACETAMINOPHEN 325 MG PO TABS: 650.0000 mg | ORAL_TABLET | Freq: Four times a day (QID) | ORAL | Status: DC | PRN

## 2019-01-22 MED ORDER — PANTOPRAZOLE SODIUM 40 MG PO TBEC
40.0000 mg | DELAYED_RELEASE_TABLET | Freq: Every day | ORAL | Status: DC
  Administered 2019-01-23: 40 mg via ORAL
  Filled 2019-01-22: qty 1

## 2019-01-22 MED ORDER — POTASSIUM CHLORIDE 10 MEQ/100ML IV SOLN
10.0000 meq | INTRAVENOUS | Status: AC
  Administered 2019-01-22 – 2019-01-23 (×2): 10 meq via INTRAVENOUS
  Filled 2019-01-22 (×2): qty 100

## 2019-01-22 MED ORDER — HYDROXYZINE HCL 25 MG PO TABS
25.0000 mg | ORAL_TABLET | Freq: Four times a day (QID) | ORAL | Status: DC | PRN
  Filled 2019-01-22: qty 1

## 2019-01-22 MED ORDER — SODIUM CHLORIDE 0.9 % IV SOLN
500.0000 mg | INTRAVENOUS | Status: DC
  Administered 2019-01-23: 500 mg via INTRAVENOUS
  Filled 2019-01-22: qty 500

## 2019-01-22 MED ORDER — MYCOPHENOLATE MOFETIL 250 MG PO CAPS
500.0000 mg | ORAL_CAPSULE | Freq: Two times a day (BID) | ORAL | Status: DC
  Administered 2019-01-23 (×2): 500 mg via ORAL
  Filled 2019-01-22 (×4): qty 2

## 2019-01-22 MED ORDER — METHYLPREDNISOLONE SODIUM SUCC 40 MG IJ SOLR
40.0000 mg | Freq: Two times a day (BID) | INTRAMUSCULAR | Status: DC
  Administered 2019-01-23 (×2): 40 mg via INTRAVENOUS
  Filled 2019-01-22 (×2): qty 1

## 2019-01-22 MED ORDER — HEPARIN (PORCINE) 25000 UT/250ML-% IV SOLN
1000.0000 [IU]/h | INTRAVENOUS | Status: DC
  Administered 2019-01-22: 1000 [IU]/h via INTRAVENOUS
  Filled 2019-01-22 (×3): qty 250

## 2019-01-22 MED ORDER — ATORVASTATIN CALCIUM 10 MG PO TABS
10.0000 mg | ORAL_TABLET | Freq: Every day | ORAL | Status: DC
  Administered 2019-01-23: 10 mg via ORAL
  Filled 2019-01-22: qty 1

## 2019-01-22 MED ORDER — DEXAMETHASONE SODIUM PHOSPHATE 10 MG/ML IJ SOLN
6.0000 mg | Freq: Once | INTRAMUSCULAR | Status: AC
  Administered 2019-01-22: 6 mg via INTRAVENOUS
  Filled 2019-01-22: qty 1

## 2019-01-22 MED ORDER — VITAMIN B-12 1000 MCG PO TABS
1000.0000 ug | ORAL_TABLET | Freq: Every day | ORAL | Status: DC
  Administered 2019-01-23: 1000 ug via ORAL
  Filled 2019-01-22: qty 1

## 2019-01-22 MED ORDER — VANCOMYCIN HCL 10 G IV SOLR
1750.0000 mg | INTRAVENOUS | Status: DC
  Filled 2019-01-22: qty 1750

## 2019-01-22 MED ORDER — SODIUM CHLORIDE 0.9 % IV SOLN
100.0000 mg | Freq: Every day | INTRAVENOUS | Status: DC
  Administered 2019-01-23: 100 mg via INTRAVENOUS
  Filled 2019-01-22: qty 100
  Filled 2019-01-22: qty 20

## 2019-01-22 MED ORDER — SODIUM CHLORIDE 0.9 % IV SOLN
500.0000 mg | Freq: Once | INTRAVENOUS | Status: AC
  Administered 2019-01-22: 500 mg via INTRAVENOUS
  Filled 2019-01-22: qty 500

## 2019-01-22 MED ORDER — ONDANSETRON 4 MG PO TBDP
4.0000 mg | ORAL_TABLET | Freq: Three times a day (TID) | ORAL | Status: DC | PRN
  Filled 2019-01-22: qty 1

## 2019-01-22 MED ORDER — ACETAMINOPHEN 500 MG PO TABS
1000.0000 mg | ORAL_TABLET | Freq: Once | ORAL | Status: AC
  Administered 2019-01-22: 1000 mg via ORAL
  Filled 2019-01-22: qty 2

## 2019-01-22 MED ORDER — ACETAMINOPHEN 650 MG RE SUPP: 650.0000 mg | Freq: Four times a day (QID) | RECTAL | Status: DC | PRN

## 2019-01-22 MED ORDER — HEPARIN BOLUS VIA INFUSION
4000.0000 [IU] | Freq: Once | INTRAVENOUS | Status: AC
  Administered 2019-01-22: 4000 [IU] via INTRAVENOUS
  Filled 2019-01-22: qty 4000

## 2019-01-22 NOTE — ED Notes (Signed)
RT at bedside.

## 2019-01-22 NOTE — ED Notes (Signed)
Attempted to call report to CCU- Charge RN states they are still re-assigning a room/nurse and will call back when they know where the pt is going.

## 2019-01-22 NOTE — ED Notes (Signed)
Pt assisted to bedside commode

## 2019-01-22 NOTE — ED Notes (Signed)
Attempted IV X2 without success. Alternate RN requested for attempt.

## 2019-01-22 NOTE — Consult Note (Signed)
CODE SEPSIS - PHARMACY COMMUNICATION  **Broad Spectrum Antibiotics should be administered within 1 hour of Sepsis diagnosis**  Time Code Sepsis Called/Page Received: 1551  Antibiotics Ordered: Cefepime/Vancomycin/Azithromycin  Time of 1st antibiotic administration: 1610  Additional action taken by pharmacy: None   If necessary, Name of Provider/Nurse Contacted: None   Lu Duffel, PharmD, BCPS Clinical Pharmacist 01/22/2019 4:21 PM

## 2019-01-22 NOTE — ED Provider Notes (Signed)
[facility] Emergency Department Provider Note  ____________________________________________   First MD Initiated Contact with Patient 01/22/19 1501     (approximate)  I have reviewed the triage vital signs and the nursing notes.   HISTORY  Chief Complaint Shortness of Breath    HPI Amber Aguilar is a 63 y.o. female with CHF, COPD, diabetes, coronavirus diagnosed on 12/4 who comes in with shortness of breath.  Patient has had worsening shortness of breath over the past few days, severe, constant, nothing makes better, worse with exertion.  Patient was found to be satting in the 85s.  Placed on nonrebreather by EMS.  Satting in the upper 80s on the right.  She denies any abdominal pain or other concerns.            Past Medical History:  Diagnosis Date  . Arthritis    rheumatoid  . CHF (congestive heart failure) ([place])   . COPD (chronic obstructive pulmonary disease) ([place])   . Diabetes mellitus without complication [facility])     Patient Active Problem List   Diagnosis Date Noted  . Rheumatoid arthritis ([place]) 01/17/2019  . Interstitial lung disease ([place]) 01/17/2019  . Diabetes mellitus ([place])   . Acute respiratory failure due to COVID-19 [facility]) 01/14/2019    Past Surgical History:  Procedure Laterality Date  . CARPAL TUNNEL RELEASE      Prior to Admission medications   Medication Sig Start Date End Date Taking? Authorizing Provider  acetaminophen (TYLENOL) 500 MG tablet Take 1 tablet (500 mg total) by mouth every 6 (six) hours as needed. 04/24/18   Laban Emperor, PA-C  albuterol (PROVENTIL) (2.5 MG/3ML) 0.083% nebulizer solution Take 2.5 mg by nebulization every 6 (six) hours as needed for wheezing or shortness of breath.    [provider]  atorvastatin (LIPITOR) 10 MG tablet Take 10 mg by mouth daily.    [provider]  chlorpheniramine-HYDROcodone (TUSSIONEX PENNKINETIC ER) 10-8 MG/5ML SUER Take 5 mLs by mouth 2 (two) times  daily. 01/18/19   Earleen Newport, MD  fluticasone (FLONASE) 50 MCG/ACT nasal spray Place 1 spray into both nostrils daily.     [provider]  hydrochlorothiazide (HYDRODIURIL) 25 MG tablet Take 25 mg by mouth daily.    [provider]  hydrOXYzine (ATARAX/VISTARIL) 25 MG tablet Take 25 mg by mouth every 6 (six) hours as needed.  11/16/18   [provider]  metFORMIN (GLUCOPHAGE) 1000 MG tablet Take 1,000 mg by mouth daily with breakfast.    [provider]  Multiple Vitamin (MULTI-VITAMIN) tablet Take 1 tablet by mouth daily.     [provider]  mycophenolate (CELLCEPT) 500 MG tablet Take by mouth 2 (two) times daily.    [provider]  omeprazole (PRILOSEC) 20 MG capsule Take 20 mg by mouth daily.    [provider]  ondansetron (ZOFRAN ODT) 4 MG disintegrating tablet Take 1 tablet (4 mg total) by mouth every 8 (eight) hours as needed for nausea or vomiting. 01/18/19   Earleen Newport, MD  vitamin B-12 (CYANOCOBALAMIN) 1000 MCG tablet Take 1,000 mcg by mouth daily.     [provider]    Allergies Benzalkonium chloride and Neosporin [neomycin-bacitracin zn-polymyx]  History reviewed. No pertinent family history.  Social History Social History   Tobacco Use  . Smoking status: Never Smoker  . Smokeless tobacco: Never Used  Substance Use Topics  . Alcohol use: No  . Drug use: No  Review of Systems Constitutional: No fever/chills Eyes: No visual changes. ENT: No sore throat. Cardiovascular: No chest pain Respiratory: Positive for SOB Gastrointestinal: No abdominal pain.  No nausea, no vomiting.  No diarrhea.  No constipation. Genitourinary: Negative for dysuria. Musculoskeletal: Negative for back pain. Skin: Negative for rash. Neurological: Negative for headaches, focal weakness or numbness. All other ROS negative ____________________________________________   PHYSICAL EXAM:  VITAL  SIGNS: ED Triage Vitals  Enc Vitals Group     BP 01/22/19 1445 (!) 102/35     Pulse Rate 01/22/19 1445 (!) 124     Resp 01/22/19 1445 (!) 32     Temp --      Temp src --      SpO2 01/22/19 1445 93 %     Weight 01/22/19 1447 199 lb (90.3 kg)     Height 01/22/19 1447 5\' 2"  (1.575 m)     Head Circumference --      Peak Flow --      Pain Score 01/22/19 1447 1     Pain Loc --      Pain Edu? --      Excl. in [place]? --     Constitutional: Alert and oriented. Well appearing and in no acute distress. Eyes: Conjunctivae are normal. EOMI. Head: Atraumatic. Nose: No congestion/rhinnorhea. Mouth/Throat: Mucous membranes are moist.   Neck: No stridor. Trachea Midline. FROM Cardiovascular: Normal rate, regular rhythm. Grossly normal heart sounds.  Good peripheral circulation. Respiratory: Clear breath sounds bilaterally, mild increased work of breathing, on nonrebreather satting 88% Gastrointestinal: Soft and nontender. No distention. No abdominal bruits.  Musculoskeletal: No lower extremity tenderness nor edema.  No joint effusions. Neurologic:  Normal speech and language. No gross focal neurologic deficits are appreciated.  Skin:  Skin is warm, dry and intact. No rash noted. Psychiatric: Mood and affect are normal. Speech and behavior are normal. GU: Deferred   ____________________________________________   LABS (all labs ordered are listed, but only abnormal results are displayed)  Labs Reviewed  CBC WITH DIFFERENTIAL/PLATELET - Abnormal; Notable for the following components:      Result Value   WBC 16.6 (*)    MCV 77.8 (*)    Platelets 448 (*)    Neutro Abs 13.4 (*)    Abs Immature Granulocytes 0.13 (*)    All other components within normal limits  COMPREHENSIVE METABOLIC PANEL - Abnormal; Notable for the following components:   Sodium 134 (*)    Potassium 2.9 (*)    Chloride 95 (*)    Glucose, Bld 143 (*)    Creatinine, Ser 1.09 (*)    Calcium 8.4 (*)    Albumin 3.3 (*)     AST 49 (*)    GFR calc non Af Amer 55 (*)    All other components within normal limits  FIBRIN DERIVATIVES D-DIMER ([facility] ONLY) - Abnormal; Notable for the following components:   Fibrin derivatives D-dimer ([facility]) 5,150.95 (*)    All other components within normal limits  LACTATE DEHYDROGENASE - Abnormal; Notable for the following components:   LDH 681 (*)    All other components within normal limits  FERRITIN - Abnormal; Notable for the following components:   Ferritin 796 (*)    All other components within normal limits  FIBRINOGEN - Abnormal; Notable for the following components:   Fibrinogen 646 (*)    All other components within normal limits  BLOOD GAS, VENOUS - Abnormal; Notable for the following components:   pH, Ven 7.57 (*)  pCO2, Ven 32 (*)    Bicarbonate 29.3 (*)    Acid-Base Excess 7.4 (*)    All other components within normal limits  TROPONIN I (HIGH SENSITIVITY) - Abnormal; Notable for the following components:   Troponin I (High Sensitivity) 255 (*)    All other components within normal limits  CULTURE, BLOOD (ROUTINE X 2)  CULTURE, BLOOD (ROUTINE X 2)  LACTIC ACID, PLASMA  PROCALCITONIN  TRIGLYCERIDES  BRAIN NATRIURETIC PEPTIDE  LACTIC ACID, PLASMA  C-REACTIVE PROTEIN  URINALYSIS, ROUTINE W REFLEX MICROSCOPIC  TROPONIN I (HIGH SENSITIVITY)   ____________________________________________   ED ECG REPORT I, Vanessa Mahomet, the attending physician, personally viewed and interpreted this ECG.  EKG is sinus tachycardia rate of 123, no ST elevation, T wave inversion in lead III, normal intervals ____________________________________________  RADIOLOGY Robert Bellow, personally viewed and evaluated these images (plain radiographs) as part of my medical decision making, as well as reviewing the written report by the radiologist.  ED MD interpretation: Bilateral opacities consistent with her COVID-19.  Official radiology report(s): DG Chest Port 1  View  Result Date: 01/22/2019 CLINICAL DATA:  Increasing SOB that worsened today. Per ER notes: Per EMS pt had O2 sats of 70 when they arrived at her home and she had SOB and recent positive covid test.Hx of CHF, COPD, DM EXAM: PORTABLE CHEST 1 VIEW COMPARISON:  01/18/2019 and earlier exams. FINDINGS: Bilateral airspace lung opacities, which predominate in the lower lungs, have increased from prior exam. No convincing pleural effusion.  No pneumothorax. Cardiac silhouette is normal in size. IMPRESSION: 1. Interval worsening of lung aeration with increased bilateral airspace lung opacities. Findings consistent with worsened COVID-19 pneumonia. Electronically Signed   By: Lajean Manes M.D.   On: 01/22/2019 15:31    ____________________________________________   PROCEDURES  Procedure(s) performed (including Critical Care):  .Critical Care Performed by: Vanessa [place], MD Authorized by: Vanessa [place], MD   Critical care provider statement:    Critical care time (minutes):  45   Critical care was necessary to treat or prevent imminent or life-threatening deterioration of the following conditions:  Sepsis and respiratory failure   Critical care was time spent personally by me on the following activities:  Discussions with consultants, evaluation of patient's response to treatment, examination of patient, ordering and performing treatments and interventions, ordering and review of laboratory studies, ordering and review of radiographic studies, pulse oximetry, re-evaluation of patient's condition, obtaining history from patient or surrogate and review of old charts     ____________________________________________   INITIAL IMPRESSION / ASSESSMENT AND PLAN / ED COURSE   Amber Aguilar was evaluated in Emergency Department on 01/22/2019 for the symptoms described in the history of present illness. She was evaluated in the context of the global COVID-19 pandemic, which necessitated consideration  that the patient might be at risk for infection with the SARS-CoV-2 virus that causes COVID-19. Institutional protocols and algorithms that pertain to the evaluation of patients at risk for COVID-19 are in a state of rapid change based on information released by regulatory bodies including the CDC and federal and state organizations. These policies and algorithms were followed during the patient's care in the ED.     Pt presents with SOB.  His most likely secondary to coronavirus.  Patient initially on a nonrebreather and converted over to high flow nasal cannula.  PNA-will get xray to evaluation Anemia-CBC to evaluate ACS- will get trops Arrhythmia-Will get EKG and keep on monitor.  COVID- will get testing per algorithm. PE-lower suspicion given no risk factors and other cause more likely  VBG is not showing signs of CO2 retention which is reassuring.  Lactate was normal.  Procalcitonin is elevated and patient does meet sepsis criteria so we will give a dose of broad-spectrum antibiotics to cover MRSA.  Patient also given a dose of Decadron given the Covid.  Patient given Tylenol for her fever.  Will hold off on full fluid resuscitation given this can be hurtful with coronavirus.  We will start with 500 cc and continue to closely monitor.  3:57 PM Patient looks much more comfortable on the high flow nasal cannula.  No ICU or stepdown beds available.  Will need to discuss with [facility] to see if they have anything open.  Discussed with hospital coordinator who states that I should talk with the hospitalist for anybody who is not intubated.  K low will add on mag. Give 20 IV K   Trop elevated at 255.  Will need to trend this out.  D-dimer also significantly elevated at 5000.  This is up from 500 a few days ago.  Given the troponin elevation of the D-dimer elevation and the high oxygen requirements and difficulties getting CT scan while on high flow nasal cannula will start patient  prophylactically with heparin to cover for the possibility of PE.  I discussed the hospital team and they agreed with this plan.  She denies any risk factors for bleeding and her hemoglobin is normal.  Discussed the CODE STATUS with patient.  Patient is still unsure and is going to discuss further with her family before making a decision.  I explained to patient that she is requiring a lot of oxygen and so things got worse that she would need intubated.  She understands that this decision needs to be made shortly in case things are worsening.   Discussed with the hospital team and will admit patient.    ____________________________________________   FINAL CLINICAL IMPRESSION(S) / ED DIAGNOSES   Final diagnoses:  Acute respiratory failure with hypoxia ([place])  COVID-19  Sepsis, due to unspecified organism, unspecified whether acute organ dysfunction present [facility])     MEDICATIONS GIVEN DURING THIS VISIT:  Medications  vancomycin (VANCOCIN) IVPB 1000 mg/200 mL premix (1,000 mg Intravenous New Bag/Given 01/22/19 1610)  ceFEPIme (MAXIPIME) 2 g in sodium chloride 0.9 % 100 mL IVPB (2 g Intravenous New Bag/Given 01/22/19 1637)  azithromycin (ZITHROMAX) 500 mg in sodium chloride 0.9 % 250 mL IVPB (has no administration in time range)  vancomycin (VANCOCIN) IVPB 1000 mg/200 mL premix (has no administration in time range)  dexamethasone (DECADRON) injection 6 mg (6 mg Intravenous Given 01/22/19 1523)  sodium chloride 0.9 % bolus 500 mL (500 mLs Intravenous New Bag/Given 01/22/19 1611)  acetaminophen (TYLENOL) tablet 1,000 mg (1,000 mg Oral Given 01/22/19 1608)     ED Discharge Orders    None       Note:  This document was prepared using Dragon voice recognition software and may include unintentional dictation errors.   Vanessa Lytle, MD 01/22/19 [phone]

## 2019-01-22 NOTE — Consult Note (Signed)
ANTICOAGULATION CONSULT NOTE   Pharmacy Consult for Heparin Drip Indication:  Presumed PE (Covid + and Elevated D-Dimer)   Allergies  Allergen Reactions  . Benzalkonium Chloride Other (See Comments)  . Neosporin [Neomycin-Bacitracin Zn-Polymyx]    Patient Measurements: Height: 5\' 2"  (157.5 cm) Weight: 199 lb (90.3 kg) IBW/kg (Calculated) : 50.1 Heparin Dosing Weight: 70.9kg  Vital Signs: Temp: 100 F (37.8 C) (12/12 1919) Temp Source: Oral (12/12 1919) BP: 103/37 (12/12 2100) Pulse Rate: 70 (12/12 2200)  Labs: Recent Labs    01/22/19 1502 01/22/19 1705 01/22/19 2301  HGB 12.2  --   --   HCT 36.4  --   --   PLT 448*  --   --   APTT  --  30  --   LABPROT  --  14.4  --   INR  --  1.1  --   HEPARINUNFRC  --   --  0.39  CREATININE 1.09*  --   --   TROPONINIHS 255* 365*  --     Estimated Creatinine Clearance: 56.6 mL/min (A) (by C-G formula based on SCr of 1.09 mg/dL (H)).   Medical History: Past Medical History:  Diagnosis Date  . Arthritis    rheumatoid  . CHF (congestive heart failure) ([place])   . COPD (chronic obstructive pulmonary disease) ([place])   . Diabetes mellitus without complication ([facility])     Medications:  No PTA anti-coagulation of record  Assessment: Amber Aguilar is a 63 y.o. female with CHF, COPD, diabetes, coronavirus diagnosed on 12/4 who comes in with shortness of breath.  Presumed PE (Covid + and Elevated D-Dimer)  12/12 2301 HL 0.39 therapeutic x 1  Goal of Therapy:  Heparin level 0.3-0.7 units/ml Monitor platelets by anticoagulation protocol: Yes   Plan:  Continue heparin at 1000 units/hr  Will recheck HL in 6 hours to confirm  CBC Daily while on heparin drip  Ena Dawley, PharmD Clinical Pharmacist 01/22/2019 11:54 PM

## 2019-01-22 NOTE — ED Notes (Signed)
Admitting at bedside 

## 2019-01-22 NOTE — ED Notes (Signed)
Requested new potassium order from Ilona Sorrel, MD.

## 2019-01-22 NOTE — Consult Note (Signed)
ANTICOAGULATION CONSULT NOTE - Initial Consult  Pharmacy Consult for Heparin Drip Indication:  Presumed PE (Covid + and Elevated D-Dimer)   Allergies  Allergen Reactions  . Benzalkonium Chloride Other (See Comments)  . Neosporin [Neomycin-Bacitracin Zn-Polymyx]    Patient Measurements: Height: 5\' 2"  (157.5 cm) Weight: 199 lb (90.3 kg) IBW/kg (Calculated) : 50.1 Heparin Dosing Weight: 70.9kg  Vital Signs: Temp: 102 F (38.9 C) (12/12 1528) Temp Source: Oral (12/12 1528) BP: 98/39 (12/12 1631) Pulse Rate: 110 (12/12 1631)  Labs: Recent Labs    01/22/19 1502  HGB 12.2  HCT 36.4  PLT 448*  CREATININE 1.09*  TROPONINIHS 255*    Estimated Creatinine Clearance: 56.6 mL/min (A) (by C-G formula based on SCr of 1.09 mg/dL (H)).   Medical History: Past Medical History:  Diagnosis Date  . Arthritis    rheumatoid  . CHF (congestive heart failure) ([place])   . COPD (chronic obstructive pulmonary disease) ([place])   . Diabetes mellitus without complication ([facility])     Medications:  No PTA anti-coagulation of record  Assessment: Amber Aguilar is a 63 y.o. female with CHF, COPD, diabetes, coronavirus diagnosed on 12/4 who comes in with shortness of breath.  Presumed PE (Covid + and Elevated D-Dimer)  Goal of Therapy:  Heparin level 0.3-0.7 units/ml Monitor platelets by anticoagulation protocol: Yes   Plan:  Baseline APTT/INR pending, will follow  Will give 4000 unit Heparin Bolus, followed by 1000 units/hr  Will check HL in 6 hours per protocol  CBC Daily while on heparin drip  Lu Duffel, PharmD, BCPS Clinical Pharmacist 01/22/2019 4:49 PM

## 2019-01-22 NOTE — ED Notes (Signed)
Date and time results received: 01/22/19 3:55 PM  (use smartphrase ".now" to insert current time)  Test: troponin Critical Value: 255  Name of Provider Notified: MD Jari Pigg

## 2019-01-22 NOTE — Consult Note (Signed)
PHARMACY -  BRIEF ANTIBIOTIC NOTE   Pharmacy has received consult(s) for Vancomycin/Cefepime from an ED provider.  The patient's profile has been reviewed for ht/wt/allergies/indication/available labs.    One time order(s) placed for Cefepime 2g x 1 and Vancomycin 1g IV x 1 by ED physician - will follow with 1g Vancomycin to compelte 2000mg  loading dose  Further antibiotics/pharmacy consults should be ordered by admitting physician if indicated.                       Thank you,  Lu Duffel, PharmD, BCPS Clinical Pharmacist 01/22/2019 4:07 PM

## 2019-01-22 NOTE — ED Notes (Signed)
Spoke with daughter at this time- requests update when pt is transferred.

## 2019-01-22 NOTE — ED Notes (Signed)
Alternative RN Anda Kraft) attempted IV x2 as well without success.

## 2019-01-22 NOTE — Consult Note (Signed)
Pharmacy Antibiotic Note  Amber Aguilar is a 63 y.o. female admitted on 01/22/2019 with sepsis.  Pharmacy has been consulted for Vancomycin/Cefepime dosing.  Plan: Will give Cefepime 2g q 12 hr  Pt will receive a total of 2000mg  Vancomycin IV in the ED as a loading dose, followed by  Vancomycin 1750 mg IV Q 48 hrs. Goal AUC 400-550. Expected AUC: 484 SCr used: 1.09   Height: 5\' 2"  (157.5 cm) Weight: 199 lb (90.3 kg) IBW/kg (Calculated) : 50.1  Temp (24hrs), Avg:101.3 F (38.5 C), Min:100.6 F (38.1 C), Max:102 F (38.9 C)  Recent Labs  Lab 01/18/19 1721 01/22/19 1502 01/22/19 1705  WBC 11.8* 16.6*  --   CREATININE 1.07* 1.09*  --   LATICACIDVEN  --  1.8 1.4    Estimated Creatinine Clearance: 56.6 mL/min (A) (by C-G formula based on SCr of 1.09 mg/dL (H)).    Allergies  Allergen Reactions  . Benzalkonium Chloride Other (See Comments)  . Neosporin [Neomycin-Bacitracin Zn-Polymyx]     Antimicrobials this admission: 12/12 Vancomycin >> 12/12 Cefepime >>  Dose adjustments this admission: None  Microbiology results: 12/12 BCx pending  Thank you for allowing pharmacy to be a part of this patient's care.  Lu Duffel, PharmD, BCPS Clinical Pharmacist 01/22/2019 6:29 PM

## 2019-01-22 NOTE — H&P (Addendum)
History and Physical:    Amber Aguilar   [id number] DOB: 04/08/1955 DOA: 01/22/2019  Referring MD/provider: Dr. Brock Ra PCP: System, Pcp Not In   Patient coming from: Home  Chief Complaint: Shortness of breath  History of Present Illness:   Amber Aguilar is an 63 y.o. female with medical history significant for rheumatoid arthritis, CHF, COPD, diabetes mellitus, hypertension, morbid obesity, who presented to the hospital because of increasing shortness of breath.  She tested positive coronavirus infection on 01/14/2019.  She said that she had been managing at home but she has been experiencing cough, pleuritic chest pain and shortness of breath.  She developed a fever today with a temperature of 101.3.  Shortness of breath worsened to the point that she called EMS.  Apparently, when EMS came, oxygen saturation was 70% on room air.  ED Course:  The patient was febrile with a temperature of 102, tachycardic and tachypneic.  According to ED physician, her oxygen saturation was 88% with nonrebreathing mask so she was placed on high flow nasal cannula.  When I saw her in the ED, her oxygen saturation was 93% on 50 L/min / 98% FiO2 on high flow nasal cannula.  Chest x-ray showed worsening bilateral infiltrates.  She was given IV remdesivir, azithromycin, cefepime and vancomycin.  She was also given IV heparin because of significantly elevated D-dimer level (5,150).  Ferritin is 796, LDH 681 and troponin 255.  ROS:   ROS all other systems reviewed were negative.  Past Medical History:   Past Medical History:  Diagnosis Date  . Arthritis    rheumatoid  . CHF (congestive heart failure) ([place])   . COPD (chronic obstructive pulmonary disease) ([place])   . Diabetes mellitus without complication [facility])     Past Surgical History:   Past Surgical History:  Procedure Laterality Date  . CARPAL TUNNEL RELEASE      Social History:   Social History   Socioeconomic History  . Marital  status: Married    Spouse name: Not on file  . Number of children: Not on file  . Years of education: Not on file  . Highest education level: Not on file  Occupational History  . Not on file  Tobacco Use  . Smoking status: Never Smoker  . Smokeless tobacco: Never Used  Substance and Sexual Activity  . Alcohol use: No  . Drug use: No  . Sexual activity: Not on file  Other Topics Concern  . Not on file  Social History Narrative  . Not on file   Social Determinants of Health   Financial Resource Strain:   . Difficulty of Paying Living Expenses: Not on file  Food Insecurity:   . Worried About Charity fundraiser in the Last Year: Not on file  . Ran Out of Food in the Last Year: Not on file  Transportation Needs:   . Lack of Transportation (Medical): Not on file  . Lack of Transportation (Non-Medical): Not on file  Physical Activity:   . Days of Exercise per Week: Not on file  . Minutes of Exercise per Session: Not on file  Stress:   . Feeling of Stress : Not on file  Social Connections:   . Frequency of Communication with Friends and Family: Not on file  . Frequency of Social Gatherings with Friends and Family: Not on file  . Attends Religious Services: Not on file  . Active Member of Clubs or Organizations: Not on file  .  Attends Archivist Meetings: Not on file  . Marital Status: Not on file  Intimate Partner Violence:   . Fear of Current or Ex-Partner: Not on file  . Emotionally Abused: Not on file  . Physically Abused: Not on file  . Sexually Abused: Not on file    Allergies   Benzalkonium chloride and Neosporin [neomycin-bacitracin zn-polymyx]  Family history:   History reviewed. No pertinent family history.  Current Medications:   Prior to Admission medications   Medication Sig Start Date End Date Taking? Authorizing Provider  acetaminophen (TYLENOL) 500 MG tablet Take 1 tablet (500 mg total) by mouth every 6 (six) hours as needed. 04/24/18    Laban Emperor, PA-C  albuterol (PROVENTIL) (2.5 MG/3ML) 0.083% nebulizer solution Take 2.5 mg by nebulization every 6 (six) hours as needed for wheezing or shortness of breath.    [provider]  atorvastatin (LIPITOR) 10 MG tablet Take 10 mg by mouth daily.    [provider]  chlorpheniramine-HYDROcodone (TUSSIONEX PENNKINETIC ER) 10-8 MG/5ML SUER Take 5 mLs by mouth 2 (two) times daily. 01/18/19   Earleen Newport, MD  fluticasone (FLONASE) 50 MCG/ACT nasal spray Place 1 spray into both nostrils daily.     [provider]  hydrochlorothiazide (HYDRODIURIL) 25 MG tablet Take 25 mg by mouth daily.    [provider]  hydrOXYzine (ATARAX/VISTARIL) 25 MG tablet Take 25 mg by mouth every 6 (six) hours as needed.  11/16/18   [provider]  metFORMIN (GLUCOPHAGE) 1000 MG tablet Take 1,000 mg by mouth daily with breakfast.    [provider]  Multiple Vitamin (MULTI-VITAMIN) tablet Take 1 tablet by mouth daily.     [provider]  mycophenolate (CELLCEPT) 500 MG tablet Take by mouth 2 (two) times daily.    [provider]  omeprazole (PRILOSEC) 20 MG capsule Take 20 mg by mouth daily.    [provider]  ondansetron (ZOFRAN ODT) 4 MG disintegrating tablet Take 1 tablet (4 mg total) by mouth every 8 (eight) hours as needed for nausea or vomiting. 01/18/19   Earleen Newport, MD  vitamin B-12 (CYANOCOBALAMIN) 1000 MCG tablet Take 1,000 mcg by mouth daily.     [provider]    Physical Exam:   Vitals:   01/22/19 1631 01/22/19 1715 01/22/19 1730 01/22/19 1732  BP: (!) 98/39     Pulse: (!) 110 100 94   Resp: (!) 32 19 (!) 24   Temp:    (!) 100.6 F (38.1 C)  TempSrc:    Oral  SpO2: 95% 97% 98%   Weight:      Height:         Physical Exam: Blood pressure (!) 98/39, pulse 94, temperature (!) 100.6 F (38.1 C), temperature source Oral, resp. rate (!) 24, height 5\' 2"  (1.575 m), weight 90.3 kg,  SpO2 98 %. Gen: No acute distress. Head: Normocephalic, atraumatic. Eyes: Pupils equal, round and reactive to light. Extraocular movements intact.  Sclerae nonicteric. No lid lag. Mouth: No lesions seen Neck: Supple, no thyromegaly, no lymphadenopathy, no jugular venous distention. Chest: Air entry adequate bilaterally, no wheezing heard but she has bibasilar rales. CV: Heart sounds are regular with an S1, S2. No murmurs, rubs, or gallops. Abdomen: Soft, nontender, nondistended with normal active bowel sounds. No hepatosplenomegaly or palpable masses. Extremities: Extremities are without clubbing, or cyanosis. No edema. Pedal pulses 2+. Skin: Warm and dry. No rashes, lesions or wounds. Neuro: Alert and oriented  times 3; grossly nonfocal.  Psych: Insight is good and judgment is appropriate. Mood and affect normal.   Data Review:    Labs: Basic Metabolic Panel: Recent Labs  Lab 01/18/19 1721 01/22/19 1502  NA 136 134*  K 3.1* 2.9*  CL 97* 95*  CO2 27 25  GLUCOSE 195* 143*  BUN 18 19  CREATININE 1.07* 1.09*  CALCIUM 9.2 8.4*   Liver Function Tests: Recent Labs  Lab 01/22/19 1502  AST 49*  ALT 32  ALKPHOS 52  BILITOT 0.7  PROT 7.0  ALBUMIN 3.3*   No results for input(s): LIPASE, AMYLASE in the last 168 hours. No results for input(s): AMMONIA in the last 168 hours. CBC: Recent Labs  Lab 01/18/19 1721 01/22/19 1502  WBC 11.8* 16.6*  NEUTROABS  --  13.4*  HGB 13.5 12.2  HCT 41.2 36.4  MCV 78.8* 77.8*  PLT 533* 448*   Cardiac Enzymes: No results for input(s): CKTOTAL, CKMB, CKMBINDEX, TROPONINI in the last 168 hours.  BNP (last 3 results) No results for input(s): PROBNP in the last 8760 hours. CBG: No results for input(s): GLUCAP in the last 168 hours.  Urinalysis    Component Value Date/Time   COLORURINE YELLOW (A) 01/14/2019 0540   APPEARANCEUR HAZY (A) 01/14/2019 0540   APPEARANCEUR Clear 10/29/2013 1941   LABSPEC >1.046 (H) 01/14/2019 0540    LABSPEC 1.018 10/29/2013 1941   PHURINE 6.0 01/14/2019 0540   GLUCOSEU 50 (A) 01/14/2019 0540   GLUCOSEU Negative 10/29/2013 1941   HGBUR NEGATIVE 01/14/2019 0540   BILIRUBINUR NEGATIVE 01/14/2019 0540   BILIRUBINUR Negative 10/29/2013 1941   KETONESUR 5 (A) 01/14/2019 0540   PROTEINUR 30 (A) 01/14/2019 0540   NITRITE NEGATIVE 01/14/2019 0540   LEUKOCYTESUR TRACE (A) 01/14/2019 0540   LEUKOCYTESUR Negative 10/29/2013 1941      Radiographic Studies: DG Chest Port 1 View  Result Date: 01/22/2019 CLINICAL DATA:  Increasing SOB that worsened today. Per ER notes: Per EMS pt had O2 sats of 70 when they arrived at her home and she had SOB and recent positive covid test.Hx of CHF, COPD, DM EXAM: PORTABLE CHEST 1 VIEW COMPARISON:  01/18/2019 and earlier exams. FINDINGS: Bilateral airspace lung opacities, which predominate in the lower lungs, have increased from prior exam. No convincing pleural effusion.  No pneumothorax. Cardiac silhouette is normal in size. IMPRESSION: 1. Interval worsening of lung aeration with increased bilateral airspace lung opacities. Findings consistent with worsened COVID-19 pneumonia. Electronically Signed   By: Lajean Manes M.D.   On: 01/22/2019 15:31    EKG: Independently reviewed.  Sinus tachycardia   Assessment/Plan:   Principal Problem:   Pneumonia due to COVID-19 virus Active Problems:   Acute hypoxemic respiratory failure (HCC)   Body mass index is 36.4 kg/m.   Sepsis secondary to COVID-19 pneumonia.  Cannot rule out bacterial pneumonia: Admit to stepdown unit.  Continue IV Remdesivir and IV Solu-Medrol for coronavirus infection.  Treat with empiric IV antibiotics.  Case was discussed with Dr.Kuber Sloan Leiter, hospitalist at [facility], and he has accepted the patient in transfer.  He said patient will be transferred to [facility] when a bed becomes available.  Acute hypoxemic respiratory failure: Continue oxygen via high flow nasal  cannula.  Hypokalemia: Replete potassium  Elevated troponin: Likely from demand ischemia.  Elevated D-dimer: This is likely due to COVID-19 pneumonia rather than acute PE. Continue heparin infusion for now until further evaluation with CTA of the chest when patient is  more stable.  Type 2 diabetes: Hold Metformin for now.  NovoLog as needed for hyperglycemia.  COPD: Bronchodilators as needed.  ?History of chronic diastolic CHF.  History of rheumatoid arthritis on CellCept.  Hold CellCept for now.    Other information:   DVT prophylaxis: Heparin Code Status: Full code Family Communication: None Disposition Plan: Full code Consults called: None Admission status: Inpatient  The medical decision making on this patient was of high complexity and the patient is at high risk for clinical deterioration, therefore this is a level 3 visit.    Time spent 60 minutes  [facility]   How to contact the [facility] Attending or Consulting provider [place] or covering provider during after hours [place], for this patient?   1. Check the care team in [facility] and look for a) attending/consulting [facility] provider listed and b) the [facility] team listed 2. Log into [email or web address] and use [place]'s universal password to access. If you do not have the password, please contact the hospital operator. 3. Locate the [facility] provider you are looking for under [facility] and page to a number that you can be directly reached. 4. If you still have difficulty reaching the provider, please page the [facility] (Director on Call) for the Hospitalists listed on amion for assistance.  01/22/2019, 6:00 PM

## 2019-01-22 NOTE — ED Notes (Signed)
Called for RN transport at this time. RN Ena Dawley to transport pt to CCU. Respiratory notified that pt is being moved.

## 2019-01-22 NOTE — Consult Note (Signed)
Remdesivir - Pharmacy Brief Note   O:  ALT: 32 CXR: evidence of lower respiratory infection on chest imaging SpO2: 95% on HFNC   A/P:  Remdesivir 200 mg IVPB once followed by 100 mg IVPB daily x 4 days.   Lu Duffel, PharmD, BCPS Clinical Pharmacist 01/22/2019 5:04 PM

## 2019-01-22 NOTE — Progress Notes (Signed)
Notified provider and bedside nurse of need to order fluid bolus. Secure chat sent to MD and RNs -- Im tracking the sepsis on this patient I see she has had 2 Map less than 65 recommending she recieve full fluid volume of 2771ml based of her documented weight  MD responded she is covid positive so do not want to gtive that all to her with normal lactate

## 2019-01-22 NOTE — ED Notes (Signed)
Update given to daughter Alvie Heidelberg- education regarding pt privacy given and suggested to her to pick a family spokesman to improve privacy and communication. Daughter states she will be the one to call for updates and inform the family.

## 2019-01-22 NOTE — ED Notes (Signed)
Called ICU to give report- unit states "it will be after change of shift."

## 2019-01-22 NOTE — ED Notes (Signed)
Pocket-book delivered to pt from charge desk. Pt transported to CCU with RN and RT at this time.

## 2019-01-22 NOTE — ED Notes (Signed)
Report to [place], RN at this.

## 2019-01-22 NOTE — ED Notes (Signed)
MD notified of pt's low o2 sats and respiratory assessment- MD ordered for high flow [place]. RT called at this time and notified.

## 2019-01-22 NOTE — Progress Notes (Signed)
Patient's BP is soft as indicated in the flow sheet and this RN has paged [facility] NP for NS infusion. Still awaiting a call back. Will continue to monitor.

## 2019-01-22 NOTE — ED Triage Notes (Signed)
Per EMS pt had O2 sats of 70 when they arrived at her home and she had SOB and recent positive covid test. Pt c/o increased SOB that got worse today. Pt denies other s/s. Pt aox4. Appears sob and tripoding upon arrival.

## 2019-01-22 NOTE — ED Notes (Signed)
X-ray at bedside

## 2019-01-22 NOTE — ED Notes (Signed)
Husband Wille Glaser [phone]

## 2019-01-23 ENCOUNTER — Inpatient Hospital Stay (HOSPITAL_COMMUNITY)
Admission: AD | Admit: 2019-01-23 | Discharge: 2019-01-29 | DRG: 177 | Disposition: A | Discharge status: Home / Self Care | Payer: 59 | Source: Other Acute Inpatient Hospital | Attending: Internal Medicine | Admitting: Internal Medicine

## 2019-01-23 ENCOUNTER — Other Ambulatory Visit: Payer: Self-pay

## 2019-01-23 ENCOUNTER — Encounter (HOSPITAL_COMMUNITY): Payer: Self-pay | Admitting: Internal Medicine

## 2019-01-23 DIAGNOSIS — I5032 Chronic diastolic (congestive) heart failure: Secondary | ICD-10-CM | POA: Diagnosis present

## 2019-01-23 DIAGNOSIS — Z6835 Body mass index (BMI) 35.0-35.9, adult: Secondary | ICD-10-CM | POA: Diagnosis not present

## 2019-01-23 DIAGNOSIS — E785 Hyperlipidemia, unspecified: Secondary | ICD-10-CM | POA: Diagnosis present

## 2019-01-23 DIAGNOSIS — K219 Gastro-esophageal reflux disease without esophagitis: Secondary | ICD-10-CM | POA: Diagnosis present

## 2019-01-23 DIAGNOSIS — I110 Hypertensive heart disease with heart failure: Secondary | ICD-10-CM | POA: Diagnosis present

## 2019-01-23 DIAGNOSIS — J1289 Other viral pneumonia: Secondary | ICD-10-CM | POA: Diagnosis not present

## 2019-01-23 DIAGNOSIS — E1165 Type 2 diabetes mellitus with hyperglycemia: Secondary | ICD-10-CM | POA: Diagnosis not present

## 2019-01-23 DIAGNOSIS — T380X5A Adverse effect of glucocorticoids and synthetic analogues, initial encounter: Secondary | ICD-10-CM | POA: Diagnosis not present

## 2019-01-23 DIAGNOSIS — U071 COVID-19: Secondary | ICD-10-CM | POA: Diagnosis not present

## 2019-01-23 DIAGNOSIS — E119 Type 2 diabetes mellitus without complications: Secondary | ICD-10-CM | POA: Diagnosis not present

## 2019-01-23 DIAGNOSIS — J159 Unspecified bacterial pneumonia: Secondary | ICD-10-CM | POA: Diagnosis present

## 2019-01-23 DIAGNOSIS — J9601 Acute respiratory failure with hypoxia: Secondary | ICD-10-CM | POA: Diagnosis not present

## 2019-01-23 DIAGNOSIS — M069 Rheumatoid arthritis, unspecified: Secondary | ICD-10-CM | POA: Diagnosis present

## 2019-01-23 DIAGNOSIS — Z79899 Other long term (current) drug therapy: Secondary | ICD-10-CM | POA: Diagnosis not present

## 2019-01-23 DIAGNOSIS — E11649 Type 2 diabetes mellitus with hypoglycemia without coma: Secondary | ICD-10-CM | POA: Diagnosis not present

## 2019-01-23 DIAGNOSIS — E669 Obesity, unspecified: Secondary | ICD-10-CM | POA: Diagnosis present

## 2019-01-23 DIAGNOSIS — J440 Chronic obstructive pulmonary disease with acute lower respiratory infection: Secondary | ICD-10-CM | POA: Diagnosis present

## 2019-01-23 DIAGNOSIS — R7989 Other specified abnormal findings of blood chemistry: Secondary | ICD-10-CM | POA: Diagnosis not present

## 2019-01-23 DIAGNOSIS — J189 Pneumonia, unspecified organism: Secondary | ICD-10-CM | POA: Diagnosis not present

## 2019-01-23 DIAGNOSIS — J449 Chronic obstructive pulmonary disease, unspecified: Secondary | ICD-10-CM | POA: Diagnosis not present

## 2019-01-23 DIAGNOSIS — B370 Candidal stomatitis: Secondary | ICD-10-CM | POA: Diagnosis not present

## 2019-01-23 DIAGNOSIS — J849 Interstitial pulmonary disease, unspecified: Secondary | ICD-10-CM | POA: Diagnosis present

## 2019-01-23 DIAGNOSIS — Z888 Allergy status to other drugs, medicaments and biological substances status: Secondary | ICD-10-CM

## 2019-01-23 LAB — BASIC METABOLIC PANEL
Anion gap: 10 (ref 5–15)
BUN: 15 mg/dL (ref 8–23)
CO2: 26 mmol/L (ref 22–32)
Calcium: 8 mg/dL — ABNORMAL LOW (ref 8.9–10.3)
Chloride: 103 mmol/L (ref 98–111)
Creatinine, Ser: 0.64 mg/dL (ref 0.44–1.00)
GFR calc Af Amer: >60 mL/min (ref >60)
GFR calc non Af Amer: >60 mL/min (ref >60)
Glucose, Bld: 165 mg/dL — ABNORMAL HIGH (ref 70–99)
Potassium: 3.5 mmol/L (ref 3.5–5.1)
Sodium: 139 mmol/L (ref 135–145)

## 2019-01-23 LAB — CBC
HCT: 35.2 % — ABNORMAL LOW (ref 36.0–46.0)
Hemoglobin: 11.5 g/dL — ABNORMAL LOW (ref 12.0–15.0)
MCH: 25.9 pg — ABNORMAL LOW (ref 26.0–34.0)
MCHC: 32.7 g/dL (ref 30.0–36.0)
MCV: 79.3 fL — ABNORMAL LOW (ref 80.0–100.0)
Platelets: 385 10*3/uL (ref 150–400)
RBC: 4.44 MIL/uL (ref 3.87–5.11)
RDW: 13.4 % (ref 11.5–15.5)
WBC: 10 10*3/uL (ref 4.0–10.5)
nRBC: 0 % (ref 0.0–0.2)

## 2019-01-23 LAB — HEPARIN LEVEL (UNFRACTIONATED): Heparin Unfractionated: 0.56 IU/mL (ref 0.30–0.70)

## 2019-01-23 LAB — MRSA PCR SCREENING: MRSA by PCR: NEGATIVE

## 2019-01-23 LAB — GLUCOSE, CAPILLARY: Glucose-Capillary: 300 mg/dL — ABNORMAL HIGH (ref 70–99)

## 2019-01-23 MED ORDER — PNEUMOCOCCAL VAC POLYVALENT 25 MCG/0.5ML IJ INJ
0.5000 mL | INJECTION | INTRAMUSCULAR | Status: DC
  Filled 2019-01-23: qty 0.5

## 2019-01-23 MED ORDER — METHYLPREDNISOLONE SODIUM SUCC 40 MG IJ SOLR: 40.0000 mg | Freq: Two times a day (BID) | INTRAMUSCULAR | 0 refills | Status: DC

## 2019-01-23 MED ORDER — HYDROCOD POLST-CPM POLST ER 10-8 MG/5ML PO SUER: 5.0000 mL | Freq: Two times a day (BID) | ORAL | Status: DC | PRN

## 2019-01-23 MED ORDER — ENOXAPARIN SODIUM 60 MG/0.6ML ~~LOC~~ SOLN: 0.5000 mg/kg | Freq: Two times a day (BID) | SUBCUTANEOUS | Status: DC

## 2019-01-23 MED ORDER — ZINC SULFATE 220 (50 ZN) MG PO CAPS
220.0000 mg | ORAL_CAPSULE | Freq: Every day | ORAL | Status: DC
  Administered 2019-01-23: 220 mg via ORAL
  Filled 2019-01-23: qty 1

## 2019-01-23 MED ORDER — VANCOMYCIN HCL 10 G IV SOLR: 1250.0000 mg | INTRAVENOUS | Status: DC

## 2019-01-23 MED ORDER — METHYLPREDNISOLONE SODIUM SUCC 40 MG IJ SOLR
40.0000 mg | Freq: Two times a day (BID) | INTRAMUSCULAR | Status: DC
  Administered 2019-01-24 – 2019-01-26 (×5): 40 mg via INTRAVENOUS
  Filled 2019-01-23 (×6): qty 1

## 2019-01-23 MED ORDER — ONDANSETRON HCL 4 MG/2ML IJ SOLN: 4.0000 mg | Freq: Four times a day (QID) | INTRAMUSCULAR | Status: DC | PRN

## 2019-01-23 MED ORDER — ATORVASTATIN CALCIUM 10 MG PO TABS
10.0000 mg | ORAL_TABLET | Freq: Every day | ORAL | Status: DC
  Administered 2019-01-24 – 2019-01-29 (×6): 10 mg via ORAL
  Filled 2019-01-23 (×6): qty 1

## 2019-01-23 MED ORDER — ZINC SULFATE 220 (50 ZN) MG PO CAPS: 220.0000 mg | ORAL_CAPSULE | Freq: Every day | ORAL | Status: DC

## 2019-01-23 MED ORDER — SODIUM CHLORIDE 0.9 % IV SOLN
2.0000 g | Freq: Three times a day (TID) | INTRAVENOUS | Status: DC
  Administered 2019-01-23 (×3): 2 g via INTRAVENOUS
  Filled 2019-01-23 (×5): qty 2

## 2019-01-23 MED ORDER — VITAMIN C 500 MG PO TABS
500.0000 mg | ORAL_TABLET | Freq: Every day | ORAL | Status: DC
  Administered 2019-01-24 – 2019-01-29 (×6): 500 mg via ORAL
  Filled 2019-01-23 (×6): qty 1

## 2019-01-23 MED ORDER — HEPARIN (PORCINE) 25000 UT/250ML-% IV SOLN: 1000.0000 [IU]/h | INTRAVENOUS | Status: DC

## 2019-01-23 MED ORDER — GUAIFENESIN-DM 100-10 MG/5ML PO SYRP
10.0000 mL | ORAL_SOLUTION | ORAL | Status: DC | PRN
  Administered 2019-01-24: 10 mL via ORAL
  Filled 2019-01-23: qty 10

## 2019-01-23 MED ORDER — VITAMIN C 500 MG PO TABS
500.0000 mg | ORAL_TABLET | Freq: Every day | ORAL | Status: DC
  Administered 2019-01-23: 500 mg via ORAL
  Filled 2019-01-23: qty 1

## 2019-01-23 MED ORDER — ASCORBIC ACID 500 MG PO TABS: 500.0000 mg | ORAL_TABLET | Freq: Every day | ORAL | Status: AC

## 2019-01-23 MED ORDER — HYDROXYZINE HCL 25 MG PO TABS
25.0000 mg | ORAL_TABLET | Freq: Four times a day (QID) | ORAL | Status: DC | PRN
  Filled 2019-01-23: qty 1

## 2019-01-23 MED ORDER — CHLORHEXIDINE GLUCONATE CLOTH 2 % EX PADS: 6.0000 | MEDICATED_PAD | Freq: Every day | CUTANEOUS | Status: DC

## 2019-01-23 MED ORDER — SODIUM CHLORIDE 0.9 % IV SOLN
100.0000 mg | Freq: Every day | INTRAVENOUS | Status: AC
  Administered 2019-01-24 – 2019-01-26 (×3): 100 mg via INTRAVENOUS
  Filled 2019-01-23 (×2): qty 20

## 2019-01-23 MED ORDER — ENOXAPARIN SODIUM 40 MG/0.4ML ~~LOC~~ SOLN: 40.0000 mg | SUBCUTANEOUS | Status: DC

## 2019-01-23 MED ORDER — ENOXAPARIN SODIUM 60 MG/0.6ML ~~LOC~~ SOLN
0.5000 mg/kg | Freq: Two times a day (BID) | SUBCUTANEOUS | Status: DC
  Administered 2019-01-24 – 2019-01-25 (×4): 45 mg via SUBCUTANEOUS
  Filled 2019-01-23 (×4): qty 0.6

## 2019-01-23 MED ORDER — ACETAMINOPHEN 325 MG PO TABS: 650.0000 mg | ORAL_TABLET | Freq: Four times a day (QID) | ORAL | Status: DC | PRN

## 2019-01-23 MED ORDER — ZINC SULFATE 220 (50 ZN) MG PO CAPS
220.0000 mg | ORAL_CAPSULE | Freq: Every day | ORAL | Status: DC
  Administered 2019-01-24 – 2019-01-29 (×6): 220 mg via ORAL
  Filled 2019-01-23 (×6): qty 1

## 2019-01-23 MED ORDER — HYDROCHLOROTHIAZIDE 25 MG PO TABS
25.0000 mg | ORAL_TABLET | Freq: Every day | ORAL | Status: DC
  Filled 2019-01-23: qty 1

## 2019-01-23 MED ORDER — DOCUSATE SODIUM 100 MG PO CAPS
100.0000 mg | ORAL_CAPSULE | Freq: Every day | ORAL | Status: DC
  Administered 2019-01-24 – 2019-01-29 (×4): 100 mg via ORAL
  Filled 2019-01-23 (×5): qty 1

## 2019-01-23 MED ORDER — PANTOPRAZOLE SODIUM 40 MG PO TBEC
40.0000 mg | DELAYED_RELEASE_TABLET | Freq: Every day | ORAL | Status: DC
  Administered 2019-01-24 – 2019-01-29 (×6): 40 mg via ORAL
  Filled 2019-01-23 (×7): qty 1

## 2019-01-23 MED ORDER — VANCOMYCIN HCL 10 G IV SOLR
1250.0000 mg | INTRAVENOUS | Status: DC
  Administered 2019-01-23: 1250 mg via INTRAVENOUS
  Filled 2019-01-23: qty 1250

## 2019-01-23 MED ORDER — IPRATROPIUM-ALBUTEROL 20-100 MCG/ACT IN AERS
1.0000 | INHALATION_SPRAY | Freq: Four times a day (QID) | RESPIRATORY_TRACT | Status: DC
  Administered 2019-01-23 – 2019-01-29 (×24): 1 via RESPIRATORY_TRACT
  Filled 2019-01-23: qty 4

## 2019-01-23 MED ORDER — ONDANSETRON HCL 4 MG PO TABS: 4.0000 mg | ORAL_TABLET | Freq: Four times a day (QID) | ORAL | Status: DC | PRN

## 2019-01-23 MED ORDER — SODIUM CHLORIDE 0.9 % IV SOLN: 500.0000 mg | INTRAVENOUS | Status: DC

## 2019-01-23 MED ORDER — SODIUM CHLORIDE 0.9 % IV SOLN: 2.0000 g | Freq: Three times a day (TID) | INTRAVENOUS | Status: DC

## 2019-01-23 NOTE — Progress Notes (Signed)
Attempted to call report to [facility] 8 times with no answer. Once I got through to the secretary, she stated that they could not take report at this time due to shift change. Awaiting call back from [facility].

## 2019-01-23 NOTE — Progress Notes (Signed)
Report was given to [facility] at this time. Pt is being placed on nonrebreather.

## 2019-01-23 NOTE — Progress Notes (Signed)
BP=91/74, paged the [facility] NP the 2nd time for oders and I haven't heard from her. Will continue to monitor.

## 2019-01-23 NOTE — Progress Notes (Signed)
Gerald Stabs from [facility] updated. Pt night time medications given, pt voided and had BM, [facility] has been notified that pt is maintaining oxygen saturation on nonrebreather.

## 2019-01-23 NOTE — Consult Note (Signed)
Pharmacy Antibiotic Note  Amber Aguilar is a 63 y.o. female admitted on 01/22/2019 with sepsis.  Pharmacy has been consulted for Vancomycin/Cefepime dosing.  Plan: Increase cefepime to 2 g IV q8h   Change vancomycin to 1250 mg IV Q 24 hrs  Goal AUC 400-550 Expected AUC: 523 SCr used: 0.8   Height: 5\' 2"  (157.5 cm) Weight: 199 lb (90.3 kg) IBW/kg (Calculated) : 50.1  Temp (24hrs), Avg:100.1 F (37.8 C), Min:97.7 F (36.5 C), Max:102 F (38.9 C)  Recent Labs  Lab 01/18/19 1721 01/22/19 1502 01/22/19 1705 01/23/19 0656  WBC 11.8* 16.6*  --  10.0  CREATININE 1.07* 1.09*  --  0.64  LATICACIDVEN  --  1.8 1.4  --     Estimated Creatinine Clearance: 77.2 mL/min (by C-G formula based on SCr of 0.64 mg/dL).    Allergies  Allergen Reactions  . Benzalkonium Chloride Other (See Comments)  . Neosporin [Neomycin-Bacitracin Zn-Polymyx]     Antimicrobials this admission: 12/12 Vancomycin >> 12/12 Cefepime >>  Dose adjustments this admission: 12/13 Vanc 1750 mg q48h >> 1250 mg q24h  Microbiology results: 12/12 BCx: NGTD 12/12 MRSA PCR: negative  Thank you for allowing pharmacy to be a part of this patient's care.  Tawnya Crook, PharmD Clinical Pharmacist 01/23/2019 8:23 AM

## 2019-01-23 NOTE — Progress Notes (Signed)
  Patient running soft Bp however he is still maintaining MAP>65. Given current Covid and hx of CHF we will avoid IVFs bolus to avoid risk of pulmonary edema/acute lung injury. He currently appears hemodynamically stable and asymptomatic. Will continue to monitor and keep MAP>65.    Rufina Falco, DNP, CCRN, FNP-C [facility] Nurse Practitioner Between 7pm to [place] - Pager [phone] Actively using [facility] secure chat messaging  After 7am go to [email or web address] - password:[email or web address] select [facility]  [facility] [employer]  [phone]

## 2019-01-23 NOTE — Consult Note (Signed)
ANTICOAGULATION CONSULT NOTE   Pharmacy Consult for Heparin Drip Indication:  Presumed PE (Covid + and Elevated D-Dimer)   Allergies  Allergen Reactions  . Benzalkonium Chloride Other (See Comments)  . Neosporin [Neomycin-Bacitracin Zn-Polymyx]    Patient Measurements: Height: 5\' 2"  (157.5 cm) Weight: 199 lb (90.3 kg) IBW/kg (Calculated) : 50.1 Heparin Dosing Weight: 70.9kg  Vital Signs: Temp: 97.7 F (36.5 C) (12/12 2302) Temp Source: Oral (12/12 2302) BP: 120/66 (12/13 0400) Pulse Rate: 70 (12/13 0400)  Labs: Recent Labs    01/22/19 1502 01/22/19 1705 01/22/19 2301 01/23/19 0656  HGB 12.2  --   --  11.5*  HCT 36.4  --   --  35.2*  PLT 448*  --   --  385  APTT  --  30  --   --   LABPROT  --  14.4  --   --   INR  --  1.1  --   --   HEPARINUNFRC  --   --  0.39 0.56  CREATININE 1.09*  --   --  0.64  TROPONINIHS 255* 365*  --   --     Estimated Creatinine Clearance: 77.2 mL/min (by C-G formula based on SCr of 0.64 mg/dL).   Medical History: Past Medical History:  Diagnosis Date  . Arthritis    rheumatoid  . CHF (congestive heart failure) ([place])   . COPD (chronic obstructive pulmonary disease) ([place])   . Diabetes mellitus without complication (HCC)     Medications:  No PTA anti-coagulation of record  Assessment: Amber Aguilar is a 63 y.o. female with CHF, COPD, diabetes, coronavirus diagnosed on 12/4 who comes in with shortness of breath.  Presumed PE (Covid + and Elevated D-Dimer)  12/12 2301 HL 0.39 therapeutic x 1 12/13 0656 HL 0.56 therapeutic x 2  Goal of Therapy:  Heparin level 0.3-0.7 units/ml Monitor platelets by anticoagulation protocol: Yes   Plan:  12/13 0656 HL 0.56 therapeutic x 2. Continue heparin drip at 1000 units/hr.  CBC/HL daily while on heparin drip.  Tawnya Crook, PharmD Clinical Pharmacist 01/23/2019 7:58 AM

## 2019-01-23 NOTE — Progress Notes (Addendum)
PROGRESS NOTE    Amber Aguilar  [id number] DOB: January 13, 1956 DOA: 01/22/2019 PCP: System, Pcp Not In   Brief Narrative:  Amber Aguilar is an 63 y.o. female with medical history significant for rheumatoid arthritis, CHF, COPD, diabetes mellitus, hypertension, morbid obesity, who presented to the hospital because of increasing shortness of breath.  She tested positive coronavirus infection on 01/14/2019.  She said that she had been managing at home but she has been experiencing cough, pleuritic chest pain and shortness of breath.   Patient was febrile and hypoxic on arrival.  Found to be COVID-19 positive with elevated inflammatory markers.  Requiring up to 50 letter with high flow. Waiting for transfer to [facility].  Subjective: Patient continued to experience shortness of breath, stating it is little better than before.  Still on 50 L.  Assessment & Plan:   Principal Problem:   Pneumonia due to COVID-19 virus Active Problems:   Acute hypoxemic respiratory failure ([place])  Pneumonia due to COVID-19 virus. Acute hypoxemic respiratory failure ([place]). Most likely secondary to COVID-19 pneumonia. D-dimer above 5000 with elevated CRP at 12.8. Calcitonin markedly elevated at 32.96. Elevated troponin most likely demand ischemia. Patient was started on heparin infusion with the plan to do CTA once stable to rule out PE. Blood culture remain negative. -Continue cefepime and vancomycin for superadded bacterial infection. -Continue remdesivir. -Continue Solu Medrol -Continue oxygen via high flow nasal cannula to maintain saturation above 90%. -Patient is waiting bed at [facility].  Diabetes.  Holding home Metformin. -Continue with SSI.  COPD.  No wheezing at this time.   -Continue bronchodilators as needed.  History of diastolic heart failure.  Does not appear volume overload.  History of rheumatoid arthritis.  Patient was on CellCept at home.  No active flareup of her rheumatoid arthritis.  Per  up-to-date recommendation we will hold CellCept at this time.  Objective: Vitals:   01/23/19 0830 01/23/19 0900 01/23/19 1000 01/23/19 1100  BP:  (!) 107/45 (!) 115/57 123/61  Pulse:  71 71 80  Resp:  19 19 20   Temp:      TempSrc:      SpO2: 95% (!) 88% 97% 94%  Weight:      Height:        Intake/Output Summary (Last 24 hours) at 01/23/2019 1314 Last data filed at 01/23/2019 1200 Gross per 24 hour  Intake 1957.63 ml  Output --  Net 1957.63 ml   Filed Weights   01/22/19 1447  Weight: 90.3 kg    Examination:  General exam: Appears calm and comfortable  Respiratory system: Bilateral scattered crackles, no wheeze, using accessory muscle. Cardiovascular system: S1 & S2 heard, RRR. No JVD, murmurs, rubs, gallops or clicks. Gastrointestinal system: Soft, nontender, nondistended, bowel sounds positive. Central nervous system: Alert and oriented. No focal neurological deficits.Symmetric 5 x 5 power. Extremities: No edema, no cyanosis, pulses intact and symmetrical. Skin: No rashes, lesions or ulcers Psychiatry: Judgement and insight appear normal. Mood & affect appropriate.    DVT prophylaxis: Heparin Code Status: Full Family Communication: No family at bedside, updated husband on phone. Disposition Plan: Transfer to [place] once bed become available. Patient has an increase risk for deterioration.  Consultants:   PCCM  Procedures:  Antimicrobials:   Data Reviewed: I have personally reviewed following labs and imaging studies  CBC: Recent Labs  Lab 01/18/19 1721 01/22/19 1502 01/23/19 0656  WBC 11.8* 16.6* 10.0  NEUTROABS  --  13.4*  --   HGB 13.5  12.2 11.5*  HCT 41.2 36.4 35.2*  MCV 78.8* 77.8* 79.3*  PLT 533* 448* 0000000   Basic Metabolic Panel: Recent Labs  Lab 01/18/19 1721 01/22/19 1502 01/22/19 1705 01/23/19 0656  NA 136 134*  --  139  K 3.1* 2.9*  --  3.5  CL 97* 95*  --  103  CO2 27 25  --  26  GLUCOSE 195* 143*  --  165*  BUN 18 19  --  15   CREATININE 1.07* 1.09*  --  0.64  CALCIUM 9.2 8.4*  --  8.0*  MG  --   --  2.3  --    GFR: Estimated Creatinine Clearance: 77.2 mL/min (by C-G formula based on SCr of 0.64 mg/dL). Liver Function Tests: Recent Labs  Lab 01/22/19 1502  AST 49*  ALT 32  ALKPHOS 52  BILITOT 0.7  PROT 7.0  ALBUMIN 3.3*   No results for input(s): LIPASE, AMYLASE in the last 168 hours. No results for input(s): AMMONIA in the last 168 hours. Coagulation Profile: Recent Labs  Lab 01/22/19 1705  INR 1.1   Cardiac Enzymes: No results for input(s): CKTOTAL, CKMB, CKMBINDEX, TROPONINI in the last 168 hours. BNP (last 3 results) No results for input(s): PROBNP in the last 8760 hours. HbA1C: No results for input(s): HGBA1C in the last 72 hours. CBG: No results for input(s): GLUCAP in the last 168 hours. Lipid Profile: Recent Labs    01/22/19 1502  TRIG 114   Thyroid Function Tests: No results for input(s): TSH, T4TOTAL, FREET4, T3FREE, THYROIDAB in the last 72 hours. Anemia Panel: Recent Labs    01/22/19 1502  FERRITIN 796*   Sepsis Labs: Recent Labs  Lab 01/22/19 1502 01/22/19 1705  PROCALCITON 32.96  --   LATICACIDVEN 1.8 1.4    Recent Results (from the past 240 hour(s))  Culture, blood (Routine x 2)     Status: None   Collection Time: 01/13/19  8:30 PM   Specimen: BLOOD  Result Value Ref Range Status   Specimen Description BLOOD Blood Culture adequate volume  Final   Special Requests   Final    BOTTLES DRAWN AEROBIC AND ANAEROBIC LEFT ANTECUBITAL   Culture   Final    NO GROWTH 5 DAYS Performed at [facility], [address] [place], [place] [address]    Report Status 01/18/2019 FINAL  Final  SARS CORONAVIRUS 2 (TAT 6-24 HRS) Nasopharyngeal Nasopharyngeal Swab     Status: Abnormal   Collection Time: 01/14/19  4:35 AM   Specimen: Nasopharyngeal Swab  Result Value Ref Range Status   SARS Coronavirus 2 POSITIVE (A) NEGATIVE Final    Comment: RESULT CALLED TO,  READ BACK BY AND VERIFIED WITH: T.TECOLA RN 1933 01/14/2019 MCCORMICK K (NOTE) SARS-CoV-2 target nucleic acids are DETECTED. The SARS-CoV-2 RNA is generally detectable in upper and lower respiratory specimens during the acute phase of infection. Positive results are indicative of the presence of SARS-CoV-2 RNA. Clinical correlation with patient history and other diagnostic information is  necessary to determine patient infection status. Positive results do not rule out bacterial infection or co-infection with other viruses.  The expected result is Negative. Fact Sheet for Patients: SugarRoll.be Fact Sheet for Healthcare Providers: https://www.woods-mathews.com/ This test is not yet approved or cleared by the [place] FDA and  has been authorized for detection and/or diagnosis of SARS-CoV-2 by FDA under an Emergency Use Authorization (EUA). This EUA will remain  in effect (meaning this test can be used)  for  the duration of the COVID-19 declaration under Section 564(b)(1) of the Act, 21 U.S.C. section 360bbb-3(b)(1), unless the authorization is terminated or revoked sooner. Performed at [facility], [place] [address] [place], [place] [address]   Blood Culture (routine x 2)     Status: None (Preliminary result)   Collection Time: 01/22/19  3:01 PM   Specimen: BLOOD  Result Value Ref Range Status   Specimen Description BLOOD RIGHT ANTECUBITAL  Final   Special Requests   Final    BOTTLES DRAWN AEROBIC AND ANAEROBIC Blood Culture adequate volume   Culture   Final    NO GROWTH < 24 HOURS Performed at [facility], [address], [place], [place] [address]    Report Status PENDING  Incomplete  Blood Culture (routine x 2)     Status: None (Preliminary result)   Collection Time: 01/22/19  3:07 PM   Specimen: BLOOD  Result Value Ref Range Status   Specimen Description BLOOD LEFT ANTECUBITAL  Final   Special Requests   Final      BOTTLES DRAWN AEROBIC AND ANAEROBIC Blood Culture adequate volume   Culture   Final    NO GROWTH < 24 HOURS Performed at [facility], [address], [place], [place] [address]    Report Status PENDING  Incomplete  MRSA PCR Screening     Status: None   Collection Time: 01/22/19 11:44 PM   Specimen: Nasal Mucosa; Nasopharyngeal  Result Value Ref Range Status   MRSA by PCR NEGATIVE NEGATIVE Final    Comment:        The GeneXpert MRSA Assay (FDA approved for NASAL specimens only), is one component of a comprehensive MRSA colonization surveillance program. It is not intended to diagnose MRSA infection nor to guide or monitor treatment for MRSA infections. Performed at [facility], [address] [place], [place] [address]      Radiology Studies: [facility] Chest [place] 1 View  Result Date: 01/22/2019 CLINICAL DATA:  Increasing SOB that worsened today. Per ER notes: Per EMS pt had O2 sats of 70 when they arrived at her home and she had SOB and recent positive covid test.Hx of CHF, COPD, DM EXAM: PORTABLE CHEST 1 VIEW COMPARISON:  01/18/2019 and earlier exams. FINDINGS: Bilateral airspace lung opacities, which predominate in the lower lungs, have increased from prior exam. No convincing pleural effusion.  No pneumothorax. Cardiac silhouette is normal in size. IMPRESSION: 1. Interval worsening of lung aeration with increased bilateral airspace lung opacities. Findings consistent with worsened COVID-19 pneumonia. Electronically Signed   By: Lajean Manes M.D.   On: 01/22/2019 15:31    Scheduled Meds: . atorvastatin  10 mg Oral Daily  . Chlorhexidine Gluconate Cloth  6 each Topical Daily  . fluticasone  1 spray Each Nare Daily  . methylPREDNISolone (SOLU-MEDROL) injection  40 mg Intravenous Q12H  . mycophenolate  500 mg Oral BID  . pantoprazole  40 mg Oral Daily  . vitamin B-12  1,000 mcg Oral Daily  . vitamin C  500 mg Oral Daily  . zinc sulfate  220 mg Oral Daily    Continuous Infusions: . azithromycin    . ceFEPime (MAXIPIME) IV Stopped (01/23/19 0943)  . heparin 1,000 Units/hr (01/23/19 1200)  . remdesivir 100 mg in NS 100 mL Stopped (01/23/19 1020)  . vancomycin       LOS: 1 day   Time spent: 50 minutes.  Lorella Nimrod, MD [facility] Pager [phone]  If 7PM-7AM, please contact night-coverage [email or web address] Password [facility] 01/23/2019, 1:14 PM   This record has been created using Systems analyst. Errors have been sought and corrected,but may not always be located. Such creation errors do not reflect on the standard of care.

## 2019-01-23 NOTE — Progress Notes (Signed)
Pt. With positive COVID 19 Pneumonia requiring 50L with HFNC. Vitals stable.

## 2019-01-24 DIAGNOSIS — J9601 Acute respiratory failure with hypoxia: Secondary | ICD-10-CM

## 2019-01-24 DIAGNOSIS — E119 Type 2 diabetes mellitus without complications: Secondary | ICD-10-CM

## 2019-01-24 DIAGNOSIS — U071 COVID-19: Principal | ICD-10-CM

## 2019-01-24 DIAGNOSIS — I5032 Chronic diastolic (congestive) heart failure: Secondary | ICD-10-CM

## 2019-01-24 DIAGNOSIS — J189 Pneumonia, unspecified organism: Secondary | ICD-10-CM

## 2019-01-24 LAB — FERRITIN: Ferritin: 874 ng/mL — ABNORMAL HIGH (ref 11–307)

## 2019-01-24 LAB — CBC WITH DIFFERENTIAL/PLATELET
Abs Immature Granulocytes: 0.07 10*3/uL (ref 0.00–0.07)
Basophils Absolute: 0 10*3/uL (ref 0.0–0.1)
Basophils Relative: 0 %
Eosinophils Absolute: 0 10*3/uL (ref 0.0–0.5)
Eosinophils Relative: 0 %
HCT: 34 % — ABNORMAL LOW (ref 36.0–46.0)
Hemoglobin: 10.8 g/dL — ABNORMAL LOW (ref 12.0–15.0)
Immature Granulocytes: 1 %
Lymphocytes Relative: 14 %
Lymphs Abs: 1.3 10*3/uL (ref 0.7–4.0)
MCH: 25.7 pg — ABNORMAL LOW (ref 26.0–34.0)
MCHC: 31.8 g/dL (ref 30.0–36.0)
MCV: 80.8 fL (ref 80.0–100.0)
Monocytes Absolute: 0.7 10*3/uL (ref 0.1–1.0)
Monocytes Relative: 8 %
Neutro Abs: 7.5 10*3/uL (ref 1.7–7.7)
Neutrophils Relative %: 77 %
Platelets: 440 10*3/uL — ABNORMAL HIGH (ref 150–400)
RBC: 4.21 MIL/uL (ref 3.87–5.11)
RDW: 13.5 % (ref 11.5–15.5)
WBC: 9.6 10*3/uL (ref 4.0–10.5)
nRBC: 0 % (ref 0.0–0.2)

## 2019-01-24 LAB — COMPREHENSIVE METABOLIC PANEL
ALT: 30 U/L (ref 0–44)
AST: 37 U/L (ref 15–41)
Albumin: 2.7 g/dL — ABNORMAL LOW (ref 3.5–5.0)
Alkaline Phosphatase: 56 U/L (ref 38–126)
Anion gap: 11 (ref 5–15)
BUN: 15 mg/dL (ref 8–23)
CO2: 24 mmol/L (ref 22–32)
Calcium: 8.5 mg/dL — ABNORMAL LOW (ref 8.9–10.3)
Chloride: 101 mmol/L (ref 98–111)
Creatinine, Ser: 0.71 mg/dL (ref 0.44–1.00)
GFR calc Af Amer: >60 mL/min (ref >60)
GFR calc non Af Amer: >60 mL/min (ref >60)
Glucose, Bld: 238 mg/dL — ABNORMAL HIGH (ref 70–99)
Potassium: 3.7 mmol/L (ref 3.5–5.1)
Sodium: 136 mmol/L (ref 135–145)
Total Bilirubin: 0.3 mg/dL (ref 0.3–1.2)
Total Protein: 6.2 g/dL — ABNORMAL LOW (ref 6.5–8.1)

## 2019-01-24 LAB — PROCALCITONIN: Procalcitonin: 33.04 ng/mL

## 2019-01-24 LAB — C-REACTIVE PROTEIN: CRP: 17.1 mg/dL — ABNORMAL HIGH (ref <1.0)

## 2019-01-24 LAB — GLUCOSE, CAPILLARY
Glucose-Capillary: 252 mg/dL — ABNORMAL HIGH (ref 70–99)
Glucose-Capillary: 335 mg/dL — ABNORMAL HIGH (ref 70–99)
Glucose-Capillary: 285 mg/dL — ABNORMAL HIGH (ref 70–99)

## 2019-01-24 LAB — D-DIMER, QUANTITATIVE: D-Dimer, Quant: 2.27 ug/mL-FEU — ABNORMAL HIGH (ref 0.00–0.50)

## 2019-01-24 MED ORDER — FUROSEMIDE 10 MG/ML IJ SOLN
40.0000 mg | Freq: Once | INTRAMUSCULAR | Status: AC
  Administered 2019-01-24: 40 mg via INTRAVENOUS
  Filled 2019-01-24: qty 4

## 2019-01-24 MED ORDER — AZITHROMYCIN 250 MG PO TABS
500.0000 mg | ORAL_TABLET | Freq: Every day | ORAL | Status: AC
  Administered 2019-01-24: 500 mg via ORAL
  Filled 2019-01-24: qty 2

## 2019-01-24 MED ORDER — SODIUM CHLORIDE 0.9 % IV SOLN
2.0000 g | Freq: Three times a day (TID) | INTRAVENOUS | Status: DC
  Administered 2019-01-24 – 2019-01-26 (×7): 2 g via INTRAVENOUS
  Filled 2019-01-24 (×7): qty 2

## 2019-01-24 MED ORDER — ALBUTEROL SULFATE HFA 108 (90 BASE) MCG/ACT IN AERS
2.0000 | INHALATION_SPRAY | Freq: Four times a day (QID) | RESPIRATORY_TRACT | Status: DC | PRN
  Filled 2019-01-24: qty 6.7

## 2019-01-24 MED ORDER — INSULIN ASPART 100 UNIT/ML ~~LOC~~ SOLN
0.0000 [IU] | Freq: Three times a day (TID) | SUBCUTANEOUS | Status: DC
  Administered 2019-01-24: 8 [IU] via SUBCUTANEOUS
  Administered 2019-01-24: 11 [IU] via SUBCUTANEOUS
  Administered 2019-01-25: 5 [IU] via SUBCUTANEOUS
  Administered 2019-01-25: 17:00:00 11 [IU] via SUBCUTANEOUS
  Administered 2019-01-25: 12:00:00 5 [IU] via SUBCUTANEOUS
  Administered 2019-01-26: 18:00:00 3 [IU] via SUBCUTANEOUS
  Administered 2019-01-26 – 2019-01-27 (×3): 5 [IU] via SUBCUTANEOUS
  Administered 2019-01-27: 20 [IU] via SUBCUTANEOUS
  Administered 2019-01-27: 13:00:00 8 [IU] via SUBCUTANEOUS
  Administered 2019-01-28: 17:00:00 15 [IU] via SUBCUTANEOUS
  Administered 2019-01-28: 3 [IU] via SUBCUTANEOUS

## 2019-01-24 MED ORDER — INSULIN GLARGINE 100 UNIT/ML ~~LOC~~ SOLN
15.0000 [IU] | Freq: Every day | SUBCUTANEOUS | Status: DC
  Administered 2019-01-24 – 2019-01-25 (×2): 15 [IU] via SUBCUTANEOUS
  Filled 2019-01-24 (×2): qty 0.15

## 2019-01-24 NOTE — H&P (Signed)
HISTORY AND PHYSICAL       PATIENT DETAILS Name: Amber Aguilar Age: 63 y.o. Sex: female Date of Birth: Apr 27, 1955 Admit Date: 01/23/2019 [id number], Pcp Not In   Patient coming from: [place]:  Transfer from [facility] for management of COVID-19 hypoxic respiratory failure  HPI: Amber Aguilar is a 63 y.o. female with medical history significant of rheumatoid arthritis complicated by ILD on CellCept, Rituxan infusion every 4 months HTN, DM-2, COPD-who presented to [facility] with fever, shortness of breath-she was found to have acute hypoxic respiratory failure requiring high flow oxygen secondary to COVID-19 and admitted to the hospitalist service there.  Per patient and subsequent chart review-she tested positive for COVID-19 on 11/24 (results available in Care Everywhere-testing done at [facility])-she was isolating at home-and was doing relatively well.  She was actually admitted to [facility] overnight on 12/4-observed-and subsequently discharged back home on 12/5.  Upon discharge-she was doing relatively well-we will still isolating at [facility] developed fever, and then started developing  shortness of breath.  EMS was called-patient was found to be saturating in the 70s-she was placed on a nonrebreather mask and taken to [facility] emergency room.  She was admitted to the hospitalist service at [facility]-initially required high flow oxygen-she was treated with steroids and remdesivir, she was subsequently transferred to [facility] under my service on 12/14.   Note: Lives at: Home Mobility:  Independent Chronic Indwelling Foley:yes  REVIEW OF SYSTEMS:  Constitutional:   No  weight loss, night sweats,  Fevers, chills, fatigue.  HEENT:    No headaches, Dysphagia,Tooth/dental problems,Sore throat,  No sneezing, itching, ear ache, nasal congestion, post nasal drip  Cardio-vascular: No chest pain,Orthopnea, PND,lower extremity edema, anasarca, palpitations  GI:    No heartburn, indigestion, abdominal pain, nausea, vomiting, diarrhea, melena or hematochezia  Resp: No shortness of breath, cough, hemoptysis,plueritic chest pain.   Skin:  No rash or lesions.  GU:  No dysuria, change in color of urine, no urgency or frequency.  No flank pain.  Musculoskeletal: No joint pain or swelling.  No decreased range of motion.  No back pain.  Endocrine: No heat intolerance, no cold intolerance, no polyuria, no polydipsia  Psych: No change in mood or affect. No depression or anxiety.  No memory loss.   ALLERGIES:   Allergies  Allergen Reactions  . Benzalkonium Chloride Other (See Comments)  . Neosporin [Neomycin-Bacitracin Zn-Polymyx]     PAST MEDICAL HISTORY: Past Medical History:  Diagnosis Date  . Arthritis    rheumatoid  . CHF (congestive heart failure) ([place])   . COPD (chronic obstructive pulmonary disease) ([place])   . Diabetes mellitus without complication ([place])     PAST SURGICAL HISTORY: Past Surgical History:  Procedure Laterality Date  . CARPAL TUNNEL RELEASE      MEDICATIONS AT HOME: Prior to Admission medications   Medication Sig Start Date End Date Taking? Authorizing Provider  acetaminophen (TYLENOL) 500 MG tablet Take 1 tablet (500 mg total) by mouth every 6 (six) hours as needed. 04/24/18   Laban Emperor, PA-C  albuterol (PROVENTIL) (2.5 MG/3ML) 0.083% nebulizer solution Take 2.5 mg by nebulization every 6 (six) hours as needed for wheezing or shortness of breath.    [provider]  atorvastatin (LIPITOR) 10 MG tablet Take 10 mg by mouth daily.    [provider]  azithromycin 500 mg in sodium chloride 0.9 % 250 mL Inject 500 mg into the vein daily.  01/23/19   Lorella Nimrod, MD  ceFEPIme 2 g in sodium chloride 0.9 % 100 mL Inject 2 g into the vein every 8 (eight) hours. 01/23/19   Lorella Nimrod, MD  chlorpheniramine-HYDROcodone (TUSSIONEX PENNKINETIC ER) 10-8 MG/5ML SUER Take 5 mLs by mouth 2 (two) times  daily. 01/18/19   Earleen [place], MD  fluticasone (FLONASE) 50 MCG/ACT nasal spray Place 1 spray into both nostrils daily.     [provider]  heparin 25000-0.45 UT/250ML-% infusion Inject 1,000 Units/hr into the vein continuous. 01/23/19   Lorella Nimrod, MD  hydrochlorothiazide (HYDRODIURIL) 25 MG tablet Take 25 mg by mouth daily.    [provider]  hydrOXYzine (ATARAX/VISTARIL) 25 MG tablet Take 25 mg by mouth every 6 (six) hours as needed for anxiety or itching.  11/16/18   [provider]  methylPREDNISolone sodium succinate (SOLU-MEDROL) 40 mg/mL injection Inject 1 mL (40 mg total) into the vein every 12 (twelve) hours. 01/23/19   Lorella Nimrod, MD  Multiple Vitamin (MULTI-VITAMIN) tablet Take 1 tablet by mouth daily.     [provider]  omeprazole (PRILOSEC) 20 MG capsule Take 20 mg by mouth daily.    [provider]  ondansetron (ZOFRAN ODT) 4 MG disintegrating tablet Take 1 tablet (4 mg total) by mouth every 8 (eight) hours as needed for nausea or vomiting. 01/18/19   Earleen Newport, MD  vancomycin 1,250 mg in sodium chloride 0.9 % 250 mL Inject 1,250 mg into the vein daily. 01/23/19   Lorella Nimrod, MD  vitamin B-12 (CYANOCOBALAMIN) 1000 MCG tablet Take 1,000 mcg by mouth daily.     [provider]  vitamin C (VITAMIN C) 500 MG tablet Take 1 tablet (500 mg total) by mouth daily. 01/24/19   Lorella Nimrod, MD  zinc sulfate 220 (50 Zn) MG capsule Take 1 capsule (220 mg total) by mouth daily. 01/24/19   Lorella Nimrod, MD    FAMILY HISTORY: History reviewed. No pertinent family history.  SOCIAL HISTORY:  reports that she has never smoked. She has never used smokeless tobacco. She reports that she does not drink alcohol or use drugs.  PHYSICAL EXAM: Blood pressure (!) 109/54, pulse 65, temperature 98 F (36.7 C), resp. rate 18, height 5\' 2"  (1.575 m), weight 87.9 kg, SpO2 97 %.  General appearance :Awake, alert, not in any  distress.  Eyes:, pupils equally reactive to light and accomodation,no scleral icterus.Pink conjunctiva HEENT: Atraumatic and Normocephalic Neck: supple, no JVD.  Resp:Good air entry bilaterally, no added sounds  CVS: S1 S2 regular, no murmurs.  GI: Bowel sounds present, Non tender and not distended with no gaurding, rigidity or rebound. Extremities: B/L Lower Ext shows no edema, both legs are warm to touch Neurology:  speech clear,Non focal, sensation is grossly intact. Psychiatric: Normal judgment and insight. Alert and oriented x 3. Normal mood. Musculoskeletal:gait appears to be normal.No digital cyanosis Skin:No Rash, warm and dry Wounds:N/A  LABS ON ADMISSION:  I have personally reviewed following labs and imaging studies  CBC: Recent Labs  Lab 01/18/19 1721 01/22/19 1502 01/23/19 0656 01/24/19 0530  WBC 11.8* 16.6* 10.0 9.6  NEUTROABS  --  13.4*  --  7.5  HGB 13.5 12.2 11.5* 10.8*  HCT 41.2 36.4 35.2* 34.0*  MCV 78.8* 77.8* 79.3* 80.8  PLT 533* 448* 385 440*    Basic Metabolic Panel: Recent Labs  Lab 01/18/19 1721 01/22/19 1502 01/22/19 1705 01/23/19 0656 01/24/19 0530  NA 136 134*  --  139 136  K 3.1* 2.9*  --  3.5 3.7  CL 97* 95*  --  103 101  CO2 27 25  --  26 24  GLUCOSE 195* 143*  --  165* 238*  BUN 18 19  --  15 15  CREATININE 1.07* 1.09*  --  0.64 0.71  CALCIUM 9.2 8.4*  --  8.0* 8.5*  MG  --   --  2.3  --   --     GFR: Estimated Creatinine Clearance: 76 mL/min (by C-G formula based on SCr of 0.71 mg/dL).  Liver Function Tests: Recent Labs  Lab 01/22/19 1502 01/24/19 0530  AST 49* 37  ALT 32 30  ALKPHOS 52 56  BILITOT 0.7 0.3  PROT 7.0 6.2*  ALBUMIN 3.3* 2.7*   No results for input(s): LIPASE, AMYLASE in the last 168 hours. No results for input(s): AMMONIA in the last 168 hours.  Coagulation Profile: Recent Labs  Lab 01/22/19 1705  INR 1.1    Cardiac Enzymes: No results for input(s): CKTOTAL, CKMB, CKMBINDEX, TROPONINI in the  last 168 hours.  BNP (last 3 results) No results for input(s): PROBNP in the last 8760 hours.  HbA1C: No results for input(s): HGBA1C in the last 72 hours.  CBG: Recent Labs  Lab 01/22/19 2224  GLUCAP 300*    Lipid Profile: Recent Labs    01/22/19 1502  TRIG 114    Thyroid Function Tests: No results for input(s): TSH, T4TOTAL, FREET4, T3FREE, THYROIDAB in the last 72 hours.  Anemia Panel: Recent Labs    01/22/19 1502 01/24/19 0530  FERRITIN 796* 874*    Urine analysis:    Component Value Date/Time   COLORURINE YELLOW (A) 01/22/2019 1558   APPEARANCEUR CLEAR (A) 01/22/2019 1558   APPEARANCEUR Clear 10/29/2013 1941   LABSPEC 1.015 01/22/2019 1558   LABSPEC 1.018 10/29/2013 1941   PHURINE 6.0 01/22/2019 1558   GLUCOSEU NEGATIVE 01/22/2019 1558   GLUCOSEU Negative 10/29/2013 1941   HGBUR NEGATIVE 01/22/2019 1558   BILIRUBINUR NEGATIVE 01/22/2019 1558   BILIRUBINUR Negative 10/29/2013 1941   KETONESUR 5 (A) 01/22/2019 1558   PROTEINUR 100 (A) 01/22/2019 1558   NITRITE NEGATIVE 01/22/2019 1558   LEUKOCYTESUR NEGATIVE 01/22/2019 1558   LEUKOCYTESUR Negative 10/29/2013 1941    Sepsis Labs: Lactic Acid, Venous    Component Value Date/Time   LATICACIDVEN 1.4 01/22/2019 1705     Microbiology: Recent Results (from the past 240 hour(s))  Blood Culture (routine x 2)     Status: None (Preliminary result)   Collection Time: 01/22/19  3:01 PM   Specimen: BLOOD  Result Value Ref Range Status   Specimen Description BLOOD RIGHT ANTECUBITAL  Final   Special Requests   Final    BOTTLES DRAWN AEROBIC AND ANAEROBIC Blood Culture adequate volume   Culture   Final    NO GROWTH 2 DAYS Performed at [facility], [address] [place], [place] [address]    Report Status PENDING  Incomplete  Blood Culture (routine x 2)     Status: None (Preliminary result)   Collection Time: 01/22/19  3:07 PM   Specimen: BLOOD  Result Value Ref Range Status   Specimen  Description BLOOD LEFT ANTECUBITAL  Final   Special Requests   Final    BOTTLES DRAWN AEROBIC AND ANAEROBIC Blood Culture adequate volume   Culture   Final    NO GROWTH 2 DAYS Performed at [facility], [address], [place], [place] [address]    Report  Status PENDING  Incomplete  MRSA PCR Screening     Status: None   Collection Time: 01/22/19 11:44 PM   Specimen: Nasal Mucosa; Nasopharyngeal  Result Value Ref Range Status   MRSA by PCR NEGATIVE NEGATIVE Final    Comment:        The GeneXpert MRSA Assay (FDA approved for NASAL specimens only), is one component of a comprehensive MRSA colonization surveillance program. It is not intended to diagnose MRSA infection nor to guide or monitor treatment for MRSA infections. Performed at [facility], [address] [place], [place] [address]       RADIOLOGIC STUDIES ON ADMISSION: DG Chest [facility] 1 View  Result Date: 01/22/2019 CLINICAL DATA:  Increasing SOB that worsened today. Per ER notes: Per EMS pt had O2 sats of 70 when they arrived at her home and she had SOB and recent positive covid test.Hx of CHF, COPD, DM EXAM: PORTABLE CHEST 1 VIEW COMPARISON:  01/18/2019 and earlier exams. FINDINGS: Bilateral airspace lung opacities, which predominate in the lower lungs, have increased from prior exam. No convincing pleural effusion.  No pneumothorax. Cardiac silhouette is normal in size. IMPRESSION: 1. Interval worsening of lung aeration with increased bilateral airspace lung opacities. Findings consistent with worsened COVID-19 pneumonia. Electronically Signed   By: Lajean Manes M.D.   On: 01/22/2019 15:31    I have personally reviewed images of chest xray -bilateral infiltrates extensively all over.  EKG:  Personally reviewed.  Sinus tachycardia.  ASSESSMENT AND PLAN: . Acute hypoxemic respiratory failure due to severe acute respiratory syndrome coronavirus 2 (SARS-CoV-2) disease and concurrent bacterial  pneumonia: Oxygen requirement seems to have stabilized-and is slowly improving.  Down to 5 L of high flow oxygen.  Plans are to continue steroids and remdesivir.  Procalcitonin significantly elevated-we will resume cefepime-suspect does not need vancomycin as MRSA PCR negative and blood cultures are negative.  Given clinical improvement-suspect best to avoid Actemra (elevated procalcitonin-also on CellCept/Rituxan as outpatient).  COVID-19 Labs  Recent Labs    01/22/19 1502 01/24/19 0530  DDIMER  --  2.27*  FERRITIN 796* 874*  LDH 681*  --   CRP 12.8* 17.1*    Lab Results  Component Value Date   SARSCOV2NAA POSITIVE (A) 01/14/2019    COVID-19 medications: Remdesivir: 12/12>> Steroids: 12/12  Other medications: Lasix: 40 mg IV x1 to maintain negative balance. Vancomycin: 12/12>> 12/13 Zithromax: 12/12>> 12/14 Cefepime: 12/12>>   DM-2 (A1c 8.1 on 12/4): CBGs will be uncontrolled secondary to steroids-start 15 units of Lantus daily, continue SSI.  Follow and adjust.  CBG (last 3)  Recent Labs    01/22/19 2224  GLUCAP 300*   COPD: No wheezing-continue with as needed bronchodilators.  Chronic diastolic heart failure: Compensated will give Lasix 40 mg IV x1 to maintain negative balance.  Dyslipidemia: Continue statin  GERD: Continue PPI  RA complicated by ILD: Supportive care-on steroids.  CellCept on hold-on Rituxan infusion every 4 months.  Further plan will depend as patient's clinical course evolves and further radiologic and laboratory data become available. Patient will be monitored closely.  Above noted plan was discussed with patient face to face at bedside, she was in agreement.   CONSULTS: None  DVT Prophylaxis: Prophylactic Lovenox   Code Status: Full Code  Disposition Plan:  Discharge back home possibly in 3-4 days, depending on clinical course  Admission status:  Inpatient  going to tele   The medical decision making on this patient was of high  complexity  and the patient is at high risk for clinical deterioration, therefore this is a level 3 visit.    Total time spent  55 minutes.Greater than 50% of this time was spent in counseling, explanation of diagnosis, planning of further management, and coordination of care.  Severity of illness: The appropriate patient status for this patient is INPATIENT. Inpatient status is judged to be reasonable and necessary in order to provide the required intensity of service to ensure the patient's safety. The patient's presenting symptoms, physical exam findings, and initial radiographic and laboratory data in the context of their chronic comorbidities is felt to place them at high risk for further clinical deterioration. Furthermore, it is not anticipated that the patient will be medically stable for discharge from the hospital within 2 midnights of admission. The following factors support the patient status of inpatient.   " The patient's presenting symptoms include fever, cough, shortness of breath " The worrisome physical exam findings include hypoxia " The initial radiographic and laboratory data are worrisome because of chest x-ray positive for pneumonia " The chronic co-morbidities include RA on immunosuppressive's   * I certify that at the point of admission it is my clinical judgment that the patient will require inpatient hospital care spanning beyond 2 midnights from the point of admission due to high intensity of service, high risk for further deterioration and high frequency of surveillance required.**  Veleria Barnhardt [facility] Pager [phone]  If 7PM-7AM, please contact night-coverage  Please page via [email or web address]  Go to [email or web address] and use [place]'s universal password to access. If you do not have the password, please contact the hospital operator.  Locate the [facility] provider you are looking for under [facility] and page to a number that you can be directly  reached. If you still have difficulty reaching the provider, please page the [facility] (Director on Call) for the Hospitalists listed on amion for assistance.  01/24/2019, 10:39 AM

## 2019-01-24 NOTE — Evaluation (Signed)
Occupational Therapy Evaluation Patient Details Name: Amber Aguilar MRN: [id number] DOB: [birth year] Today's Date: 01/24/2019    History of Present Illness 63 y.o. 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 Pt currently performing ADLs and functional mobility at Supervision level. Pt presenting with poor activity tolerance as seen by fatigue, decreased SpO2 on supplemental O2, and requiring increased time. Pt SpO2 dropping to 80s with 4L O2 during activity; cued for purse lip breathing and pt able to elevate back to 90s. Pt would benefit from further acute OT to facilitate safe dc. Recommend dc to home once medically stable per physician.      Follow Up Recommendations  No OT follow up;Supervision/Assistance - 24 hour    Equipment Recommendations  Tub/shower seat    Recommendations for Other Services PT consult     Precautions / Restrictions Precautions Precautions: Other (comment)(Watch O2)      Mobility Bed Mobility               General bed mobility comments: In recliner upon arrival  Transfers Overall transfer level: Needs assistance   Transfers: Sit to/from Stand Sit to Stand: Supervision         General transfer comment: Supervision for safety    Balance Overall balance assessment: No apparent balance deficits (not formally assessed)                                         ADL either performed or assessed with clinical judgement   ADL Overall ADL's : Needs assistance/impaired Eating/Feeding: Independent;Sitting   Grooming: Wash/dry face;Wash/dry hands;Oral  care Grooming Details (indicate cue type and reason): Supervision for safety. SpO2 dropping to 80s and cued for purse lip breathing; pt able to elevate quickly back to 90s on 4L O2. Upper Body Bathing: Supervision/ safety;Sitting   Lower Body Bathing: Supervison/ safety;Sit to/from stand   Upper Body Dressing : Supervision/safety;Sitting   Lower Body Dressing: Supervision/safety;Sit to/from stand   Toilet Transfer: Supervision/safety;Ambulation;Regular Toilet           Functional mobility during ADLs: Supervision/safety General ADL Comments: Supervision for safety. Moves very slow and cautiously.      Vision Baseline Vision/History: Wears glasses       Perception     Praxis      Pertinent Vitals/Pain Pain Assessment: No/denies pain     Hand Dominance Right   Extremity/Trunk Assessment Upper Extremity Assessment Upper Extremity Assessment: Overall WFL for tasks assessed   Lower Extremity Assessment Lower Extremity Assessment: Defer to PT evaluation   Cervical / Trunk Assessment Cervical / Trunk Assessment: Other exceptions Cervical / Trunk Exceptions: Increased bosy habtisu   Communication Communication Communication: No difficulties   Cognition Arousal/Alertness: Awake/alert Behavior During Therapy: WFL for tasks assessed/performed Overall Cognitive Status: Within Functional Limits for tasks assessed                                 General Comments: Motivated to participate in therapy   General Comments  SpO2 dropping to 80s with activity on 4L O2. Pt able to perform purse lip breathing and return to 90s on 4L. O2 monitor on ear lobe    Exercises     Shoulder Instructions      Home Living Family/patient expects to be discharged to:: Private residence Living Arrangements: Spouse/significant other;Children(husband, daughter, and three grandchildren) Available Help at Discharge: Family;Available 24 hours/day Type of Home: House Home  Access: Stairs to enter [employer] of Steps: 6 Entrance Stairs-Rails: None Home Layout: One level     Bathroom Shower/Tub: Occupational psychologist: Standard     Home Equipment: None          Prior Functioning/Environment Level of Independence: Independent        Comments: 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        OT Problem List: Decreased strength;Decreased range of motion;Decreased activity tolerance;Impaired balance (sitting and/or standing);Decreased knowledge of use of DME or AE;Decreased knowledge of precautions      OT Treatment/Interventions: Self-care/ADL training;Therapeutic exercise;Energy conservation;DME and/or AE instruction;Therapeutic activities;Patient/family education    OT Goals(Current goals can be found in the care plan section) Acute Rehab OT Goals Patient Stated Goal: "Go home" OT Goal Formulation: With patient Time For Goal Achievement: 02/07/19 Potential to Achieve Goals: Good  OT Frequency: Min 2X/week   Barriers to D/C:            Co-evaluation              AM-PAC OT "6 Clicks" Daily Activity     Outcome Measure Help from another person eating meals?: None Help from another person taking care of personal grooming?: A Little Help from another person toileting, which includes using toliet, bedpan, or urinal?: A Little Help from another person bathing (including washing, rinsing, drying)?: A Little Help from another person to put on and taking off regular upper body clothing?: None Help from another person to put on and taking off regular lower body clothing?: A Little 6 Click Score: 20   End of Session Equipment Utilized During Treatment: Oxygen Nurse Communication: Mobility status  Activity Tolerance: Patient tolerated treatment well Patient left: in chair;with call bell/phone within reach  OT Visit Diagnosis: Unsteadiness on feet (R26.81);Other abnormalities of gait and mobility  (R26.89);Muscle weakness (generalized) (M62.81)                Time: [id number] OT Time Calculation (min): 24 min Charges:  OT General Charges $OT Visit: 1 Visit OT Evaluation $OT Eval Low Complexity: 1 Low OT Treatments $Self Care/Home Management : 8-22 mins  Daric Koren MSOT, OTR/L Acute Rehab Pager: [phone] Office: [place] 01/24/2019, 2:16 PM

## 2019-01-24 NOTE — Discharge Summary (Signed)
Physician Discharge Summary  Amber Aguilar [id number] DOB: [birth year] DOA: 01/22/2019  PCP: System, Pcp Not In  Admit date: 01/22/2019 Discharge date: 01/24/2019  Admitted From: Home Disposition:  [facility]  Recommendations for Outpatient Follow-up:  1. Follow up with PCP in 1-2 weeks 2. Please obtain BMP/CBC in one week 3. Please follow up on the following pending results:  Home Health: No Equipment/Devices: Discharge Condition:  CODE STATUS: Full Diet recommendation: Heart Healthy / Carb Modified   Brief/Interim Summary: Amber Aguilar an 63 y.o.femalewith medical history significant for rheumatoid arthritis, CHF, COPD, diabetes mellitus, hypertension, morbid obesity, who presented to the hospital because of increasing shortness of breath. She tested positive coronavirus infection on 01/14/2019. She said that she had been managing at home but she has been experiencing cough, pleuritic chest pain and shortness of breath.  Patient was febrile and hypoxic on arrival.  Found to be COVID-19 positive with elevated inflammatory markers.  Requiring up to 50 L with high flow. Patient was transferred to [facility] for better management of her COVID-19 infection requiring a lot more oxygen.  Discharge Diagnoses:  Principal Problem:   Pneumonia due to COVID-19 virus Active Problems:   Acute hypoxemic respiratory failure [facility])   Discharge Instructions  Discharge Instructions    Diet - low sodium heart healthy   Complete by: As directed    Increase activity slowly   Complete by: As directed      Allergies as of 01/23/2019      Reactions   Benzalkonium Chloride Other (See Comments)   Neosporin [neomycin-bacitracin Zn-polymyx]       Medication List    STOP taking these medications   metFORMIN 1000 MG tablet Commonly known as: GLUCOPHAGE   mycophenolate 500 MG tablet Commonly known as: CELLCEPT     TAKE these medications   acetaminophen 500 MG tablet Commonly known as:  TYLENOL Take 1 tablet (500 mg total) by mouth every 6 (six) hours as needed.   albuterol (2.5 MG/3ML) 0.083% nebulizer solution Commonly known as: PROVENTIL Take 2.5 mg by nebulization every 6 (six) hours as needed for wheezing or shortness of breath.   ascorbic acid 500 MG tablet Commonly known as: VITAMIN C Take 1 tablet (500 mg total) by mouth daily.   atorvastatin 10 MG tablet Commonly known as: LIPITOR Take 10 mg by mouth daily.   azithromycin 500 mg in sodium chloride 0.9 % 250 mL Inject 500 mg into the vein daily.   ceFEPIme 2 g in sodium chloride 0.9 % 100 mL Inject 2 g into the vein every 8 (eight) hours.   chlorpheniramine-HYDROcodone 10-8 MG/5ML Suer Commonly known as: Tussionex Pennkinetic ER Take 5 mLs by mouth 2 (two) times daily.   fluticasone 50 MCG/ACT nasal spray Commonly known as: FLONASE Place 1 spray into both nostrils daily.   heparin 25000-0.45 UT/250ML-% infusion Inject 1,000 Units/hr into the vein continuous.   hydrochlorothiazide 25 MG tablet Commonly known as: HYDRODIURIL Take 25 mg by mouth daily.   hydrOXYzine 25 MG tablet Commonly known as: ATARAX/VISTARIL Take 25 mg by mouth every 6 (six) hours as needed for anxiety or itching.   methylPREDNISolone sodium succinate 40 mg/mL injection Commonly known as: SOLU-MEDROL Inject 1 mL (40 mg total) into the vein every 12 (twelve) hours.   Multi-Vitamin tablet Take 1 tablet by mouth daily.   omeprazole 20 MG capsule Commonly known as: PRILOSEC Take 20 mg by mouth daily.   ondansetron 4 MG disintegrating tablet Commonly known as: Zofran ODT  Take 1 tablet (4 mg total) by mouth every 8 (eight) hours as needed for nausea or vomiting.   vancomycin 1,250 mg in sodium chloride 0.9 % 250 mL Inject 1,250 mg into the vein daily.   vitamin B-12 1000 MCG tablet Commonly known as: CYANOCOBALAMIN Take 1,000 mcg by mouth daily.   zinc sulfate 220 (50 Zn) MG capsule Take 1 capsule (220 mg total) by  mouth daily.       Allergies  Allergen Reactions  . Benzalkonium Chloride Other (See Comments)  . Neosporin [Neomycin-Bacitracin Zn-Polymyx]     Consultations:    Procedures/Studies: DG Chest 1 View  Result Date: 01/14/2019 CLINICAL DATA:  Shortness of breath EXAM: CHEST  1 VIEW COMPARISON:  04/24/2010 FINDINGS: Heart is borderline in size. Mild vascular congestion. Bibasilar opacities, likely atelectasis. Small effusions. No acute bony abnormality. IMPRESSION: Borderline heart size, vascular congestion. Bibasilar atelectasis with small effusions. Electronically Signed   By: Rolm Baptise M.D.   On: 01/14/2019 01:17   DG Chest 2 View  Result Date: 01/18/2019 CLINICAL DATA:  Chest pain. COVID-19 positive patient. Shortness of breath. EXAM: CHEST - 2 VIEW COMPARISON:  January 14, 2019 FINDINGS: Bibasilar infiltrates remain. The heart, hila, mediastinum, lungs, and pleura are otherwise unchanged and unremarkable. Chronic compression fracture in the upper lumbar spine. IMPRESSION: Persistent bibasilar infiltrates. Recommend follow-up to resolution. Persistent bibasilar infiltrates, likely due to the patient's COVID-19 status. No other acute abnormalities are seen. Electronically Signed   By: Dorise Bullion III M.D   On: 01/18/2019 16:11   CT ANGIO CHEST PE W OR WO CONTRAST  Result Date: 01/14/2019 CLINICAL DATA:  Progressive dyspnea and cough. Fever. Hypoxia. Positive COVID-19 test 10 days ago. EXAM: CT ANGIOGRAPHY CHEST WITH CONTRAST TECHNIQUE: Multidetector CT imaging of the chest was performed using the standard protocol during bolus administration of intravenous contrast. Multiplanar CT image reconstructions and MIPs were obtained to evaluate the vascular anatomy. CONTRAST:  27mL OMNIPAQUE IOHEXOL 350 MG/ML SOLN COMPARISON:  One-view chest x-ray 01/14/2019 FINDINGS: Cardiovascular: The heart is mildly enlarged. Aorta and great vessel origins are within normal limits. Pulmonary artery  opacification is excellent. No focal filling defects are present to suggest pulmonary embolus. Mediastinum/Nodes: No significant mediastinal, hilar, or axillary adenopathy is present. Lungs/Pleura: Patchy peripheral airspace opacities are present, predominantly in the lower lobes. Finding is consistent with COVID-19 pneumonia. Broad areas are normal without evidence for edema. No significant pleural effusion is present. Upper Abdomen: Diffuse fatty infiltration of the liver is noted. No focal lesions are present. Upper abdomen is otherwise within normal limits. Musculoskeletal: No chest wall abnormality. No acute or significant osseous findings. Review of the MIP images confirms the above findings. IMPRESSION: 1. No evidence for pulmonary embolus. 2. Patchy peripheral airspace opacities bilaterally compatible with COVID-19 pneumonia. 3. Cardiomegaly without failure. 4. Hepatic steatosis Electronically Signed   By: San Morelle M.D.   On: 01/14/2019 05:28   DG Chest Port 1 View  Result Date: 01/22/2019 CLINICAL DATA:  Increasing SOB that worsened today. Per ER notes: Per EMS pt had O2 sats of 70 when they arrived at her home and she had SOB and recent positive covid test.Hx of CHF, COPD, DM EXAM: PORTABLE CHEST 1 VIEW COMPARISON:  01/18/2019 and earlier exams. FINDINGS: Bilateral airspace lung opacities, which predominate in the lower lungs, have increased from prior exam. No convincing pleural effusion.  No pneumothorax. Cardiac silhouette is normal in size. IMPRESSION: 1. Interval worsening of lung aeration with increased bilateral airspace lung  opacities. Findings consistent with worsened COVID-19 pneumonia. Electronically Signed   By: Lajean Manes M.D.   On: 01/22/2019 15:31    Subjective: Patient continued to experience shortness of breath, stating it is little better than before.  Still on 50 L.  Discharge Exam: Vitals:   01/23/19 2000 01/23/19 2015  BP: (!) 115/45   Pulse: 73   Resp:  (!) 27   Temp:  98.8 F (37.1 C)  SpO2: 94%    Vitals:   01/23/19 1800 01/23/19 1819 01/23/19 2000 01/23/19 2015  BP: (!) 109/58 (!) 109/58 (!) 115/45   Pulse: 82 79 73   Resp: (!) 28 (!) 26 (!) 27   Temp:  99 F (37.2 C)  98.8 F (37.1 C)  TempSrc:  Oral  Oral  SpO2: 97% 92% 94%   Weight:      Height:        General: Pt is alert, awake, not in acute distress Cardiovascular: RRR, S1/S2 +, no rubs, no gallops Respiratory: Bilateral scattered crackles, no wheezing, no rhonchi Abdominal: Soft, NT, ND, bowel sounds + Extremities: no edema, no cyanosis   The results of significant diagnostics from this hospitalization (including imaging, microbiology, ancillary and laboratory) are listed below for reference.    Microbiology: Recent Results (from the past 240 hour(s))  Blood Culture (routine x 2)     Status: None (Preliminary result)   Collection Time: 01/22/19  3:01 PM   Specimen: BLOOD  Result Value Ref Range Status   Specimen Description BLOOD RIGHT ANTECUBITAL  Final   Special Requests   Final    BOTTLES DRAWN AEROBIC AND ANAEROBIC Blood Culture adequate volume   Culture   Final    NO GROWTH < 24 HOURS Performed at [facility], [address], [place], [place] [address]    Report Status PENDING  Incomplete  Blood Culture (routine x 2)     Status: None (Preliminary result)   Collection Time: 01/22/19  3:07 PM   Specimen: BLOOD  Result Value Ref Range Status   Specimen Description BLOOD LEFT ANTECUBITAL  Final   Special Requests   Final    BOTTLES DRAWN AEROBIC AND ANAEROBIC Blood Culture adequate volume   Culture   Final    NO GROWTH < 24 HOURS Performed at [facility], [address] [place], [place] [address]    Report Status PENDING  Incomplete  MRSA PCR Screening     Status: None   Collection Time: 01/22/19 11:44 PM   Specimen: Nasal Mucosa; Nasopharyngeal  Result Value Ref Range Status   MRSA by PCR NEGATIVE NEGATIVE Final     Comment:        The GeneXpert MRSA Assay (FDA approved for NASAL specimens only), is one component of a comprehensive MRSA colonization surveillance program. It is not intended to diagnose MRSA infection nor to guide or monitor treatment for MRSA infections. Performed at [facility], [address] [place],  [address]      Labs: BNP (last 3 results) Recent Labs    01/22/19 1502  BNP [id number]   Basic Metabolic Panel: Recent Labs  Lab 01/18/19 1721 01/22/19 1502 01/22/19 1705 01/23/19 0656  NA 136 134*  --  139  K 3.1* 2.9*  --  3.5  CL 97* 95*  --  103  CO2 27 25  --  26  GLUCOSE 195* 143*  --  165*  BUN 18 19  --  15  CREATININE 1.07* 1.09*  --  0.64  CALCIUM 9.2 8.4*  --  8.0*  MG  --   --  2.3  --    Liver Function Tests: Recent Labs  Lab 01/22/19 1502  AST 49*  ALT 32  ALKPHOS 52  BILITOT 0.7  PROT 7.0  ALBUMIN 3.3*   No results for input(s): LIPASE, AMYLASE in the last 168 hours. No results for input(s): AMMONIA in the last 168 hours. CBC: Recent Labs  Lab 01/18/19 1721 01/22/19 1502 01/23/19 0656  WBC 11.8* 16.6* 10.0  NEUTROABS  --  13.4*  --   HGB 13.5 12.2 11.5*  HCT 41.2 36.4 35.2*  MCV 78.8* 77.8* 79.3*  PLT 533* 448* 385   Cardiac Enzymes: No results for input(s): CKTOTAL, CKMB, CKMBINDEX, TROPONINI in the last 168 hours. BNP: Invalid input(s): POCBNP CBG: Recent Labs  Lab 01/22/19 2224  GLUCAP 300*   D-Dimer No results for input(s): DDIMER in the last 72 hours. Hgb A1c No results for input(s): HGBA1C in the last 72 hours. Lipid Profile Recent Labs    01/22/19 1502  TRIG 114   Thyroid function studies No results for input(s): TSH, T4TOTAL, T3FREE, THYROIDAB in the last 72 hours.  Invalid input(s): FREET3 Anemia work up Recent Labs    01/22/19 1502  FERRITIN 796*   Urinalysis    Component Value Date/Time   COLORURINE YELLOW (A) 01/22/2019 1558   APPEARANCEUR CLEAR (A) 01/22/2019 1558    APPEARANCEUR Clear 10/29/2013 1941   LABSPEC 1.015 01/22/2019 1558   LABSPEC 1.018 10/29/2013 1941   PHURINE 6.0 01/22/2019 1558   GLUCOSEU NEGATIVE 01/22/2019 1558   GLUCOSEU Negative 10/29/2013 1941   HGBUR NEGATIVE 01/22/2019 1558   BILIRUBINUR NEGATIVE 01/22/2019 1558   BILIRUBINUR Negative 10/29/2013 1941   KETONESUR 5 (A) 01/22/2019 1558   PROTEINUR 100 (A) 01/22/2019 1558   NITRITE NEGATIVE 01/22/2019 1558   LEUKOCYTESUR NEGATIVE 01/22/2019 1558   LEUKOCYTESUR Negative 10/29/2013 1941   Sepsis Labs Invalid input(s): PROCALCITONIN,  WBC,  LACTICIDVEN Microbiology Recent Results (from the past 240 hour(s))  Blood Culture (routine x 2)     Status: None (Preliminary result)   Collection Time: 01/22/19  3:01 PM   Specimen: BLOOD  Result Value Ref Range Status   Specimen Description BLOOD RIGHT ANTECUBITAL  Final   Special Requests   Final    BOTTLES DRAWN AEROBIC AND ANAEROBIC Blood Culture adequate volume   Culture   Final    NO GROWTH < 24 HOURS Performed at [facility], [address] [place], [place] [address]    Report Status PENDING  Incomplete  Blood Culture (routine x 2)     Status: None (Preliminary result)   Collection Time: 01/22/19  3:07 PM   Specimen: BLOOD  Result Value Ref Range Status   Specimen Description BLOOD LEFT ANTECUBITAL  Final   Special Requests   Final    BOTTLES DRAWN AEROBIC AND ANAEROBIC Blood Culture adequate volume   Culture   Final    NO GROWTH < 24 HOURS Performed at [facility], [address] [place], [place] [address]    Report Status PENDING  Incomplete  MRSA PCR Screening     Status: None   Collection Time: 01/22/19 11:44 PM   Specimen: Nasal Mucosa; Nasopharyngeal  Result Value Ref Range Status   MRSA by PCR NEGATIVE NEGATIVE Final    Comment:        The GeneXpert MRSA Assay (FDA approved for NASAL specimens only), is one component of  a comprehensive MRSA colonization surveillance program. It is  not intended to diagnose MRSA infection nor to guide or monitor treatment for MRSA infections. Performed at [facility], [address] [place], [place] [address]     Time coordinating discharge: Over 30 minutes  SIGNED:  Lorella Nimrod, MD  [facility] 01/24/2019, 7:30 AM Pager [phone]  If 7PM-7AM, please contact night-coverage [email or web address] Password [email or web address]  This record has been created using Systems analyst. Errors have been sought and corrected,but may not always be located. Such creation errors do not reflect on the standard of care.

## 2019-01-25 DIAGNOSIS — J1289 Other viral pneumonia: Secondary | ICD-10-CM

## 2019-01-25 DIAGNOSIS — J449 Chronic obstructive pulmonary disease, unspecified: Secondary | ICD-10-CM

## 2019-01-25 LAB — COMPREHENSIVE METABOLIC PANEL
ALT: 34 U/L (ref 0–44)
AST: 27 U/L (ref 15–41)
Albumin: 3 g/dL — ABNORMAL LOW (ref 3.5–5.0)
Alkaline Phosphatase: 56 U/L (ref 38–126)
Anion gap: 10 (ref 5–15)
BUN: 21 mg/dL (ref 8–23)
CO2: 26 mmol/L (ref 22–32)
Calcium: 8.6 mg/dL — ABNORMAL LOW (ref 8.9–10.3)
Chloride: 99 mmol/L (ref 98–111)
Creatinine, Ser: 0.74 mg/dL (ref 0.44–1.00)
GFR calc Af Amer: >60 mL/min (ref >60)
GFR calc non Af Amer: >60 mL/min (ref >60)
Glucose, Bld: 253 mg/dL — ABNORMAL HIGH (ref 70–99)
Potassium: 3.6 mmol/L (ref 3.5–5.1)
Sodium: 135 mmol/L (ref 135–145)
Total Bilirubin: 0.3 mg/dL (ref 0.3–1.2)
Total Protein: 6.6 g/dL (ref 6.5–8.1)

## 2019-01-25 LAB — CBC WITH DIFFERENTIAL/PLATELET
Abs Immature Granulocytes: 0.07 10*3/uL (ref 0.00–0.07)
Basophils Absolute: 0 10*3/uL (ref 0.0–0.1)
Basophils Relative: 0 %
Eosinophils Absolute: 0 10*3/uL (ref 0.0–0.5)
Eosinophils Relative: 0 %
HCT: 36.4 % (ref 36.0–46.0)
Hemoglobin: 11.6 g/dL — ABNORMAL LOW (ref 12.0–15.0)
Immature Granulocytes: 1 %
Lymphocytes Relative: 13 %
Lymphs Abs: 1.3 10*3/uL (ref 0.7–4.0)
MCH: 25.8 pg — ABNORMAL LOW (ref 26.0–34.0)
MCHC: 31.9 g/dL (ref 30.0–36.0)
MCV: 80.9 fL (ref 80.0–100.0)
Monocytes Absolute: 0.6 10*3/uL (ref 0.1–1.0)
Monocytes Relative: 6 %
Neutro Abs: 7.7 10*3/uL (ref 1.7–7.7)
Neutrophils Relative %: 80 %
Platelets: 483 10*3/uL — ABNORMAL HIGH (ref 150–400)
RBC: 4.5 MIL/uL (ref 3.87–5.11)
RDW: 13.7 % (ref 11.5–15.5)
WBC: 9.6 10*3/uL (ref 4.0–10.5)
nRBC: 0 % (ref 0.0–0.2)

## 2019-01-25 LAB — FERRITIN: Ferritin: 804 ng/mL — ABNORMAL HIGH (ref 11–307)

## 2019-01-25 LAB — GLUCOSE, CAPILLARY
Glucose-Capillary: 211 mg/dL — ABNORMAL HIGH (ref 70–99)
Glucose-Capillary: 238 mg/dL — ABNORMAL HIGH (ref 70–99)
Glucose-Capillary: 314 mg/dL — ABNORMAL HIGH (ref 70–99)

## 2019-01-25 LAB — D-DIMER, QUANTITATIVE: D-Dimer, Quant: 1.9 ug/mL-FEU — ABNORMAL HIGH (ref 0.00–0.50)

## 2019-01-25 LAB — C-REACTIVE PROTEIN: CRP: 8.9 mg/dL — ABNORMAL HIGH (ref <1.0)

## 2019-01-25 LAB — PROCALCITONIN: Procalcitonin: 16.46 ng/mL

## 2019-01-25 MED ORDER — ENOXAPARIN SODIUM 60 MG/0.6ML ~~LOC~~ SOLN
0.5000 mg/kg | Freq: Every day | SUBCUTANEOUS | Status: DC
  Administered 2019-01-26 – 2019-01-28 (×3): 45 mg via SUBCUTANEOUS
  Filled 2019-01-25 (×3): qty 0.6

## 2019-01-25 MED ORDER — FUROSEMIDE 10 MG/ML IJ SOLN
40.0000 mg | Freq: Once | INTRAMUSCULAR | Status: AC
  Administered 2019-01-25: 15:00:00 40 mg via INTRAVENOUS
  Filled 2019-01-25: qty 4

## 2019-01-25 MED ORDER — INSULIN GLARGINE 100 UNIT/ML ~~LOC~~ SOLN
20.0000 [IU] | Freq: Every day | SUBCUTANEOUS | Status: DC
  Administered 2019-01-26 – 2019-01-27 (×2): 20 [IU] via SUBCUTANEOUS
  Filled 2019-01-25 (×2): qty 0.2

## 2019-01-25 MED ORDER — INSULIN ASPART 100 UNIT/ML ~~LOC~~ SOLN
4.0000 [IU] | Freq: Three times a day (TID) | SUBCUTANEOUS | Status: DC
  Administered 2019-01-25 – 2019-01-27 (×5): 4 [IU] via SUBCUTANEOUS

## 2019-01-25 NOTE — Plan of Care (Signed)
  Problem: Respiratory: Goal: Will maintain a patent airway Outcome: Progressing Goal: Complications related to the disease process, condition or treatment will be avoided or minimized Outcome: Progressing   Problem: Coping: Goal: Psychosocial and spiritual needs will be supported Outcome: Progressing   Problem: Health Behavior/Discharge Planning: Goal: Ability to manage health-related needs will improve Outcome: Progressing   Problem: Education: Goal: Knowledge of General Education information will improve Description: Including pain rating scale, medication(s)/side effects and non-pharmacologic comfort measures Outcome: Progressing   Problem: Activity: Goal: Risk for activity intolerance will decrease Outcome: Progressing   Problem: Clinical Measurements: Goal: Will remain free from infection Outcome: Progressing

## 2019-01-25 NOTE — Progress Notes (Addendum)
PROGRESS NOTE                                                                                                                                                                                                             Patient Demographics:    Amber Aguilar, is a 63 y.o. female, DOB - Jun 12, 1955, [id number]  Outpatient Primary MD for the patient is System, Pcp Not In   Admit date - 01/23/2019   LOS - 2  No chief complaint on file.      Brief Narrative: Patient is a 63 y.o. female with PMHx of RA complicated by interstitial lung disease-on Rituxan infusion and CellCept, COPD, DM-2, HTN-who presented to [facility] with worsening shortness of breath-found to have severe hypoxemia secondary to COVID-19 pneumonia and concurrent bacterial pneumonia.  Patient was subsequently transferred to [facility] on 12/15 for further evaluation and treatment.  See below for further details.   Significant events: 11/24>>covid +  12/4>>12/5: admitted and then discharged from [facility] 12/12>>Readmitted to [facility] 12/15>>transfer to [facility]    Subjective:    Amber Aguilar today feels much better-she was titrated down to 2 L of oxygen this morning.   Assessment  & Plan :  Acute hypoxemic respiratory failure due to severe acute respiratory syndrome coronavirus 2 (SARS-CoV-2) disease and concurrent bacterial pneumonia:  Significantly improved-Down to 2 L of oxygen.  Continue steroids/remdesivir for COVID-19 pneumonia.  Remains on empiric cefepime for bacterial pneumonia.  Inflammatory markers and procalcitonin slowly downtrending.   Given clinical improvement-suspect best to avoid Actemra (elevated procalcitonin-also on CellCept/Rituxan as outpatient).  Fever: afebrile  O2 requirements:  SpO2: (!) 86 % O2 Flow Rate (L/min): 1 L/min   COVID-19 Labs: Recent Labs    01/22/19 1502 01/24/19 0530 01/25/19 0530  DDIMER  --  2.27* 1.90*    FERRITIN 796* 874* 804*  LDH 681*  --   --   CRP 12.8* 17.1* 8.9*       Component Value Date/Time   BNP 51.0 01/22/2019 1502    Recent Labs  Lab 01/22/19 1502 01/24/19 0557 01/25/19 0530  PROCALCITON 32.96 33.04 16.46    Lab Results  Component Value Date   SARSCOV2NAA POSITIVE (A) 01/14/2019     COVID-19 medications: Remdesivir: 12/12>> Steroids: 12/12  Other medications: Lasix: Euvolemic on exam-repeat Lasix 40 mg x 1 to  maintain negative balance. Vancomycin: 12/12>> 12/13 Zithromax: 12/12>> 12/14 Cefepime: 12/12>>  Prone/Incentive Spirometry: encouraged incentive spirometry use 3-4/hour.  DVT Prophylaxis  :  Lovenox   DM-2 (A1c 8.1 on 12/4): CBGs uncontrolled secondary to steroids-increase Lantus to 20 units daily, add 4 units of NovoLog with meals-continue SSI.   CBG (last 3)  Recent Labs    01/24/19 1933 01/25/19 0734 01/25/19 1152  GLUCAP 285* 211* 238*    COPD: No wheezing-continue with as needed bronchodilators.  Chronic diastolic heart failure: Euvolemic-repeat Lasix 40 mg IV x1 to maintain negative balance.  Dyslipidemia: Continue statin  GERD: Continue PPI  RA complicated by ILD: Supportive care-on steroids.  CellCept on hold-on Rituxan infusion every 4 months.   Consults  :  None  Procedures  :  None  ABG:    Component Value Date/Time   HCO3 29.3 (H) 01/22/2019 1530   O2SAT 82.6 01/22/2019 1530    Vent Settings: N/A  Condition - Stable  Family Communication  :  Daughter updated over the phone  Code Status :  Full Code  Diet :  Diet Order            Diet Heart Room service appropriate? Yes; Fluid consistency: Thin  Diet effective now               Disposition Plan  :  Remain hospitalized  Barriers to discharge: Hypoxia requiring O2 supplementation/complete 5 days of IV Remdesivir  Antimicorbials  :    Anti-infectives (From admission, onward)   Start     Dose/Rate Route Frequency Ordered Stop   01/24/19  1300  ceFEPIme (MAXIPIME) 2 g in sodium chloride 0.9 % 100 mL IVPB     2 g 200 mL/hr over 30 Minutes Intravenous Every 8 hours 01/24/19 1141     01/24/19 1115  azithromycin (ZITHROMAX) tablet 500 mg     500 mg Oral Daily 01/24/19 1111 01/24/19 1212   01/24/19 1000  remdesivir 100 mg in sodium chloride 0.9 % 100 mL IVPB     100 mg 200 mL/hr over 30 Minutes Intravenous Daily 01/23/19 2236 01/27/19 0959      Inpatient Medications  Scheduled Meds: . atorvastatin  10 mg Oral Daily  . docusate sodium  100 mg Oral Daily  . enoxaparin (LOVENOX) injection  0.5 mg/kg Subcutaneous Q12H  . insulin aspart  0-15 Units Subcutaneous TID WC  . insulin glargine  15 Units Subcutaneous Daily  . Ipratropium-Albuterol  1 puff Inhalation Q6H  . methylPREDNISolone sodium succinate  40 mg Intravenous Q12H  . pantoprazole  40 mg Oral Daily  . pneumococcal 23 valent vaccine  0.5 mL Intramuscular Tomorrow-1000  . vitamin C  500 mg Oral Daily  . zinc sulfate  220 mg Oral Daily   Continuous Infusions: . ceFEPime (MAXIPIME) IV 2 g (01/25/19 1200)  . remdesivir 100 mg in NS 100 mL 100 mg (01/25/19 0845)   PRN Meds:.acetaminophen, albuterol, chlorpheniramine-HYDROcodone, guaiFENesin-dextromethorphan, hydrOXYzine, ondansetron **OR** ondansetron (ZOFRAN) IV   Time Spent in minutes  25  See all Orders from today for further details   Oren Binet M.D on 01/25/2019 at 1:57 PM  To page go to [email or web address] - use universal password  [facility] -  Office  [phone]    Objective:   Vitals:   01/25/19 0000 01/25/19 0500 01/25/19 0725 01/25/19 0736  BP:  (!) 101/58    Pulse:  67    Resp:  16    Temp:  97.7 F (36.5 C)  97.7 F (36.5 C)  TempSrc:  Oral  Oral  SpO2: 98% 99% 91% (!) 86%  Weight:      Height:        Wt Readings from Last 3 Encounters:  01/23/19 87.9 kg  01/22/19 90.3 kg  01/18/19 90.3 kg    No intake or output data in the 24 hours ending 01/25/19 1357   Physical  Exam Gen Exam:Alert awake-not in any distress HEENT:atraumatic, normocephalic Chest: B/L clear to auscultation anteriorly CVS:S1S2 regular Abdomen:soft non tender, non distended Extremities:no edema Neurology: Non focal Skin: no rash   Data Review:    CBC Recent Labs  Lab 01/18/19 1721 01/22/19 1502 01/23/19 0656 01/24/19 0530 01/25/19 0530  WBC 11.8* 16.6* 10.0 9.6 9.6  HGB 13.5 12.2 11.5* 10.8* 11.6*  HCT 41.2 36.4 35.2* 34.0* 36.4  PLT 533* 448* 385 440* 483*  MCV 78.8* 77.8* 79.3* 80.8 80.9  MCH 25.8* 26.1 25.9* 25.7* 25.8*  MCHC 32.8 33.5 32.7 31.8 31.9  RDW 13.0 12.9 13.4 13.5 13.7  LYMPHSABS  --  2.0  --  1.3 1.3  MONOABS  --  1.0  --  0.7 0.6  EOSABS  --  0.0  --  0.0 0.0  BASOSABS  --  0.0  --  0.0 0.0    Chemistries  Recent Labs  Lab 01/18/19 1721 01/22/19 1502 01/22/19 1705 01/23/19 0656 01/24/19 0530 01/25/19 0530  NA 136 134*  --  139 136 135  K 3.1* 2.9*  --  3.5 3.7 3.6  CL 97* 95*  --  103 101 99  CO2 27 25  --  26 24 26   GLUCOSE 195* 143*  --  165* 238* 253*  BUN 18 19  --  15 15 21   CREATININE 1.07* 1.09*  --  0.64 0.71 0.74  CALCIUM 9.2 8.4*  --  8.0* 8.5* 8.6*  MG  --   --  2.3  --   --   --   AST  --  49*  --   --  37 27  ALT  --  32  --   --  30 34  ALKPHOS  --  52  --   --  56 56  BILITOT  --  0.7  --   --  0.3 0.3   ------------------------------------------------------------------------------------------------------------------ Recent Labs    01/22/19 1502  TRIG 114    Lab Results  Component Value Date   HGBA1C 8.1 (H) 01/14/2019   ------------------------------------------------------------------------------------------------------------------ No results for input(s): TSH, T4TOTAL, T3FREE, THYROIDAB in the last 72 hours.  Invalid input(s): FREET3 ------------------------------------------------------------------------------------------------------------------ Recent Labs    01/24/19 0530 01/25/19 0530  FERRITIN  874* 804*    Coagulation profile Recent Labs  Lab 01/22/19 1705  INR 1.1    Recent Labs    01/24/19 0530 01/25/19 0530  DDIMER 2.27* 1.90*    Cardiac Enzymes No results for input(s): CKMB, TROPONINI, MYOGLOBIN in the last 168 hours.  Invalid input(s): CK ------------------------------------------------------------------------------------------------------------------    Component Value Date/Time   BNP 51.0 01/22/2019 1502    Micro Results Recent Results (from the past 240 hour(s))  Blood Culture (routine x 2)     Status: None (Preliminary result)   Collection Time: 01/22/19  3:01 PM   Specimen: BLOOD  Result Value Ref Range Status   Specimen Description BLOOD RIGHT ANTECUBITAL  Final   Special Requests   Final    BOTTLES DRAWN AEROBIC AND ANAEROBIC Blood Culture adequate volume   Culture  Final    NO GROWTH 3 DAYS Performed at [facility], [address] [place], [place] [address]    Report Status PENDING  Incomplete  Blood Culture (routine x 2)     Status: None (Preliminary result)   Collection Time: 01/22/19  3:07 PM   Specimen: BLOOD  Result Value Ref Range Status   Specimen Description BLOOD LEFT ANTECUBITAL  Final   Special Requests   Final    BOTTLES DRAWN AEROBIC AND ANAEROBIC Blood Culture adequate volume   Culture   Final    NO GROWTH 3 DAYS Performed at [facility], [address] [place], [place] [address]    Report Status PENDING  Incomplete  MRSA PCR Screening     Status: None   Collection Time: 01/22/19 11:44 PM   Specimen: Nasal Mucosa; Nasopharyngeal  Result Value Ref Range Status   MRSA by PCR NEGATIVE NEGATIVE Final    Comment:        The GeneXpert MRSA Assay (FDA approved for NASAL specimens only), is one component of a comprehensive MRSA colonization surveillance program. It is not intended to diagnose MRSA infection nor to guide or monitor treatment for MRSA infections. Performed at [facility], [address], [place], [place] [address]     Radiology Reports DG Chest 1 View  Result Date: 01/14/2019 CLINICAL DATA:  Shortness of breath EXAM: CHEST  1 VIEW COMPARISON:  04/24/2010 FINDINGS: Heart is borderline in size. Mild vascular congestion. Bibasilar opacities, likely atelectasis. Small effusions. No acute bony abnormality. IMPRESSION: Borderline heart size, vascular congestion. Bibasilar atelectasis with small effusions. Electronically Signed   By: Rolm Baptise M.D.   On: 01/14/2019 01:17   DG Chest 2 View  Result Date: 01/18/2019 CLINICAL DATA:  Chest pain. COVID-19 positive patient. Shortness of breath. EXAM: CHEST - 2 VIEW COMPARISON:  January 14, 2019 FINDINGS: Bibasilar infiltrates remain. The heart, hila, mediastinum, lungs, and pleura are otherwise unchanged and unremarkable. Chronic compression fracture in the upper lumbar spine. IMPRESSION: Persistent bibasilar infiltrates. Recommend follow-up to resolution. Persistent bibasilar infiltrates, likely due to the patient's COVID-19 status. No other acute abnormalities are seen. Electronically Signed   By: Dorise Bullion III M.D   On: 01/18/2019 16:11   CT ANGIO CHEST PE W OR WO CONTRAST  Result Date: 01/14/2019 CLINICAL DATA:  Progressive dyspnea and cough. Fever. Hypoxia. Positive COVID-19 test 10 days ago. EXAM: CT ANGIOGRAPHY CHEST WITH CONTRAST TECHNIQUE: Multidetector CT imaging of the chest was performed using the standard protocol during bolus administration of intravenous contrast. Multiplanar CT image reconstructions and MIPs were obtained to evaluate the vascular anatomy. CONTRAST:  37mL OMNIPAQUE IOHEXOL 350 MG/ML SOLN COMPARISON:  One-view chest x-ray 01/14/2019 FINDINGS: Cardiovascular: The heart is mildly enlarged. Aorta and great vessel origins are within normal limits. Pulmonary artery opacification is excellent. No focal filling defects are present to suggest pulmonary embolus. Mediastinum/Nodes: No significant  mediastinal, hilar, or axillary adenopathy is present. Lungs/Pleura: Patchy peripheral airspace opacities are present, predominantly in the lower lobes. Finding is consistent with COVID-19 pneumonia. Broad areas are normal without evidence for edema. No significant pleural effusion is present. Upper Abdomen: Diffuse fatty infiltration of the liver is noted. No focal lesions are present. Upper abdomen is otherwise within normal limits. Musculoskeletal: No chest wall abnormality. No acute or significant osseous findings. Review of the MIP images confirms the above findings. IMPRESSION: 1. No evidence for pulmonary embolus. 2. Patchy peripheral airspace opacities bilaterally compatible with COVID-19 pneumonia. 3.  Cardiomegaly without failure. 4. Hepatic steatosis Electronically Signed   By: San Morelle M.D.   On: 01/14/2019 05:28   DG Chest Port 1 View  Result Date: 01/22/2019 CLINICAL DATA:  Increasing SOB that worsened today. Per ER notes: Per EMS pt had O2 sats of 70 when they arrived at her home and she had SOB and recent positive covid test.Hx of CHF, COPD, DM EXAM: PORTABLE CHEST 1 VIEW COMPARISON:  01/18/2019 and earlier exams. FINDINGS: Bilateral airspace lung opacities, which predominate in the lower lungs, have increased from prior exam. No convincing pleural effusion.  No pneumothorax. Cardiac silhouette is normal in size. IMPRESSION: 1. Interval worsening of lung aeration with increased bilateral airspace lung opacities. Findings consistent with worsened COVID-19 pneumonia. Electronically Signed   By: Lajean Manes M.D.   On: 01/22/2019 15:31

## 2019-01-25 NOTE — Evaluation (Signed)
Physical Therapy Evaluation Patient Details Name: Amber Aguilar MRN: [id number] DOB: [birth year] Today's Date: 01/25/2019   History of Present Illness  Pt is a 63 y.o. female with PMH of rheumatoid arthritis complicated by ILD on CellCept, HTN, DM-2, COPD, who presented to [facility] 01/23/19 with fever and SOB; found to have acute hypoxic respiratory failure requiring high flow oxygen secondary to COVID-19.    Clinical Impression  Pt presents with an overall decrease in functional mobility secondary to above. PTA, pt independent, works and lives with family. Today, pt moving well with supervision to min guard for balance; limited by decreased activity tolerance. SpO2 randomly fluctuating between 78-94% via ear probe on RA (more reliable pleth than finger probe) post-ambulation withprolonged seated rest break, pt asymptomatic, good technique with pursed lip breathing. Pt would benefit from continued acute PT services to maximize functional mobility and independence prior to d/c home.     Follow Up Recommendations No PT follow up;Supervision for mobility/OOB    Equipment Recommendations  (TBD)    Recommendations for Other Services       Precautions / Restrictions Precautions Precautions: Fall Restrictions Weight Bearing Restrictions: No      Mobility  Bed Mobility               General bed mobility comments: In recliner upon arrival  Transfers Overall transfer level: Independent Equipment used: None Transfers: Sit to/from Stand              Ambulation/Gait Ambulation/Gait assistance: Supervision;Min guard Gait Distance (Feet): 150 Feet Assistive device: None Gait Pattern/deviations: Step-through pattern;Decreased stride length Gait velocity: Decreased Gait velocity interpretation: <1.31 ft/sec, indicative of household ambulator General Gait Details: Slow, cautious gait with initial UE support on hallway rail at supervision-level, encouraged walking without UE support  and intermittent min guard. Ambulating on RA, 1x standing rest break due to SOB; unable to get read on pulse ox with ambulation  Stairs            Wheelchair Mobility    Modified Rankin (Stroke Patients Only)       Balance Overall balance assessment: Needs assistance   Sitting balance-Leahy Scale: Good       Standing balance-Leahy Scale: Fair                               Pertinent Vitals/Pain Pain Assessment: No/denies pain    Home Living Family/patient expects to be discharged to:: Private residence Living Arrangements: Spouse/significant other;Children;Other relatives Available Help at Discharge: Family;Available 24 hours/day Type of Home: House Home Access: Stairs to enter Entrance Stairs-Rails: None Entrance Stairs-Number of Steps: 6 Home Layout: One level Home Equipment: None Additional Comments: Three grandchildren (13, 8, 1)    Prior Function Level of Independence: Independent         Comments: 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     Hand Dominance   Dominant Hand: Right    Extremity/Trunk Assessment   Upper Extremity Assessment Upper Extremity Assessment: Overall WFL for tasks assessed    Lower Extremity Assessment Lower Extremity Assessment: Overall WFL for tasks assessed       Communication   Communication: No difficulties  Cognition Arousal/Alertness: Awake/alert Behavior During Therapy: WFL for tasks assessed/performed;Flat affect Overall Cognitive Status: Within Functional Limits for tasks assessed  General Comments General comments (skin integrity, edema, etc.): SpO2 >90% upon entering room with pt in recliner on 1 L O2 (with 3x extensions). Ambulated on RA and unable to get pulse ox read via finger. Returned to room and SpO2 reading 74%, pt asymptomatic; switched to ear probe, reading 78-94% on RA with good pleth, fluctuating back/forth  even though pt relaxed in recliner for >5 min; intermittent pursed lip breathing, continues to fluctuate; returned 1L with 1x extension, maintaining >/87%    Exercises Other Exercises Other Exercises: Pursed lip breathing, prolonged with attempts to regulate SpO2 reading   Assessment/Plan    PT Assessment Patient needs continued PT services  PT Problem List Decreased activity tolerance;Decreased balance;Decreased mobility;Cardiopulmonary status limiting activity       PT Treatment Interventions DME instruction;Gait training;Stair training;Functional mobility training;Therapeutic activities;Therapeutic exercise;Balance training;Patient/family education    PT Goals (Current goals can be found in the Care Plan section)  Acute Rehab PT Goals Patient Stated Goal: "Go home" PT Goal Formulation: With patient Time For Goal Achievement: 02/08/19 Potential to Achieve Goals: Good    Frequency Min 3X/week   Barriers to discharge        Co-evaluation               AM-PAC PT "6 Clicks" Mobility  Outcome Measure Help needed turning from your back to your side while in a flat bed without using bedrails?: None Help needed moving from lying on your back to sitting on the side of a flat bed without using bedrails?: None Help needed moving to and from a bed to a chair (including a wheelchair)?: None Help needed standing up from a chair using your arms (e.g., wheelchair or bedside chair)?: None Help needed to walk in hospital room?: A Little Help needed climbing 3-5 steps with a railing? : A Little 6 Click Score: 22    End of Session   Activity Tolerance: Patient tolerated treatment well Patient left: in chair;with call bell/phone within reach;with nursing/sitter in room Nurse Communication: Mobility status PT Visit Diagnosis: Other abnormalities of gait and mobility (R26.89)    Time: [id number] PT Time Calculation (min) (ACUTE ONLY): 39 min   Charges:   PT Evaluation $PT Eval  Moderate Complexity: 1 Mod PT Treatments $Gait Training: 8-22 mins $Therapeutic Activity: 8-22 mins   Mabeline Caras, PT, DPT Acute Rehabilitation Services  Pager [phone] Office [place] 01/25/2019, 11:40 AM

## 2019-01-26 LAB — CBC WITH DIFFERENTIAL/PLATELET
Abs Immature Granulocytes: 0.25 10*3/uL — ABNORMAL HIGH (ref 0.00–0.07)
Basophils Absolute: 0 10*3/uL (ref 0.0–0.1)
Basophils Relative: 0 %
Eosinophils Absolute: 0 10*3/uL (ref 0.0–0.5)
Eosinophils Relative: 0 %
HCT: 37.2 % (ref 36.0–46.0)
Hemoglobin: 12 g/dL (ref 12.0–15.0)
Immature Granulocytes: 2 %
Lymphocytes Relative: 8 %
Lymphs Abs: 1.3 10*3/uL (ref 0.7–4.0)
MCH: 25.8 pg — ABNORMAL LOW (ref 26.0–34.0)
MCHC: 32.3 g/dL (ref 30.0–36.0)
MCV: 80 fL (ref 80.0–100.0)
Monocytes Absolute: 0.8 10*3/uL (ref 0.1–1.0)
Monocytes Relative: 5 %
Neutro Abs: 13.3 10*3/uL — ABNORMAL HIGH (ref 1.7–7.7)
Neutrophils Relative %: 85 %
Platelets: 528 10*3/uL — ABNORMAL HIGH (ref 150–400)
RBC: 4.65 MIL/uL (ref 3.87–5.11)
RDW: 13.5 % (ref 11.5–15.5)
WBC: 15.7 10*3/uL — ABNORMAL HIGH (ref 4.0–10.5)
nRBC: 0 % (ref 0.0–0.2)

## 2019-01-26 LAB — COMPREHENSIVE METABOLIC PANEL
ALT: 39 U/L (ref 0–44)
AST: 28 U/L (ref 15–41)
Albumin: 3.1 g/dL — ABNORMAL LOW (ref 3.5–5.0)
Alkaline Phosphatase: 63 U/L (ref 38–126)
Anion gap: 13 (ref 5–15)
BUN: 21 mg/dL (ref 8–23)
CO2: 23 mmol/L (ref 22–32)
Calcium: 8.8 mg/dL — ABNORMAL LOW (ref 8.9–10.3)
Chloride: 103 mmol/L (ref 98–111)
Creatinine, Ser: 0.75 mg/dL (ref 0.44–1.00)
GFR calc Af Amer: >60 mL/min (ref >60)
GFR calc non Af Amer: >60 mL/min (ref >60)
Glucose, Bld: 198 mg/dL — ABNORMAL HIGH (ref 70–99)
Potassium: 3.8 mmol/L (ref 3.5–5.1)
Sodium: 139 mmol/L (ref 135–145)
Total Bilirubin: 0.3 mg/dL (ref 0.3–1.2)
Total Protein: 6.7 g/dL (ref 6.5–8.1)

## 2019-01-26 LAB — C-REACTIVE PROTEIN: CRP: 5 mg/dL — ABNORMAL HIGH (ref <1.0)

## 2019-01-26 LAB — FERRITIN: Ferritin: 670 ng/mL — ABNORMAL HIGH (ref 11–307)

## 2019-01-26 LAB — GLUCOSE, CAPILLARY
Glucose-Capillary: 355 mg/dL — ABNORMAL HIGH (ref 70–99)
Glucose-Capillary: 217 mg/dL — ABNORMAL HIGH (ref 70–99)
Glucose-Capillary: 238 mg/dL — ABNORMAL HIGH (ref 70–99)
Glucose-Capillary: 188 mg/dL — ABNORMAL HIGH (ref 70–99)
Glucose-Capillary: 311 mg/dL — ABNORMAL HIGH (ref 70–99)

## 2019-01-26 LAB — D-DIMER, QUANTITATIVE: D-Dimer, Quant: 2.05 ug/mL-FEU — ABNORMAL HIGH (ref 0.00–0.50)

## 2019-01-26 LAB — PROCALCITONIN: Procalcitonin: 7.76 ng/mL

## 2019-01-26 MED ORDER — MAGIC MOUTHWASH W/LIDOCAINE
10.0000 mL | Freq: Three times a day (TID) | ORAL | Status: DC | PRN
  Administered 2019-01-26 – 2019-01-27 (×4): 10 mL via ORAL
  Administered 2019-01-28: 5 mL via ORAL
  Administered 2019-01-28 – 2019-01-29 (×4): 10 mL via ORAL
  Filled 2019-01-26 (×10): qty 10

## 2019-01-26 MED ORDER — SODIUM CHLORIDE 0.9 % IV SOLN
1.0000 g | INTRAVENOUS | Status: AC
  Administered 2019-01-26 – 2019-01-28 (×3): 1 g via INTRAVENOUS
  Filled 2019-01-26 (×3): qty 10

## 2019-01-26 MED ORDER — FUROSEMIDE 10 MG/ML IJ SOLN
40.0000 mg | Freq: Once | INTRAMUSCULAR | Status: AC
  Administered 2019-01-26: 40 mg via INTRAVENOUS
  Filled 2019-01-26: qty 4

## 2019-01-26 MED ORDER — FLUCONAZOLE 100 MG PO TABS
100.0000 mg | ORAL_TABLET | Freq: Every day | ORAL | Status: DC
  Administered 2019-01-27 – 2019-01-29 (×3): 100 mg via ORAL
  Filled 2019-01-26 (×3): qty 1

## 2019-01-26 MED ORDER — METHYLPREDNISOLONE SODIUM SUCC 40 MG IJ SOLR
20.0000 mg | Freq: Two times a day (BID) | INTRAMUSCULAR | Status: DC
  Administered 2019-01-26 – 2019-01-27 (×2): 20 mg via INTRAVENOUS
  Filled 2019-01-26 (×2): qty 1

## 2019-01-26 MED ORDER — FLUCONAZOLE 200 MG PO TABS
200.0000 mg | ORAL_TABLET | Freq: Once | ORAL | Status: AC
  Administered 2019-01-26: 15:00:00 200 mg via ORAL
  Filled 2019-01-26: qty 1

## 2019-01-26 NOTE — Progress Notes (Signed)
Family updated by pt.  

## 2019-01-26 NOTE — Consult Note (Signed)
Pharmacy Antibiotic Note  Amber Aguilar is a 63 y.o. female admitted on 01/23/2019 with sepsis.  Pharmacy has been consulted for Cefepime dosing. Pharmacy is consulted to add Fluconazole dosing for oropharyngeal candidiasis.  Plan: Continue cefepime to 2 g IV q8h Fluconazole 200mg  x1 then 100mg  PO daily  Height: 5\' 2"  (157.5 cm) Weight: 193 lb 12.6 oz (87.9 kg) IBW/kg (Calculated) : 50.1  Temp (24hrs), Avg:97.6 F (36.4 C), Min:97.3 F (36.3 C), Max:97.8 F (36.6 C)  Recent Labs  Lab 01/22/19 1502 01/22/19 1705 01/23/19 0656 01/24/19 0530 01/25/19 0530 01/26/19 0210  WBC 16.6*  --  10.0 9.6 9.6 15.7*  CREATININE 1.09*  --  0.64 0.71 0.74 0.75  LATICACIDVEN 1.8 1.4  --   --   --   --     Estimated Creatinine Clearance: 76 mL/min (by C-G formula based on SCr of 0.75 mg/dL).    Allergies  Allergen Reactions  . Benzalkonium Chloride Other (See Comments)  . Neosporin [Neomycin-Bacitracin Zn-Polymyx]     Antimicrobials this admission: Vancomycin 12/12 >> 12/13 Cefepime 12/12 >> Azithro 12/12 >> 12/14 Remdes 12/12 >> (12/16) Fluconazole 12/16 >>  Microbiology results: 12/12 BCx: NGTD 12/12 MRSA PCR: negative 12/4 COVID + 11/24 COVID + ([facility])  Thank you for allowing pharmacy to be a part of this patient's care.  Gretta Arab PharmD, BCPS Clinical pharmacist phone 7am- 5pm: [phone] 01/26/2019 3:43 PM

## 2019-01-26 NOTE — Progress Notes (Signed)
Inpatient Diabetes Program Recommendations  AACE/ADA: New Consensus Statement on Inpatient Glycemic Control (2015)  Target Ranges:  Prepandial:   less than 140 mg/dL      Peak postprandial:   less than 180 mg/dL (1-2 hours)      Critically ill patients:  140 - 180 mg/dL   Lab Results  Component Value Date   GLUCAP 238 (H) 01/26/2019   HGBA1C 8.1 (H) 01/14/2019    Review of Glycemic Control Results for Amber Aguilar, Amber Aguilar (MRN [id number]) as of 01/26/2019 11:38  Ref. Range 01/25/2019 11:52 01/25/2019 15:30 01/25/2019 20:58 01/26/2019 08:27 01/26/2019 11:08  Glucose-Capillary Latest Ref Range: 70 - 99 mg/dL 238 (H) 314 (H) 355 (H) 217 (H) 238 (H)   Diabetes history: DM 2 Outpatient Diabetes medications:  Metformin 1000 mg bid Current orders for Inpatient glycemic control: Lantus 20 units daily, Novolog moderate tid with meals, Novolog 4 units tid with meals Solumedrol 40 mg IV q 12 hours  Inpatient Diabetes Program Recommendations:    Please consider increasing Lantus to 25 units daily.  Also consider increasing Novolog meal coverage to 5 units tid with meals.   Thanks,  Adah Perl, RN, BC-ADM Inpatient Diabetes Coordinator Pager [phone]

## 2019-01-26 NOTE — Plan of Care (Signed)

## 2019-01-26 NOTE — Progress Notes (Addendum)
PROGRESS NOTE                                                                                                                                                                                                             Patient Demographics:    Amber Aguilar, is a 63 y.o. female, DOB - 03-29-1955, [id number]  Outpatient Primary MD for the patient is System, Pcp Not In   Admit date - 01/23/2019   LOS - 3  No chief complaint on file.      Brief Narrative: Patient is a 63 y.o. female with PMHx of RA complicated by interstitial lung disease-on Rituxan infusion and CellCept, COPD, DM-2, HTN-who presented to [facility] with worsening shortness of breath-found to have severe hypoxemia secondary to COVID-19 pneumonia and concurrent bacterial pneumonia.  Patient was subsequently transferred to [facility] on 12/15 for further evaluation and treatment.  See below for further details.  Significant events: 11/24>>covid +  12/4>>12/5: admitted and then discharged from [facility] 12/12>>Readmitted to [facility] 12/15>>transfer to [facility]   Subjective:    Amber Aguilar today continues to improve-she is down to 1 L of oxygen.  She does not have any major complaints.   Assessment  & Plan :  Acute hypoxemic respiratory failure due to severe acute respiratory syndrome coronavirus 2 (SARS-CoV-2) disease and concurrent bacterial pneumonia:  Continues to improve-on 1 L of oxygen this morning.  Will complete remdesivir 12/16.  Remains on steroids-but will begin to taper.  Suspect we can now narrow antibiotics to just ceftriaxone-we will plan on total of 7 days of empiric treatment.   Fever: afebrile  O2 requirements:  SpO2: 96 % O2 Flow Rate (L/min): 1 L/min   COVID-19 Labs: Recent Labs    01/24/19 0530 01/25/19 0530 01/26/19 0210  DDIMER 2.27* 1.90* 2.05*  FERRITIN 874* 804* 670*  CRP 17.1* 8.9* 5.0*       Component Value Date/Time   BNP  51.0 01/22/2019 1502    Recent Labs  Lab 01/22/19 1502 01/24/19 0557 01/25/19 0530 01/26/19 0210  PROCALCITON 32.96 33.04 16.46 7.76    Lab Results  Component Value Date   SARSCOV2NAA POSITIVE (A) 01/14/2019     COVID-19 medications: Remdesivir: 12/12>> Steroids: 12/12  Other medications: Lasix: Euvolemic on exam-repeat Lasix 40 mg x 1 to maintain negative balance. Vancomycin: 12/12>> 12/13 Zithromax:  12/12>> 12/14 Cefepime: 12/12>>12/16 Rocephin: 12/16-planned stop date 12/18  Prone/Incentive Spirometry: encouraged incentive spirometry use 3-4/hour.  DVT Prophylaxis  :  Lovenox   Oral candidiasis: Continue Diflucan  DM-2 (A1c 8.1 on 12/4): CBGs-continue Lantus 20 units daily, 4 units of NovoLog with meals and SSI.  Steroids are being tapered down-suspect that CBGs will continue to improve over the next few days.    CBG (last 3)  Recent Labs    01/25/19 2058 01/26/19 0827 01/26/19 1108  GLUCAP 355* 217* 238*    COPD: No wheezing-continue with as needed bronchodilators.  Chronic diastolic heart failure: Euvolemic-repeat Lasix 40 mg IV x1 today.  Dyslipidemia: Continue statin  GERD: Continue PPI  RA complicated by ILD: Supportive care-on steroids.  CellCept on hold-on Rituxan infusion every 4 months.   Consults  :  None  Procedures  :  None  ABG:    Component Value Date/Time   HCO3 29.3 (H) 01/22/2019 1530   O2SAT 82.6 01/22/2019 1530    Vent Settings: N/A  Condition - Stable  Family Communication  :  Daughter updated over the phone 12/16  Code Status :  Full Code  Diet :  Diet Order            Diet Heart Room service appropriate? Yes; Fluid consistency: Thin  Diet effective now               Disposition Plan  :  Remain hospitalized  Barriers to discharge: Hypoxia requiring O2 supplementation/complete 5 days of IV Remdesivir  Antimicorbials  :    Anti-infectives (From admission, onward)   Start     Dose/Rate Route  Frequency Ordered Stop   01/27/19 1000  fluconazole (DIFLUCAN) tablet 100 mg     100 mg Oral Daily 01/26/19 1218     01/26/19 1400  fluconazole (DIFLUCAN) tablet 200 mg     200 mg Oral  Once 01/26/19 1218 01/26/19 1449   01/24/19 1300  ceFEPIme (MAXIPIME) 2 g in sodium chloride 0.9 % 100 mL IVPB     2 g 200 mL/hr over 30 Minutes Intravenous Every 8 hours 01/24/19 1141     01/24/19 1115  azithromycin (ZITHROMAX) tablet 500 mg     500 mg Oral Daily 01/24/19 1111 01/24/19 1212   01/24/19 1000  remdesivir 100 mg in sodium chloride 0.9 % 100 mL IVPB     100 mg 200 mL/hr over 30 Minutes Intravenous Daily 01/23/19 2236 01/26/19 0950      Inpatient Medications  Scheduled Meds: . atorvastatin  10 mg Oral Daily  . docusate sodium  100 mg Oral Daily  . enoxaparin (LOVENOX) injection  0.5 mg/kg Subcutaneous Daily  . [START ON 01/27/2019] fluconazole  100 mg Oral Daily  . insulin aspart  0-15 Units Subcutaneous TID WC  . insulin aspart  4 Units Subcutaneous TID WC  . insulin glargine  20 Units Subcutaneous Daily  . Ipratropium-Albuterol  1 puff Inhalation Q6H  . methylPREDNISolone sodium succinate  40 mg Intravenous Q12H  . pantoprazole  40 mg Oral Daily  . pneumococcal 23 valent vaccine  0.5 mL Intramuscular Tomorrow-1000  . vitamin C  500 mg Oral Daily  . zinc sulfate  220 mg Oral Daily   Continuous Infusions: . ceFEPime (MAXIPIME) IV 2 g (01/26/19 1205)   PRN Meds:.acetaminophen, albuterol, chlorpheniramine-HYDROcodone, guaiFENesin-dextromethorphan, hydrOXYzine, magic mouthwash w/lidocaine, ondansetron **OR** ondansetron (ZOFRAN) IV   Time Spent in minutes  25  See all Orders from today for further details  Oren Binet M.D on 01/26/2019 at 4:04 PM  To page go to [email or web address] - use universal password  [facility] -  Office  [phone]    Objective:   Vitals:   01/26/19 0525 01/26/19 0526 01/26/19 0923 01/26/19 1220  BP: 112/62     Pulse:      Resp: (!) 24  19  18   Temp: 97.7 F (36.5 C)  97.8 F (36.6 C) (!) 97.3 F (36.3 C)  TempSrc: Oral  Oral Axillary  SpO2: 95%  97% 96%  Weight:      Height:        Wt Readings from Last 3 Encounters:  01/23/19 87.9 kg  01/22/19 90.3 kg  01/18/19 90.3 kg     Intake/Output Summary (Last 24 hours) at 01/26/2019 1604 Last data filed at 01/26/2019 1300 Gross per 24 hour  Intake 879.11 ml  Output 4 ml  Net 875.11 ml     Physical Exam Gen Exam:Alert awake-not in any distress HEENT:atraumatic, normocephalic Chest: B/L clear to auscultation anteriorly CVS:S1S2 regular Abdomen:soft non tender, non distended Extremities:no edema Neurology: Non focal Skin: no rash   Data Review:    CBC Recent Labs  Lab 01/22/19 1502 01/23/19 0656 01/24/19 0530 01/25/19 0530 01/26/19 0210  WBC 16.6* 10.0 9.6 9.6 15.7*  HGB 12.2 11.5* 10.8* 11.6* 12.0  HCT 36.4 35.2* 34.0* 36.4 37.2  PLT 448* 385 440* 483* 528*  MCV 77.8* 79.3* 80.8 80.9 80.0  MCH 26.1 25.9* 25.7* 25.8* 25.8*  MCHC 33.5 32.7 31.8 31.9 32.3  RDW 12.9 13.4 13.5 13.7 13.5  LYMPHSABS 2.0  --  1.3 1.3 1.3  MONOABS 1.0  --  0.7 0.6 0.8  EOSABS 0.0  --  0.0 0.0 0.0  BASOSABS 0.0  --  0.0 0.0 0.0    Chemistries  Recent Labs  Lab 01/22/19 1502 01/22/19 1705 01/23/19 0656 01/24/19 0530 01/25/19 0530 01/26/19 0210  NA 134*  --  139 136 135 139  K 2.9*  --  3.5 3.7 3.6 3.8  CL 95*  --  103 101 99 103  CO2 25  --  26 24 26 23   GLUCOSE 143*  --  165* 238* 253* 198*  BUN 19  --  15 15 21 21   CREATININE 1.09*  --  0.64 0.71 0.74 0.75  CALCIUM 8.4*  --  8.0* 8.5* 8.6* 8.8*  MG  --  2.3  --   --   --   --   AST 49*  --   --  37 27 28  ALT 32  --   --  30 34 39  ALKPHOS 52  --   --  56 56 63  BILITOT 0.7  --   --  0.3 0.3 0.3   ------------------------------------------------------------------------------------------------------------------ No results for input(s): CHOL, HDL, LDLCALC, TRIG, CHOLHDL, LDLDIRECT in the last 72  hours.  Lab Results  Component Value Date   HGBA1C 8.1 (H) 01/14/2019   ------------------------------------------------------------------------------------------------------------------ No results for input(s): TSH, T4TOTAL, T3FREE, THYROIDAB in the last 72 hours.  Invalid input(s): FREET3 ------------------------------------------------------------------------------------------------------------------ Recent Labs    01/25/19 0530 01/26/19 0210  FERRITIN 804* 670*    Coagulation profile Recent Labs  Lab 01/22/19 1705  INR 1.1    Recent Labs    01/25/19 0530 01/26/19 0210  DDIMER 1.90* 2.05*    Cardiac Enzymes No results for input(s): CKMB, TROPONINI, MYOGLOBIN in the last 168 hours.  Invalid input(s): CK ------------------------------------------------------------------------------------------------------------------    Component Value Date/Time  BNP 51.0 01/22/2019 1502    Micro Results Recent Results (from the past 240 hour(s))  Blood Culture (routine x 2)     Status: None (Preliminary result)   Collection Time: 01/22/19  3:01 PM   Specimen: BLOOD  Result Value Ref Range Status   Specimen Description BLOOD RIGHT ANTECUBITAL  Final   Special Requests   Final    BOTTLES DRAWN AEROBIC AND ANAEROBIC Blood Culture adequate volume   Culture   Final    NO GROWTH 4 DAYS Performed at [facility], [address] [place], [place] [address]    Report Status PENDING  Incomplete  Blood Culture (routine x 2)     Status: None (Preliminary result)   Collection Time: 01/22/19  3:07 PM   Specimen: BLOOD  Result Value Ref Range Status   Specimen Description BLOOD LEFT ANTECUBITAL  Final   Special Requests   Final    BOTTLES DRAWN AEROBIC AND ANAEROBIC Blood Culture adequate volume   Culture   Final    NO GROWTH 4 DAYS Performed at [facility], [address] [place], [place] [address]    Report Status PENDING  Incomplete  MRSA PCR Screening      Status: None   Collection Time: 01/22/19 11:44 PM   Specimen: Nasal Mucosa; Nasopharyngeal  Result Value Ref Range Status   MRSA by PCR NEGATIVE NEGATIVE Final    Comment:        The GeneXpert MRSA Assay (FDA approved for NASAL specimens only), is one component of a comprehensive MRSA colonization surveillance program. It is not intended to diagnose MRSA infection nor to guide or monitor treatment for MRSA infections. Performed at [facility], [address], [place], [place] [address]     Radiology Reports DG Chest 1 View  Result Date: 01/14/2019 CLINICAL DATA:  Shortness of breath EXAM: CHEST  1 VIEW COMPARISON:  04/24/2010 FINDINGS: Heart is borderline in size. Mild vascular congestion. Bibasilar opacities, likely atelectasis. Small effusions. No acute bony abnormality. IMPRESSION: Borderline heart size, vascular congestion. Bibasilar atelectasis with small effusions. Electronically Signed   By: Rolm Baptise M.D.   On: 01/14/2019 01:17   DG Chest 2 View  Result Date: 01/18/2019 CLINICAL DATA:  Chest pain. COVID-19 positive patient. Shortness of breath. EXAM: CHEST - 2 VIEW COMPARISON:  January 14, 2019 FINDINGS: Bibasilar infiltrates remain. The heart, hila, mediastinum, lungs, and pleura are otherwise unchanged and unremarkable. Chronic compression fracture in the upper lumbar spine. IMPRESSION: Persistent bibasilar infiltrates. Recommend follow-up to resolution. Persistent bibasilar infiltrates, likely due to the patient's COVID-19 status. No other acute abnormalities are seen. Electronically Signed   By: Dorise Bullion III M.D   On: 01/18/2019 16:11   CT ANGIO CHEST PE W OR WO CONTRAST  Result Date: 01/14/2019 CLINICAL DATA:  Progressive dyspnea and cough. Fever. Hypoxia. Positive COVID-19 test 10 days ago. EXAM: CT ANGIOGRAPHY CHEST WITH CONTRAST TECHNIQUE: Multidetector CT imaging of the chest was performed using the standard protocol during bolus administration  of intravenous contrast. Multiplanar CT image reconstructions and MIPs were obtained to evaluate the vascular anatomy. CONTRAST:  103mL OMNIPAQUE IOHEXOL 350 MG/ML SOLN COMPARISON:  One-view chest x-ray 01/14/2019 FINDINGS: Cardiovascular: The heart is mildly enlarged. Aorta and great vessel origins are within normal limits. Pulmonary artery opacification is excellent. No focal filling defects are present to suggest pulmonary embolus. Mediastinum/Nodes: No significant mediastinal, hilar, or axillary adenopathy is present. Lungs/Pleura: Patchy peripheral airspace opacities are present, predominantly in the lower  lobes. Finding is consistent with COVID-19 pneumonia. Broad areas are normal without evidence for edema. No significant pleural effusion is present. Upper Abdomen: Diffuse fatty infiltration of the liver is noted. No focal lesions are present. Upper abdomen is otherwise within normal limits. Musculoskeletal: No chest wall abnormality. No acute or significant osseous findings. Review of the MIP images confirms the above findings. IMPRESSION: 1. No evidence for pulmonary embolus. 2. Patchy peripheral airspace opacities bilaterally compatible with COVID-19 pneumonia. 3. Cardiomegaly without failure. 4. Hepatic steatosis Electronically Signed   By: San Morelle M.D.   On: 01/14/2019 05:28   DG Chest Port 1 View  Result Date: 01/22/2019 CLINICAL DATA:  Increasing SOB that worsened today. Per ER notes: Per EMS pt had O2 sats of 70 when they arrived at her home and she had SOB and recent positive covid test.Hx of CHF, COPD, DM EXAM: PORTABLE CHEST 1 VIEW COMPARISON:  01/18/2019 and earlier exams. FINDINGS: Bilateral airspace lung opacities, which predominate in the lower lungs, have increased from prior exam. No convincing pleural effusion.  No pneumothorax. Cardiac silhouette is normal in size. IMPRESSION: 1. Interval worsening of lung aeration with increased bilateral airspace lung opacities. Findings  consistent with worsened COVID-19 pneumonia. Electronically Signed   By: Lajean Manes M.D.   On: 01/22/2019 15:31

## 2019-01-27 LAB — CBC WITH DIFFERENTIAL/PLATELET
Abs Immature Granulocytes: 0.37 10*3/uL — ABNORMAL HIGH (ref 0.00–0.07)
Basophils Absolute: 0.1 10*3/uL (ref 0.0–0.1)
Basophils Relative: 1 %
Eosinophils Absolute: 0 10*3/uL (ref 0.0–0.5)
Eosinophils Relative: 0 %
HCT: 37.7 % (ref 36.0–46.0)
Hemoglobin: 12.1 g/dL (ref 12.0–15.0)
Immature Granulocytes: 2 %
Lymphocytes Relative: 11 %
Lymphs Abs: 1.7 10*3/uL (ref 0.7–4.0)
MCH: 25.7 pg — ABNORMAL LOW (ref 26.0–34.0)
MCHC: 32.1 g/dL (ref 30.0–36.0)
MCV: 80 fL (ref 80.0–100.0)
Monocytes Absolute: 0.8 10*3/uL (ref 0.1–1.0)
Monocytes Relative: 5 %
Neutro Abs: 12.3 10*3/uL — ABNORMAL HIGH (ref 1.7–7.7)
Neutrophils Relative %: 81 %
Platelets: 457 10*3/uL — ABNORMAL HIGH (ref 150–400)
RBC: 4.71 MIL/uL (ref 3.87–5.11)
RDW: 13.5 % (ref 11.5–15.5)
WBC: 15.2 10*3/uL — ABNORMAL HIGH (ref 4.0–10.5)
nRBC: 0 % (ref 0.0–0.2)

## 2019-01-27 LAB — COMPREHENSIVE METABOLIC PANEL
ALT: 38 U/L (ref 0–44)
AST: 22 U/L (ref 15–41)
Albumin: 3.2 g/dL — ABNORMAL LOW (ref 3.5–5.0)
Alkaline Phosphatase: 65 U/L (ref 38–126)
Anion gap: 14 (ref 5–15)
BUN: 24 mg/dL — ABNORMAL HIGH (ref 8–23)
CO2: 23 mmol/L (ref 22–32)
Calcium: 8.7 mg/dL — ABNORMAL LOW (ref 8.9–10.3)
Chloride: 100 mmol/L (ref 98–111)
Creatinine, Ser: 0.73 mg/dL (ref 0.44–1.00)
GFR calc Af Amer: >60 mL/min (ref >60)
GFR calc non Af Amer: >60 mL/min (ref >60)
Glucose, Bld: 281 mg/dL — ABNORMAL HIGH (ref 70–99)
Potassium: 4 mmol/L (ref 3.5–5.1)
Sodium: 137 mmol/L (ref 135–145)
Total Bilirubin: 0.7 mg/dL (ref 0.3–1.2)
Total Protein: 6.7 g/dL (ref 6.5–8.1)

## 2019-01-27 LAB — C-REACTIVE PROTEIN: CRP: 2.8 mg/dL — ABNORMAL HIGH (ref <1.0)

## 2019-01-27 LAB — GLUCOSE, CAPILLARY
Glucose-Capillary: 211 mg/dL — ABNORMAL HIGH (ref 70–99)
Glucose-Capillary: 277 mg/dL — ABNORMAL HIGH (ref 70–99)
Glucose-Capillary: 458 mg/dL — ABNORMAL HIGH (ref 70–99)
Glucose-Capillary: 236 mg/dL — ABNORMAL HIGH (ref 70–99)

## 2019-01-27 LAB — CULTURE, BLOOD (ROUTINE X 2)
Culture: NO GROWTH
Culture: NO GROWTH
Special Requests: ADEQUATE
Special Requests: ADEQUATE

## 2019-01-27 LAB — D-DIMER, QUANTITATIVE: D-Dimer, Quant: 3.95 ug/mL-FEU — ABNORMAL HIGH (ref 0.00–0.50)

## 2019-01-27 LAB — FERRITIN: Ferritin: 500 ng/mL — ABNORMAL HIGH (ref 11–307)

## 2019-01-27 MED ORDER — PREDNISONE 20 MG PO TABS
40.0000 mg | ORAL_TABLET | Freq: Every day | ORAL | Status: DC
  Administered 2019-01-28: 08:00:00 40 mg via ORAL
  Filled 2019-01-27: qty 2

## 2019-01-27 MED ORDER — FUROSEMIDE 10 MG/ML IJ SOLN
40.0000 mg | Freq: Once | INTRAMUSCULAR | Status: AC
  Administered 2019-01-27: 13:00:00 40 mg via INTRAVENOUS
  Filled 2019-01-27: qty 4

## 2019-01-27 MED ORDER — INSULIN ASPART 100 UNIT/ML ~~LOC~~ SOLN
6.0000 [IU] | Freq: Three times a day (TID) | SUBCUTANEOUS | Status: DC
  Administered 2019-01-27 – 2019-01-28 (×4): 6 [IU] via SUBCUTANEOUS

## 2019-01-27 MED ORDER — INSULIN GLARGINE 100 UNIT/ML ~~LOC~~ SOLN
25.0000 [IU] | Freq: Every day | SUBCUTANEOUS | Status: DC
  Administered 2019-01-28: 12:00:00 25 [IU] via SUBCUTANEOUS
  Filled 2019-01-27: qty 0.25

## 2019-01-27 NOTE — Progress Notes (Signed)
Physical Therapy Treatment Patient Details Name: Amber Aguilar MRN: [id number] DOB: [birth year] Today's Date: 01/27/2019    History of Present Illness Pt is a 63 y.o. female with PMH of rheumatoid arthritis complicated by ILD, HTN, DM-2, COPD, who presented to [facility] 01/23/19 with fever and SOB; found to have acute hypoxic respiratory failure requiring high flow oxygen secondary to COVID-19.   PT Comments    Pt progressing well with mobility. Improved stability and confidence with hallway ambulation this session; supervision for safety. SpO2 >/90% on RA, but unable to get reliable pulse ox pleth via ear probe. Pt hopeful for d/c home tomorrow. Reviewed educ re: energy conservation and activity recommendations. If to remain admitted, will continue to follow acutely.    Follow Up Recommendations  No PT follow up;Supervision for mobility/OOB     Equipment Recommendations  None recommended by PT    Recommendations for Other Services       Precautions / Restrictions Precautions Precautions: Other (comment) Precaution Comments: Difficult to get pulse ox read Restrictions Weight Bearing Restrictions: No    Mobility  Bed Mobility               General bed mobility comments: In recliner upon arrival  Transfers Overall transfer level: Independent Equipment used: None                Ambulation/Gait Ambulation/Gait assistance: Supervision Gait Distance (Feet): 300 Feet Assistive device: None Gait Pattern/deviations: Step-through pattern;Decreased stride length Gait velocity: Decreased Gait velocity interpretation: <1.8 ft/sec, indicate of risk for recurrent falls General Gait Details: Slow, steady gait without UE support, supervision for safety. Pulse ox reading >/90% on RA, unable to get reliable pleth via ear probe. 1x standing rest break with pursed lip breathing. DOE 2/4   Marine scientist [employer] (Stroke Patients Only)       Balance Overall balance assessment: Needs assistance   Sitting balance-Leahy Scale: Good       Standing balance-Leahy Scale: Good                              Cognition Arousal/Alertness: Awake/alert Behavior During Therapy: WFL for tasks assessed/performed;Flat affect Overall Cognitive Status: Within Functional Limits for tasks assessed                                        Exercises      General Comments        Pertinent Vitals/Pain Pain Assessment: No/denies pain    Home Living                      Prior Function            PT Goals (current goals can now be found in the care plan section) Progress towards PT goals: Progressing toward goals    Frequency    Min 3X/week      PT Plan Current plan remains appropriate    Co-evaluation              AM-PAC PT "6 Clicks" Mobility   Outcome Measure  Help needed turning from your back to your side while in a flat bed without using bedrails?: None Help needed moving from lying on your back to sitting on  the side of a flat bed without using bedrails?: None Help needed moving to and from a bed to a chair (including a wheelchair)?: None Help needed standing up from a chair using your arms (e.g., wheelchair or bedside chair)?: None Help needed to walk in hospital room?: None Help needed climbing 3-5 steps with a railing? : A Little 6 Click Score: 23    End of Session   Activity Tolerance: Patient tolerated treatment well Patient left: in chair;with call bell/phone within reach Nurse Communication: Mobility status PT Visit Diagnosis: Other abnormalities of gait and mobility (R26.89)     Time: [id number] PT Time Calculation (min) (ACUTE ONLY): 18 min  Charges:  $Therapeutic Exercise: 8-22 mins                    Mabeline Caras, PT, DPT Acute Rehabilitation Services  Pager [phone] Office [facility] 01/27/2019, 12:26 PM

## 2019-01-27 NOTE — Progress Notes (Signed)
PROGRESS NOTE                                                                                                                                                                                                             Patient Demographics:    Amber Aguilar, is a 63 y.o. female, DOB - 06-Jul-1955, [id number]  Outpatient Primary MD for the patient is System, Pcp Not In   Admit date - 01/23/2019   LOS - 4  No chief complaint on file.      Brief Narrative: Patient is a 64 y.o. 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  Patient was subsequently transferred to [facility] on 12/15 for further evaluation and treatment.  See below for further details.  Significant events: 11/24>>covid +  12/4>>12/5: admitted and then discharged from [facility] 12/12>>Readmitted to [facility] 12/15>>transfer to [facility]   Subjective:    Amber Aguilar continues to feel better-she is just on 1 L of oxygen.  She does feel mild shortness of breath with ambulation-but per RN staff she does not appear tachypneic when she walks.   Assessment  & Plan :  Acute hypoxemic respiratory failure due to severe acute respiratory syndrome coronavirus 2 (SARS-CoV-2) disease and concurrent bacterial pneumonia:  Continues to improve-just on 1 L of oxygen-RN working to see if we can get her off oxygen completely.  But suspect may require home O2 on discharge.  Has completed remdesivir 12/16-on tapering steroids.  Inflammatory markers are downtrending.  Will complete 1 week course of antimicrobial therapy on 12/18.  Suspect can be discharged home on either 12/18 or 12/19.  Fever: afebrile  O2 requirements:  SpO2: 98 % O2 Flow Rate (L/min): 1 L/min     COVID-19 Labs: Recent Labs    01/25/19 0530 01/26/19 0210 01/27/19 0335  DDIMER 1.90* 2.05* 3.95*  FERRITIN 804* 670* 500*  CRP 8.9* 5.0* 2.8*       Component Value Date/Time   BNP 51.0 01/22/2019 1502    Recent Labs  Lab 01/22/19 1502 01/24/19 0557 01/25/19 0530 01/26/19 0210  PROCALCITON 32.96 33.04 16.46 7.76    Lab Results  Component Value Date   SARSCOV2NAA  POSITIVE (A) 01/14/2019     COVID-19 medications: Remdesivir: 12/12>> 12/16 Steroids: 12/12  Other medications: Lasix: Euvolemic on exam-repeat Lasix 40 mg x 1 to maintain negative balance. Vancomycin: 12/12>> 12/13 Zithromax: 12/12>> 12/14 Cefepime: 12/12>>12/16 Rocephin: 12/16-planned stop date 12/18  Prone/Incentive Spirometry: encouraged incentive spirometry use 3-4/hour.  DVT Prophylaxis  :  Lovenox   Oral candidiasis: Continue Diflucan  DM-2 (A1c 8.1 on 12/4):  CBGs remain elevated-we will increase Lantus to 25 units daily, increase NovoLog to 6 units with meals-continue with SSI.  Suspect as steroids get tapered down-insulin requirements will improve over the next few days.    CBG (last 3)  Recent Labs    01/26/19 1652 01/26/19 2025 01/27/19 0814  GLUCAP 188* 311* 211*    COPD: No wheezing-continue with as needed bronchodilators.  Chronic diastolic heart failure: Euvolemic-repeat Lasix 40 mg IV x1 today.  Dyslipidemia: Continue statin  GERD: Continue PPI  RA complicated by ILD: Supportive care-on steroids.  CellCept on hold-on Rituxan infusion every 4 months.   Consults  :  None  Procedures  :  None  ABG:    Component Value Date/Time   HCO3 29.3 (H) 01/22/2019 1530   O2SAT 82.6 01/22/2019 1530    Vent Settings: N/A  Condition - Stable  Family Communication  :  Daughter updated over the phone 12/17  Code Status :  Full Code  Diet :  Diet Order            Diet Heart Room service appropriate? Yes; Fluid consistency: Thin  Diet effective now                Disposition Plan  :  Remain hospitalized  Barriers to discharge: Hypoxia requiring O2 supplementation  Antimicorbials  :    Anti-infectives (From admission, onward)   Start     Dose/Rate Route Frequency Ordered Stop   01/27/19 1000  fluconazole (DIFLUCAN) tablet 100 mg     100 mg Oral Daily 01/26/19 1218     01/26/19 2000  cefTRIAXone (ROCEPHIN) 1 g in sodium chloride 0.9 % 100 mL IVPB     1 g 200 mL/hr over 30 Minutes Intravenous Every 24 hours 01/26/19 1609 01/29/19 1959   01/26/19 1400  fluconazole (DIFLUCAN) tablet 200 mg     200 mg Oral  Once 01/26/19 1218 01/26/19 1449   01/24/19 1300  ceFEPIme (MAXIPIME) 2 g in sodium chloride 0.9 % 100 mL IVPB  Status:  Discontinued     2 g 200 mL/hr over 30 Minutes Intravenous Every 8 hours 01/24/19 1141 01/26/19 1609   01/24/19 1115  azithromycin (ZITHROMAX) tablet 500 mg     500 mg Oral Daily 01/24/19 1111 01/24/19 1212   01/24/19 1000  remdesivir 100 mg in sodium chloride 0.9 % 100 mL IVPB     100 mg 200 mL/hr over 30 Minutes Intravenous Daily 01/23/19 2236 01/26/19 1900      Inpatient Medications  Scheduled Meds: . atorvastatin  10 mg Oral Daily  . docusate sodium  100 mg Oral Daily  . enoxaparin (LOVENOX) injection  0.5 mg/kg Subcutaneous Daily  . fluconazole  100 mg Oral Daily  . insulin aspart  0-15 Units Subcutaneous TID WC  . insulin aspart  4 Units Subcutaneous TID WC  . insulin glargine  20 Units Subcutaneous Daily  . Ipratropium-Albuterol  1 puff Inhalation Q6H  . methylPREDNISolone sodium succinate  20 mg Intravenous Q12H  . pantoprazole  40 mg Oral Daily  . pneumococcal 23  valent vaccine  0.5 mL Intramuscular Tomorrow-1000  . vitamin C  500 mg Oral Daily  . zinc sulfate  220 mg Oral Daily   Continuous Infusions: . cefTRIAXone (ROCEPHIN)  IV Stopped (01/26/19 2300)   PRN Meds:.acetaminophen, albuterol, chlorpheniramine-HYDROcodone, guaiFENesin-dextromethorphan, hydrOXYzine, magic mouthwash w/lidocaine,  ondansetron **OR** ondansetron (ZOFRAN) IV   Time Spent in minutes  25  See all Orders from today for further details   Oren Binet M.D on 01/27/2019 at 11:48 AM  To page go to [email or web address] - use universal password  [facility] -  Office  [phone]    Objective:   Vitals:   01/27/19 0545 01/27/19 0600 01/27/19 0615 01/27/19 0720  BP:      Pulse: 62 63 61 65  Resp:      Temp:      TempSrc:      SpO2: 100% 99% 98% 98%  Weight:      Height:        Wt Readings from Last 3 Encounters:  01/23/19 87.9 kg  01/22/19 90.3 kg  01/18/19 90.3 kg     Intake/Output Summary (Last 24 hours) at 01/27/2019 1148 Last data filed at 01/26/2019 2300 Gross per 24 hour  Intake 110.39 ml  Output 4 ml  Net 106.39 ml     Physical Exam Gen Exam:Alert awake-not in any distress HEENT:atraumatic, normocephalic Chest: B/L clear to auscultation anteriorly CVS:S1S2 regular Abdomen:soft non tender, non distended Extremities:no edema Neurology: Non focal Skin: no rash   Data Review:    CBC Recent Labs  Lab 01/22/19 1502 01/23/19 0656 01/24/19 0530 01/25/19 0530 01/26/19 0210 01/27/19 0335  WBC 16.6* 10.0 9.6 9.6 15.7* 15.2*  HGB 12.2 11.5* 10.8* 11.6* 12.0 12.1  HCT 36.4 35.2* 34.0* 36.4 37.2 37.7  PLT 448* 385 440* 483* 528* 457*  MCV 77.8* 79.3* 80.8 80.9 80.0 80.0  MCH 26.1 25.9* 25.7* 25.8* 25.8* 25.7*  MCHC 33.5 32.7 31.8 31.9 32.3 32.1  RDW 12.9 13.4 13.5 13.7 13.5 13.5  LYMPHSABS 2.0  --  1.3 1.3 1.3 1.7  MONOABS 1.0  --  0.7 0.6 0.8 0.8  EOSABS 0.0  --  0.0 0.0 0.0 0.0  BASOSABS 0.0  --  0.0 0.0 0.0 0.1    Chemistries  Recent Labs  Lab 01/22/19 1502 01/22/19 1705 01/23/19 0656 01/24/19 0530 01/25/19 0530 01/26/19 0210 01/27/19 0335  NA 134*  --  139 136 135 139 137  K 2.9*  --  3.5 3.7 3.6 3.8 4.0  CL 95*  --  103 101 99 103 100  CO2 25  --  26 24 26 23 23   GLUCOSE 143*  --  165* 238* 253* 198* 281*  BUN 19  --  15 15 21 21  24*    CREATININE 1.09*  --  0.64 0.71 0.74 0.75 0.73  CALCIUM 8.4*  --  8.0* 8.5* 8.6* 8.8* 8.7*  MG  --  2.3  --   --   --   --   --   AST 49*  --   --  37 27 28 22   ALT 32  --   --  30 34 39 38  ALKPHOS 52  --   --  56 56 63 65  BILITOT 0.7  --   --  0.3 0.3 0.3 0.7   ------------------------------------------------------------------------------------------------------------------ No results for input(s): CHOL, HDL, LDLCALC, TRIG, CHOLHDL, LDLDIRECT in the last 72 hours.  Lab Results  Component Value Date   HGBA1C 8.1 (H) 01/14/2019   ------------------------------------------------------------------------------------------------------------------  No results for input(s): TSH, T4TOTAL, T3FREE, THYROIDAB in the last 72 hours.  Invalid input(s): FREET3 ------------------------------------------------------------------------------------------------------------------ Recent Labs    01/26/19 0210 01/27/19 0335  FERRITIN 670* 500*    Coagulation profile Recent Labs  Lab 01/22/19 1705  INR 1.1    Recent Labs    01/26/19 0210 01/27/19 0335  DDIMER 2.05* 3.95*    Cardiac Enzymes No results for input(s): CKMB, TROPONINI, MYOGLOBIN in the last 168 hours.  Invalid input(s): CK ------------------------------------------------------------------------------------------------------------------    Component Value Date/Time   BNP 51.0 01/22/2019 1502    Micro Results Recent Results (from the past 240 hour(s))  Blood Culture (routine x 2)     Status: None   Collection Time: 01/22/19  3:01 PM   Specimen: BLOOD  Result Value Ref Range Status   Specimen Description BLOOD RIGHT ANTECUBITAL  Final   Special Requests   Final    BOTTLES DRAWN AEROBIC AND ANAEROBIC Blood Culture adequate volume   Culture   Final    NO GROWTH 5 DAYS Performed at [facility], [address] [place], [place] [address]    Report Status 01/27/2019 FINAL  Final  Blood Culture (routine x 2)      Status: None   Collection Time: 01/22/19  3:07 PM   Specimen: BLOOD  Result Value Ref Range Status   Specimen Description BLOOD LEFT ANTECUBITAL  Final   Special Requests   Final    BOTTLES DRAWN AEROBIC AND ANAEROBIC Blood Culture adequate volume   Culture   Final    NO GROWTH 5 DAYS Performed at [facility], [address] [place], [place] [address]    Report Status 01/27/2019 FINAL  Final  MRSA PCR Screening     Status: None   Collection Time: 01/22/19 11:44 PM   Specimen: Nasal Mucosa; Nasopharyngeal  Result Value Ref Range Status   MRSA by PCR NEGATIVE NEGATIVE Final    Comment:        The GeneXpert MRSA Assay (FDA approved for NASAL specimens only), is one component of a comprehensive MRSA colonization surveillance program. It is not intended to diagnose MRSA infection nor to guide or monitor treatment for MRSA infections. Performed at [facility], [address] [place], [place] [address]     Radiology Reports DG Chest 1 View  Result Date: 01/14/2019 CLINICAL DATA:  Shortness of breath EXAM: CHEST  1 VIEW COMPARISON:  04/24/2010 FINDINGS: Heart is borderline in size. Mild vascular congestion. Bibasilar opacities, likely atelectasis. Small effusions. No acute bony abnormality. IMPRESSION: Borderline heart size, vascular congestion. Bibasilar atelectasis with small effusions. Electronically Signed   By: Rolm Baptise M.D.   On: 01/14/2019 01:17   DG Chest 2 View  Result Date: 01/18/2019 CLINICAL DATA:  Chest pain. COVID-19 positive patient. Shortness of breath. EXAM: CHEST - 2 VIEW COMPARISON:  January 14, 2019 FINDINGS: Bibasilar infiltrates remain. The heart, hila, mediastinum, lungs, and pleura are otherwise unchanged and unremarkable. Chronic compression fracture in the upper lumbar spine. IMPRESSION: Persistent bibasilar infiltrates. Recommend follow-up to resolution. Persistent bibasilar infiltrates, likely due to the patient's COVID-19  status. No other acute abnormalities are seen. Electronically Signed   By: Dorise Bullion III M.D   On: 01/18/2019 16:11   CT ANGIO CHEST PE W OR WO CONTRAST  Result Date: 01/14/2019 CLINICAL DATA:  Progressive dyspnea and cough. Fever. Hypoxia. Positive COVID-19 test 10 days ago. EXAM: CT ANGIOGRAPHY CHEST WITH CONTRAST TECHNIQUE: Multidetector CT imaging of the chest was performed  using the standard protocol during bolus administration of intravenous contrast. Multiplanar CT image reconstructions and MIPs were obtained to evaluate the vascular anatomy. CONTRAST:  54mL OMNIPAQUE IOHEXOL 350 MG/ML SOLN COMPARISON:  One-view chest x-ray 01/14/2019 FINDINGS: Cardiovascular: The heart is mildly enlarged. Aorta and great vessel origins are within normal limits. Pulmonary artery opacification is excellent. No focal filling defects are present to suggest pulmonary embolus. Mediastinum/Nodes: No significant mediastinal, hilar, or axillary adenopathy is present. Lungs/Pleura: Patchy peripheral airspace opacities are present, predominantly in the lower lobes. Finding is consistent with COVID-19 pneumonia. Broad areas are normal without evidence for edema. No significant pleural effusion is present. Upper Abdomen: Diffuse fatty infiltration of the liver is noted. No focal lesions are present. Upper abdomen is otherwise within normal limits. Musculoskeletal: No chest wall abnormality. No acute or significant osseous findings. Review of the MIP images confirms the above findings. IMPRESSION: 1. No evidence for pulmonary embolus. 2. Patchy peripheral airspace opacities bilaterally compatible with COVID-19 pneumonia. 3. Cardiomegaly without failure. 4. Hepatic steatosis Electronically Signed   By: San Morelle M.D.   On: 01/14/2019 05:28   DG Chest Port 1 View  Result Date: 01/22/2019 CLINICAL DATA:  Increasing SOB that worsened today. Per ER notes: Per EMS pt had O2 sats of 70 when they arrived at her home and  she had SOB and recent positive covid test.Hx of CHF, COPD, DM EXAM: PORTABLE CHEST 1 VIEW COMPARISON:  01/18/2019 and earlier exams. FINDINGS: Bilateral airspace lung opacities, which predominate in the lower lungs, have increased from prior exam. No convincing pleural effusion.  No pneumothorax. Cardiac silhouette is normal in size. IMPRESSION: 1. Interval worsening of lung aeration with increased bilateral airspace lung opacities. Findings consistent with worsened COVID-19 pneumonia. Electronically Signed   By: Lajean Manes M.D.   On: 01/22/2019 15:31

## 2019-01-27 NOTE — Plan of Care (Signed)
SATURATION QUALIFICATIONS: (This note is used to comply with regulatory documentation for home oxygen)  Patient Saturations on Room Air at Rest = 100%  Patient Saturations on Room Air while Ambulating = 92%  Patient Saturations on 0 Liters of oxygen while Ambulating = 95%  Please briefly explain why patient needs home oxygen:   Pt has O2 sats and has progressed during her admission, she is down to 1L [place] sat high 90's. If she gets up suddenly or she feels anxious her sats drop to the high 80's and gets SOB. She is on room air now sat in the 90's, she expressed she would like to go home with oxygen and use it on a as needed basis. Everyday she gets better so we could reevaluate tomorrow before discharge to see if she really needs it.

## 2019-01-28 ENCOUNTER — Inpatient Hospital Stay (HOSPITAL_COMMUNITY): Payer: 59

## 2019-01-28 DIAGNOSIS — R7989 Other specified abnormal findings of blood chemistry: Secondary | ICD-10-CM

## 2019-01-28 LAB — GLUCOSE, CAPILLARY
Glucose-Capillary: 42 mg/dL — CL (ref 70–99)
Glucose-Capillary: 55 mg/dL — ABNORMAL LOW (ref 70–99)
Glucose-Capillary: 65 mg/dL — ABNORMAL LOW (ref 70–99)
Glucose-Capillary: 98 mg/dL (ref 70–99)
Glucose-Capillary: 175 mg/dL — ABNORMAL HIGH (ref 70–99)
Glucose-Capillary: 175 mg/dL — ABNORMAL HIGH (ref 70–99)
Glucose-Capillary: 407 mg/dL — ABNORMAL HIGH (ref 70–99)
Glucose-Capillary: 351 mg/dL — ABNORMAL HIGH (ref 70–99)

## 2019-01-28 LAB — COMPREHENSIVE METABOLIC PANEL
ALT: 44 U/L (ref 0–44)
ALT: 52 U/L — ABNORMAL HIGH (ref 0–44)
AST: 23 U/L (ref 15–41)
AST: 53 U/L — ABNORMAL HIGH (ref 15–41)
Albumin: 3.5 g/dL (ref 3.5–5.0)
Albumin: 3.2 g/dL — ABNORMAL LOW (ref 3.5–5.0)
Alkaline Phosphatase: 67 U/L (ref 38–126)
Alkaline Phosphatase: 61 U/L (ref 38–126)
Anion gap: 13 (ref 5–15)
Anion gap: 11 (ref 5–15)
BUN: 24 mg/dL — ABNORMAL HIGH (ref 8–23)
BUN: 26 mg/dL — ABNORMAL HIGH (ref 8–23)
CO2: 27 mmol/L (ref 22–32)
CO2: 21 mmol/L — ABNORMAL LOW (ref 22–32)
Calcium: 9.1 mg/dL (ref 8.9–10.3)
Calcium: 8.6 mg/dL — ABNORMAL LOW (ref 8.9–10.3)
Chloride: 102 mmol/L (ref 98–111)
Chloride: 99 mmol/L (ref 98–111)
Creatinine, Ser: 0.7 mg/dL (ref 0.44–1.00)
Creatinine, Ser: 0.9 mg/dL (ref 0.44–1.00)
GFR calc Af Amer: >60 mL/min (ref >60)
GFR calc Af Amer: >60 mL/min (ref >60)
GFR calc non Af Amer: >60 mL/min (ref >60)
GFR calc non Af Amer: >60 mL/min (ref >60)
Glucose, Bld: 88 mg/dL (ref 70–99)
Glucose, Bld: 346 mg/dL — ABNORMAL HIGH (ref 70–99)
Potassium: 3.4 mmol/L — ABNORMAL LOW (ref 3.5–5.1)
Potassium: 5.8 mmol/L — ABNORMAL HIGH (ref 3.5–5.1)
Sodium: 142 mmol/L (ref 135–145)
Sodium: 131 mmol/L — ABNORMAL LOW (ref 135–145)
Total Bilirubin: 0.6 mg/dL (ref 0.3–1.2)
Total Bilirubin: 1.6 mg/dL — ABNORMAL HIGH (ref 0.3–1.2)
Total Protein: 7.3 g/dL (ref 6.5–8.1)
Total Protein: 6.5 g/dL (ref 6.5–8.1)

## 2019-01-28 LAB — CBC WITH DIFFERENTIAL/PLATELET
Abs Immature Granulocytes: 0.8 10*3/uL — ABNORMAL HIGH (ref 0.00–0.07)
Basophils Absolute: 0.1 10*3/uL (ref 0.0–0.1)
Basophils Relative: 1 %
Eosinophils Absolute: 0 10*3/uL (ref 0.0–0.5)
Eosinophils Relative: 0 %
HCT: 40.7 % (ref 36.0–46.0)
Hemoglobin: 13.2 g/dL (ref 12.0–15.0)
Immature Granulocytes: 4 %
Lymphocytes Relative: 16 %
Lymphs Abs: 3.3 10*3/uL (ref 0.7–4.0)
MCH: 26.4 pg (ref 26.0–34.0)
MCHC: 32.4 g/dL (ref 30.0–36.0)
MCV: 81.4 fL (ref 80.0–100.0)
Monocytes Absolute: 2.1 10*3/uL — ABNORMAL HIGH (ref 0.1–1.0)
Monocytes Relative: 10 %
Neutro Abs: 14.5 10*3/uL — ABNORMAL HIGH (ref 1.7–7.7)
Neutrophils Relative %: 69 %
Platelets: 487 10*3/uL — ABNORMAL HIGH (ref 150–400)
RBC: 5 MIL/uL (ref 3.87–5.11)
RDW: 13.7 % (ref 11.5–15.5)
WBC: 20.9 10*3/uL — ABNORMAL HIGH (ref 4.0–10.5)
nRBC: 0.1 % (ref 0.0–0.2)

## 2019-01-28 LAB — D-DIMER, QUANTITATIVE
D-Dimer, Quant: 5.49 ug/mL-FEU — ABNORMAL HIGH (ref 0.00–0.50)
D-Dimer, Quant: 3.66 ug/mL-FEU — ABNORMAL HIGH (ref 0.00–0.50)

## 2019-01-28 LAB — C-REACTIVE PROTEIN: CRP: 1.7 mg/dL — ABNORMAL HIGH (ref <1.0)

## 2019-01-28 LAB — FERRITIN: Ferritin: 463 ng/mL — ABNORMAL HIGH (ref 11–307)

## 2019-01-28 MED ORDER — INSULIN GLARGINE 100 UNIT/ML ~~LOC~~ SOLN
18.0000 [IU] | Freq: Every day | SUBCUTANEOUS | Status: DC
  Administered 2019-01-29: 18 [IU] via SUBCUTANEOUS
  Filled 2019-01-28: qty 0.18

## 2019-01-28 MED ORDER — ENOXAPARIN SODIUM 60 MG/0.6ML ~~LOC~~ SOLN
0.5000 mg/kg | Freq: Two times a day (BID) | SUBCUTANEOUS | Status: DC
  Administered 2019-01-28 – 2019-01-29 (×2): 45 mg via SUBCUTANEOUS
  Filled 2019-01-28 (×2): qty 0.6

## 2019-01-28 MED ORDER — POTASSIUM CHLORIDE CRYS ER 20 MEQ PO TBCR
40.0000 meq | EXTENDED_RELEASE_TABLET | Freq: Once | ORAL | Status: AC
  Administered 2019-01-28: 13:00:00 40 meq via ORAL
  Filled 2019-01-28: qty 2

## 2019-01-28 MED ORDER — DEXTROSE 50 % IV SOLN
INTRAVENOUS | Status: AC
  Filled 2019-01-28: qty 50

## 2019-01-28 NOTE — Progress Notes (Signed)
Lower extremity venous has been completed.   Preliminary results in CV Proc.   Abram Sander 01/28/2019 3:41 PM

## 2019-01-28 NOTE — Plan of Care (Signed)
  Problem: Education: Goal: Knowledge of risk factors and measures for prevention of condition will improve Outcome: Progressing   Problem: Elimination: Goal: Will not experience complications related to bowel motility Outcome: Progressing   Problem: Activity: Goal: Risk for activity intolerance will decrease Outcome: Progressing   Problem: Clinical Measurements: Goal: Cardiovascular complication will be avoided Outcome: Progressing

## 2019-01-28 NOTE — Plan of Care (Signed)
  Problem: Education: Goal: Knowledge of risk factors and measures for prevention of condition will improve Outcome: Progressing   Problem: Coping: Goal: Psychosocial and spiritual needs will be supported Outcome: Progressing   Problem: Respiratory: Goal: Will maintain a patent airway Outcome: Progressing Goal: Complications related to the disease process, condition or treatment will be avoided or minimized Outcome: Progressing   Problem: Education: Goal: Knowledge of General Education information will improve Description: Including pain rating scale, medication(s)/side effects and non-pharmacologic comfort measures Outcome: Progressing   Problem: Health Behavior/Discharge Planning: Goal: Ability to manage health-related needs will improve Outcome: Progressing   Problem: Safety: Goal: Ability to remain free from injury will improve Outcome: Progressing

## 2019-01-28 NOTE — Progress Notes (Signed)
Inpatient Diabetes Program Recommendations  AACE/ADA: New Consensus Statement on Inpatient Glycemic Control (2015)  Target Ranges:  Prepandial:   less than 140 mg/dL      Peak postprandial:   less than 180 mg/dL (1-2 hours)      Critically ill patients:  140 - 180 mg/dL   Lab Results  Component Value Date   GLUCAP 175 (H) 01/28/2019   HGBA1C 8.1 (H) 01/14/2019    Review of Glycemic Control Results for JELESIA, GODDU (MRN [id number]) as of 01/28/2019 12:37  Ref. Range 01/28/2019 08:18 01/28/2019 08:25 01/28/2019 08:47 01/28/2019 10:05 01/28/2019 12:17  Glucose-Capillary Latest Ref Range: 70 - 99 mg/dL 42 (LL) 65 (L) 98 175 (H) 175 (H)   Diabetes history: Type 2 DM Outpatient Diabetes medications: Metformin 1000 mg QAM Current orders for Inpatient glycemic control: Novolog 0-15 units TID, Lantus 25 units QD, Novolog 6 units TID Prednisone 40 mg QAM  Inpatient Diabetes Program Recommendations:    Noted hypoglycemic event this am of 42 mg/dL. This was following a one time order for Novolog 20 units yesterday for a CBG of 458 mg/dL. Additionally, Lantus was increased from 20 units to 25 units this AM and steroids were tapered to oral Prednisone.  Suspect insulin needs will decrease.   Would recommend decreasing Lantus back to 20 units QD.   Thanks, Bronson Curb, MSN, RNC-OB Diabetes Coordinator [phone] (8a-5p)

## 2019-01-28 NOTE — Progress Notes (Addendum)
PROGRESS NOTE                                                                                                                                                                                                             Patient Demographics:    Amber Aguilar, is a 63 y.o. female, DOB - Aug 06, 1955, [id number]  Outpatient Primary MD for the patient is System, Pcp Not In   Admit date - 01/23/2019   LOS - 5  No chief complaint on file.      Brief Narrative: Patient is a 63 y.o. 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  Patient was subsequently transferred to [facility] on 12/15 for further evaluation and treatment.  See below for further details.  Significant events: 11/24>>covid +  12/4>>12/5: admitted and then discharged from [facility] 12/12>>Readmitted to [facility] 12/15>>transfer to [facility]   Subjective:    Khrystyn Merriott is now on room air.  She has no shortness of breath or chest pain.   Assessment  & Plan :  Acute hypoxemic respiratory failure due to severe acute respiratory syndrome coronavirus 2 (SARS-CoV-2) disease and concurrent bacterial pneumonia:  Significantly improved-on room air.  Has completed remdesivir on 12/16, will complete remdesivir 12/18.  Remains on steroids-that has been tapered down.  D-dimer however increasing-awaiting Doppler of the lower extremities.    Fever: afebrile  O2 requirements:  SpO2: 97 % O2 Flow Rate (L/min): 1 L/min   COVID-19 Labs: Recent Labs    01/26/19 0210 01/27/19 0335 01/28/19 0050  DDIMER 2.05* 3.95* 5.49*  FERRITIN 670* 500* 463*  CRP 5.0* 2.8* 1.7*       Component Value Date/Time   BNP 51.0 01/22/2019 1502    Recent Labs  Lab  01/22/19 1502 01/24/19 0557 01/25/19 0530 01/26/19 0210  PROCALCITON 32.96 33.04 16.46 7.76    Lab Results  Component Value Date   SARSCOV2NAA POSITIVE (A) 01/14/2019     COVID-19 medications: Remdesivir: 12/12>> 12/16 Steroids: 12/12  Other medications: Lasix: Euvolemic-hold diuretics today. Vancomycin: 12/12>> 12/13 Zithromax: 12/12>> 12/14 Cefepime: 12/12>>12/16 Rocephin: 12/16-planned stop date 12/18  Prone/Incentive Spirometry: encouraged incentive spirometry use 3-4/hour.  DVT Prophylaxis  :  Lovenox@twice  daily dosing-given elevated D-dimer more than 5.  Oral candidiasis: Continue Diflucan  Diabetes with hyperglycemia related to steroid administration and now with hypoglycemia (A1c 8.1 on 12/4):  Hypoglycemic episode this a.m.-steroid dosage has been tapered down significantly-decrease Lantus to 18 units, stop scheduled premeal NovoLog.  Continue with SSI.  Follow and adjust.  CBG (last 3)  Recent Labs    01/28/19 0847 01/28/19 1005 01/28/19 1217  GLUCAP 98 175* 175*    COPD: No wheezing-continue with as needed bronchodilators.  Chronic diastolic heart failure: Euvolemic.  Dyslipidemia: Continue statin  GERD: Continue PPI  RA complicated by ILD: Supportive care-on steroids.  CellCept on hold-on Rituxan infusion every 4 months.   Consults  :  None  Procedures  :  None  ABG:    Component Value Date/Time   HCO3 29.3 (H) 01/22/2019 1530   O2SAT 82.6 01/22/2019 1530    Vent Settings: N/A  Condition - Stable  Family Communication  :  Daughter updated over the phone 12/18  Code Status :  Full Code  Diet :  Diet Order            Diet Heart Room service appropriate? Yes; Fluid consistency: Thin  Diet effective now               Disposition Plan  :  Remain hospitalized  Barriers to discharge: Hypoxia requiring O2 supplementation  Antimicorbials  :    Anti-infectives (From admission, onward)   Start     Dose/Rate Route Frequency  Ordered Stop   01/27/19 1000  fluconazole (DIFLUCAN) tablet 100 mg     100 mg Oral Daily 01/26/19 1218     01/26/19 2000  cefTRIAXone (ROCEPHIN) 1 g in sodium chloride 0.9 % 100 mL IVPB     1 g 200 mL/hr over 30 Minutes Intravenous Every 24 hours 01/26/19 1609 01/29/19 1959   01/26/19 1400  fluconazole (DIFLUCAN) tablet 200 mg     200 mg Oral  Once 01/26/19 1218 01/26/19 1449   01/24/19 1300  ceFEPIme (MAXIPIME) 2 g in sodium chloride 0.9 % 100 mL IVPB  Status:  Discontinued     2 g 200 mL/hr over 30 Minutes Intravenous Every 8 hours 01/24/19 1141 01/26/19 1609   01/24/19 1115  azithromycin (ZITHROMAX) tablet 500 mg     500 mg Oral Daily 01/24/19 1111 01/24/19 1212   01/24/19 1000  remdesivir 100 mg in sodium chloride 0.9 % 100 mL IVPB     100 mg 200 mL/hr over 30 Minutes Intravenous Daily 01/23/19 2236 01/26/19 1900      Inpatient Medications  Scheduled Meds: . atorvastatin  10 mg Oral Daily  . dextrose      . docusate sodium  100 mg Oral Daily  . enoxaparin (LOVENOX) injection  0.5 mg/kg Subcutaneous BID  . fluconazole  100 mg Oral Daily  . insulin aspart  0-15 Units Subcutaneous TID WC  . insulin aspart  6 Units Subcutaneous TID WC  . insulin glargine  25 Units Subcutaneous Daily  . Ipratropium-Albuterol  1 puff Inhalation Q6H  . pantoprazole  40 mg Oral Daily  . pneumococcal 23 valent vaccine  0.5 mL Intramuscular Tomorrow-1000  . predniSONE  40 mg Oral Q breakfast  . vitamin C  500 mg Oral Daily  . zinc sulfate  220 mg Oral Daily   Continuous Infusions: . cefTRIAXone (ROCEPHIN)  IV 1 g (01/27/19 2021)   PRN Meds:.acetaminophen, albuterol, chlorpheniramine-HYDROcodone, guaiFENesin-dextromethorphan, hydrOXYzine, magic mouthwash w/lidocaine, ondansetron **OR**  ondansetron (ZOFRAN) IV   Time Spent in minutes  25  See all Orders from today for further details   Oren Binet M.D on 01/28/2019 at 4:46 PM  To page go to [email or web address] - use universal password  [facility] -  Office  [phone]    Objective:   Vitals:   01/27/19 1939 01/28/19 0318 01/28/19 0800 01/28/19 1200  BP: (!) 110/59 115/68 (!) 120/57 (!) 148/120  Pulse: 84 71 94 84  Resp: 18  16 20   Temp: 97.6 F (36.4 C) 97.9 F (36.6 C) 98.6 F (37 C) 97.8 F (36.6 C)  TempSrc: Oral Oral Axillary Axillary  SpO2: 97% 95% 98% 97%  Weight:      Height:        Wt Readings from Last 3 Encounters:  01/23/19 87.9 kg  01/22/19 90.3 kg  01/18/19 90.3 kg     Intake/Output Summary (Last 24 hours) at 01/28/2019 1646 Last data filed at 01/28/2019 1600 Gross per 24 hour  Intake 805 ml  Output --  Net 805 ml     Physical Exam Gen Exam:Alert awake-not in any distress HEENT:atraumatic, normocephalic Chest: B/L clear to auscultation anteriorly CVS:S1S2 regular Abdomen:soft non tender, non distended Extremities:no edema Neurology: Non focal Skin: no rash   Data Review:    CBC Recent Labs  Lab 01/24/19 0530 01/25/19 0530 01/26/19 0210 01/27/19 0335 01/28/19 0050  WBC 9.6 9.6 15.7* 15.2* 20.9*  HGB 10.8* 11.6* 12.0 12.1 13.2  HCT 34.0* 36.4 37.2 37.7 40.7  PLT 440* 483* 528* 457* 487*  MCV 80.8 80.9 80.0 80.0 81.4  MCH 25.7* 25.8* 25.8* 25.7* 26.4  MCHC 31.8 31.9 32.3 32.1 32.4  RDW 13.5 13.7 13.5 13.5 13.7  LYMPHSABS 1.3 1.3 1.3 1.7 3.3  MONOABS 0.7 0.6 0.8 0.8 2.1*  EOSABS 0.0 0.0 0.0 0.0 0.0  BASOSABS 0.0 0.0 0.0 0.1 0.1    Chemistries  Recent Labs  Lab 01/22/19 1705 01/24/19 0530 01/25/19 0530 01/26/19 0210 01/27/19 0335 01/28/19 0050  NA  --  136 135 139 137 142  K  --  3.7 3.6 3.8 4.0 3.4*  CL  --  101 99 103 100 102  CO2  --  24 26 23 23 27   GLUCOSE  --  238* 253* 198* 281* 88  BUN  --  15 21 21  24* 24*  CREATININE  --  0.71 0.74 0.75 0.73 0.70  CALCIUM  --  8.5* 8.6* 8.8* 8.7* 9.1  MG 2.3  --   --   --   --   --   AST  --  37 27 28 22 23   ALT  --  30 34 39 38 44  ALKPHOS  --  56 56 63 65 67  BILITOT  --  0.3 0.3 0.3 0.7 0.6    ------------------------------------------------------------------------------------------------------------------ No results for input(s): CHOL, HDL, LDLCALC, TRIG, CHOLHDL, LDLDIRECT in the last 72 hours.  Lab Results  Component Value Date   HGBA1C 8.1 (H) 01/14/2019   ------------------------------------------------------------------------------------------------------------------ No results for input(s): TSH, T4TOTAL, T3FREE, THYROIDAB in the last 72 hours.  Invalid input(s): FREET3 ------------------------------------------------------------------------------------------------------------------ Recent Labs    01/27/19 0335 01/28/19 0050  FERRITIN 500* 463*    Coagulation profile Recent Labs  Lab 01/22/19 1705  INR 1.1    Recent Labs    01/27/19 0335 01/28/19 0050  DDIMER 3.95* 5.49*    Cardiac Enzymes No results for input(s): CKMB, TROPONINI, MYOGLOBIN in the last 168 hours.  Invalid input(s): CK ------------------------------------------------------------------------------------------------------------------  Component Value Date/Time   BNP 51.0 01/22/2019 1502    Micro Results Recent Results (from the past 240 hour(s))  Blood Culture (routine x 2)     Status: None   Collection Time: 01/22/19  3:01 PM   Specimen: BLOOD  Result Value Ref Range Status   Specimen Description BLOOD RIGHT ANTECUBITAL  Final   Special Requests   Final    BOTTLES DRAWN AEROBIC AND ANAEROBIC Blood Culture adequate volume   Culture   Final    NO GROWTH 5 DAYS Performed at [facility], [address] [place], [place] [address]    Report Status 01/27/2019 FINAL  Final  Blood Culture (routine x 2)     Status: None   Collection Time: 01/22/19  3:07 PM   Specimen: BLOOD  Result Value Ref Range Status   Specimen Description BLOOD LEFT ANTECUBITAL  Final   Special Requests   Final    BOTTLES DRAWN AEROBIC AND ANAEROBIC Blood Culture adequate volume   Culture    Final    NO GROWTH 5 DAYS Performed at [facility], [address] [place], [place] [address]    Report Status 01/27/2019 FINAL  Final  MRSA PCR Screening     Status: None   Collection Time: 01/22/19 11:44 PM   Specimen: Nasal Mucosa; Nasopharyngeal  Result Value Ref Range Status   MRSA by PCR NEGATIVE NEGATIVE Final    Comment:        The GeneXpert MRSA Assay (FDA approved for NASAL specimens only), is one component of a comprehensive MRSA colonization surveillance program. It is not intended to diagnose MRSA infection nor to guide or monitor treatment for MRSA infections. Performed at [facility], [address] [place],  [address]     Radiology Reports DG Chest 1 View  Result Date: 01/14/2019 CLINICAL DATA:  Shortness of breath EXAM: CHEST  1 VIEW COMPARISON:  04/24/2010 FINDINGS: Heart is borderline in size. Mild vascular congestion. Bibasilar opacities, likely atelectasis. Small effusions. No acute bony abnormality. IMPRESSION: Borderline heart size, vascular congestion. Bibasilar atelectasis with small effusions. Electronically Signed   By: Rolm Baptise M.D.   On: 01/14/2019 01:17   DG Chest 2 View  Result Date: 01/18/2019 CLINICAL DATA:  Chest pain. COVID-19 positive patient. Shortness of breath. EXAM: CHEST - 2 VIEW COMPARISON:  January 14, 2019 FINDINGS: Bibasilar infiltrates remain. The heart, hila, mediastinum, lungs, and pleura are otherwise unchanged and unremarkable. Chronic compression fracture in the upper lumbar spine. IMPRESSION: Persistent bibasilar infiltrates. Recommend follow-up to resolution. Persistent bibasilar infiltrates, likely due to the patient's COVID-19 status. No other acute abnormalities are seen. Electronically Signed   By: Dorise Bullion III M.D   On: 01/18/2019 16:11   CT ANGIO CHEST PE W OR WO CONTRAST  Result Date: 01/14/2019 CLINICAL DATA:  Progressive dyspnea and cough. Fever. Hypoxia. Positive COVID-19 test  10 days ago. EXAM: CT ANGIOGRAPHY CHEST WITH CONTRAST TECHNIQUE: Multidetector CT imaging of the chest was performed using the standard protocol during bolus administration of intravenous contrast. Multiplanar CT image reconstructions and MIPs were obtained to evaluate the vascular anatomy. CONTRAST:  64mL OMNIPAQUE IOHEXOL 350 MG/ML SOLN COMPARISON:  One-view chest x-ray 01/14/2019 FINDINGS: Cardiovascular: The heart is mildly enlarged. Aorta and great vessel origins are within normal limits. Pulmonary artery opacification is excellent. No focal filling defects are present to suggest pulmonary embolus. Mediastinum/Nodes: No significant mediastinal, hilar, or axillary adenopathy is present. Lungs/Pleura: Patchy peripheral airspace opacities are present, predominantly  in the lower lobes. Finding is consistent with COVID-19 pneumonia. Broad areas are normal without evidence for edema. No significant pleural effusion is present. Upper Abdomen: Diffuse fatty infiltration of the liver is noted. No focal lesions are present. Upper abdomen is otherwise within normal limits. Musculoskeletal: No chest wall abnormality. No acute or significant osseous findings. Review of the MIP images confirms the above findings. IMPRESSION: 1. No evidence for pulmonary embolus. 2. Patchy peripheral airspace opacities bilaterally compatible with COVID-19 pneumonia. 3. Cardiomegaly without failure. 4. Hepatic steatosis Electronically Signed   By: San Morelle M.D.   On: 01/14/2019 05:28   DG Chest Port 1 View  Result Date: 01/22/2019 CLINICAL DATA:  Increasing SOB that worsened today. Per ER notes: Per EMS pt had O2 sats of 70 when they arrived at her home and she had SOB and recent positive covid test.Hx of CHF, COPD, DM EXAM: PORTABLE CHEST 1 VIEW COMPARISON:  01/18/2019 and earlier exams. FINDINGS: Bilateral airspace lung opacities, which predominate in the lower lungs, have increased from prior exam. No convincing pleural  effusion.  No pneumothorax. Cardiac silhouette is normal in size. IMPRESSION: 1. Interval worsening of lung aeration with increased bilateral airspace lung opacities. Findings consistent with worsened COVID-19 pneumonia. Electronically Signed   By: Lajean Manes M.D.   On: 01/22/2019 15:31   VAS [place] LOWER EXTREMITY VENOUS (DVT)  Result Date: 01/28/2019  Lower Venous Study Indications: Positive d dimer.  Comparison Study: no prior Performing Technologist: Abram Sander RVS  Examination Guidelines: A complete evaluation includes B-mode imaging, spectral Doppler, color Doppler, and power Doppler as needed of all accessible portions of each vessel. Bilateral testing is considered an integral part of a complete examination. Limited examinations for reoccurring indications may be performed as noted.  +---------+---------------+---------+-----------+----------+--------------+ RIGHT    CompressibilityPhasicitySpontaneityPropertiesThrombus Aging +---------+---------------+---------+-----------+----------+--------------+ CFV      Full           Yes      Yes                                 +---------+---------------+---------+-----------+----------+--------------+ SFJ      Full                                                        +---------+---------------+---------+-----------+----------+--------------+ FV Prox  Full                                                        +---------+---------------+---------+-----------+----------+--------------+ FV Mid   Full                                                        +---------+---------------+---------+-----------+----------+--------------+ FV DistalFull                                                        +---------+---------------+---------+-----------+----------+--------------+  PFV      Full                                                        +---------+---------------+---------+-----------+----------+--------------+  POP      Full           Yes      Yes                                 +---------+---------------+---------+-----------+----------+--------------+ PTV      Full                                                        +---------+---------------+---------+-----------+----------+--------------+ PERO     Full                                                        +---------+---------------+---------+-----------+----------+--------------+   +---------+---------------+---------+-----------+----------+--------------+ LEFT     CompressibilityPhasicitySpontaneityPropertiesThrombus Aging +---------+---------------+---------+-----------+----------+--------------+ CFV      Full           Yes      Yes                                 +---------+---------------+---------+-----------+----------+--------------+ SFJ      Full                                                        +---------+---------------+---------+-----------+----------+--------------+ FV Prox  Full                                                        +---------+---------------+---------+-----------+----------+--------------+ FV Mid   Full                                                        +---------+---------------+---------+-----------+----------+--------------+ FV DistalFull                                                        +---------+---------------+---------+-----------+----------+--------------+ PFV      Full                                                        +---------+---------------+---------+-----------+----------+--------------+  POP      Full           Yes      Yes                                 +---------+---------------+---------+-----------+----------+--------------+ PTV      Full                                                        +---------+---------------+---------+-----------+----------+--------------+ PERO     Full                                                         +---------+---------------+---------+-----------+----------+--------------+     Summary: Right: There is no evidence of deep vein thrombosis in the lower extremity. No cystic structure found in the popliteal fossa. Left: There is no evidence of deep vein thrombosis in the lower extremity. No cystic structure found in the popliteal fossa.  *See table(s) above for measurements and observations.    Preliminary

## 2019-01-29 DIAGNOSIS — B370 Candidal stomatitis: Secondary | ICD-10-CM

## 2019-01-29 LAB — BASIC METABOLIC PANEL
Anion gap: 9 (ref 5–15)
BUN: 21 mg/dL (ref 8–23)
CO2: 25 mmol/L (ref 22–32)
Calcium: 8.6 mg/dL — ABNORMAL LOW (ref 8.9–10.3)
Chloride: 104 mmol/L (ref 98–111)
Creatinine, Ser: 0.73 mg/dL (ref 0.44–1.00)
GFR calc Af Amer: >60 mL/min (ref >60)
GFR calc non Af Amer: >60 mL/min (ref >60)
Glucose, Bld: 192 mg/dL — ABNORMAL HIGH (ref 70–99)
Potassium: 4.3 mmol/L (ref 3.5–5.1)
Sodium: 138 mmol/L (ref 135–145)

## 2019-01-29 LAB — GLUCOSE, CAPILLARY
Glucose-Capillary: 85 mg/dL (ref 70–99)
Glucose-Capillary: 62 mg/dL — ABNORMAL LOW (ref 70–99)

## 2019-01-29 MED ORDER — SODIUM ZIRCONIUM CYCLOSILICATE 10 G PO PACK
10.0000 g | PACK | Freq: Every day | ORAL | Status: AC
  Administered 2019-01-29: 10 g via ORAL
  Filled 2019-01-29: qty 1

## 2019-01-29 MED ORDER — INSULIN ASPART 100 UNIT/ML IV SOLN
6.0000 [IU] | Freq: Once | INTRAVENOUS | Status: AC
  Administered 2019-01-29: 09:00:00 6 [IU] via INTRAVENOUS

## 2019-01-29 MED ORDER — INSULIN ASPART 100 UNIT/ML ~~LOC~~ SOLN: 4.0000 [IU] | Freq: Three times a day (TID) | SUBCUTANEOUS | Status: DC

## 2019-01-29 MED ORDER — PREDNISONE 10 MG PO TABS: ORAL_TABLET | ORAL | 0 refills | Status: DC

## 2019-01-29 MED ORDER — ASPIRIN 325 MG PO TABS: 325.0000 mg | ORAL_TABLET | Freq: Every day | ORAL | 0 refills | Status: DC

## 2019-01-29 MED ORDER — HYDROCOD POLST-CPM POLST ER 10-8 MG/5ML PO SUER: 5.0000 mL | Freq: Two times a day (BID) | ORAL | 0 refills | Status: DC

## 2019-01-29 MED ORDER — PREDNISONE 20 MG PO TABS
30.0000 mg | ORAL_TABLET | Freq: Every day | ORAL | Status: DC
  Administered 2019-01-29: 30 mg via ORAL
  Filled 2019-01-29: qty 1

## 2019-01-29 MED ORDER — DEXTROSE 50 % IV SOLN
25.0000 mL | Freq: Once | INTRAVENOUS | Status: DC
  Filled 2019-01-29: qty 50

## 2019-01-29 MED ORDER — FLUCONAZOLE 100 MG PO TABS: 100.0000 mg | ORAL_TABLET | Freq: Every day | ORAL | 0 refills | Status: DC

## 2019-01-29 MED ORDER — MYCOPHENOLATE MOFETIL 500 MG PO TABS: 500.0000 mg | ORAL_TABLET | Freq: Two times a day (BID) | ORAL | Status: AC

## 2019-01-29 NOTE — Discharge Summary (Addendum)
PATIENT DETAILS Name: Amber Aguilar Age: 63 y.o. Sex: female Date of Birth: 1955/02/16 MRN: [id number]. Admitting Physician: Peyton Bottoms, MD KN:[id number], Pcp Not In  Admit Date: 01/23/2019 Discharge date: 01/29/2019  Recommendations for Outpatient Follow-up:  1. Follow up with PCP in 1-2 weeks 2. Please obtain CMP/CBC in one week 3. Repeat Chest Xray in 4-6 week  Admitted From:  Home  Disposition: Bedias: No  Equipment/Devices: None  Discharge Condition: Stable  CODE STATUS: FULL CODE  Diet recommendation:  Diet Order            Diet - low sodium heart healthy        Diet Carb Modified        Diet Heart Room service appropriate? Yes; Fluid consistency: Thin  Diet effective now               Brief Summary: Patient is a 63 y.o. 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  Patient was subsequently transferred to [facility] on 12/15 for further evaluation and treatment.  See below for further details.  Significant events: 11/24>>covid +  12/4>>12/5: admitted and then discharged from [facility] 12/12>>Readmitted to [facility] 12/15>>transfer to [facility] Course: Acute hypoxemic respiratory failure due to severe acute respiratory syndrome coronavirus 2 (SARS-CoV-2) diseaseand concurrent bacterial pneumonia: Significantly improved-on room air.  Ambulated with RN today-does not require home O2.  Treated with steroids, remdesivir and empiric antibiotics.  Has completed a course of remdesivir-we will continue on tapering steroids for a few more days.  COVID-19 Labs:  Recent Labs    01/27/19 0335 01/28/19 0050 01/28/19 1757  DDIMER 3.95* 5.49* 3.66*  FERRITIN 500* 463*  --   CRP 2.8* 1.7*  --     Lab  Results  Component Value Date   SARSCOV2NAA POSITIVE (A) 01/14/2019     COVID-19 medications: Remdesivir: 12/12>> 12/16 Steroids: 12/12  Other medications: Lasix: Euvolemic-hold diuretics today. Vancomycin: 12/12>>12/13 Zithromax: 12/12>>12/14 Cefepime: 12/12>>12/16 Rocephin: 12/16>>12/18  Oral candidiasis: Improved-continue Diflucan for a few more days on discharge.  Diabetes with hyperglycemia related to steroid administration and now with hypoglycemia (A1c 8.1 on 12/4):  Managed with Lantus-Premeal NovoLog.  Did have a few episodes of hypoglycemia.  CBGs have now stabilized.  Steroids being tapered down very rapidly suspect should be okay to resume her usual home regimen on discharge.  COPD: No wheezing-continue with as needed bronchodilators.  Chronic diastolic heart failure: Euvolemic.  Dyslipidemia:Continue statin  GERD:Continue PPI  RA complicated by ILD: Supportive care-on steroids. CellCept on hold-if she continues to do well-suspect should be okay to resume CellCept in 1 week or so..  On Rituxan infusion every 4 months.   Obesity: Estimated body mass index is 35.44 kg/m as calculated from the following:   Height as of this encounter: 5\' 2"  (1.575 m).   Weight as of this encounter: 87.9 kg.    Procedures/Studies: None  Discharge Diagnoses:  Active Problems:   Acute hypoxemic respiratory failure due to severe acute respiratory syndrome coronavirus 2 (SARS-CoV-2) disease [facility])   Discharge Instructions:    Person Under Monitoring Name: Amber Aguilar  Location: [place] [id number]   Infection Prevention Recommendations for Individuals Confirmed to have, or Being Evaluated for, 2019 Novel Coronavirus (COVID-19) Infection Who  Receive Care at Home  Individuals who are confirmed to have, or are being evaluated for, COVID-19 should follow the prevention steps below until a healthcare provider or local or state health  department says they can return to normal activities.  Stay home except to get medical care You should restrict activities outside your home, except for getting medical care. Do not go to work, school, or public areas, and do not use public transportation or taxis.  Call ahead before visiting your doctor Before your medical appointment, call the healthcare provider and tell them that you have, or are being evaluated for, COVID-19 infection. This will help the healthcare provider's office take steps to keep other people from getting infected. Ask your healthcare provider to call the local or state health department.  Monitor your symptoms Seek prompt medical attention if your illness is worsening (e.g., difficulty breathing). Before going to your medical appointment, call the healthcare provider and tell them that you have, or are being evaluated for, COVID-19 infection. Ask your healthcare provider to call the local or state health department.  Wear a facemask You should wear a facemask that covers your nose and mouth when you are in the same room with other people and when you visit a healthcare provider. People who live with or visit you should also wear a facemask while they are in the same room with you.  Separate yourself from other people in your home As much as possible, you should stay in a different room from other people in your home. Also, you should use a separate bathroom, if available.  Avoid sharing household items You should not share dishes, drinking glasses, cups, eating utensils, towels, bedding, or other items with other people in your home. After using these items, you should wash them thoroughly with soap and water.  Cover your coughs and sneezes Cover your mouth and nose with a tissue when you cough or sneeze, or you can cough or sneeze into your sleeve. Throw used tissues in a lined trash can, and immediately wash your hands with soap and water for at least 20  seconds or use an alcohol-based hand rub.  Wash your [facility] your hands often and thoroughly with soap and water for at least 20 seconds. You can use an alcohol-based hand sanitizer if soap and water are not available and if your hands are not visibly dirty. Avoid touching your eyes, nose, and mouth with unwashed hands.   Prevention Steps for Caregivers and Household Members of Individuals Confirmed to have, or Being Evaluated for, COVID-19 Infection Being Cared for in the Home  If you live with, or provide care at home for, a person confirmed to have, or being evaluated for, COVID-19 infection please follow these guidelines to prevent infection:  Follow healthcare provider's instructions Make sure that you understand and can help the patient follow any healthcare provider instructions for all care.  Provide for the patient's basic needs You should help the patient with basic needs in the home and provide support for getting groceries, prescriptions, and other personal needs.  Monitor the patient's symptoms If they are getting sicker, call his or her medical provider and tell them that the patient has, or is being evaluated for, COVID-19 infection. This will help the healthcare provider's office take steps to keep other people from getting infected. Ask the healthcare provider to call the local or state health department.  Limit the number of people who have contact with the patient  If possible,  have only one caregiver for the patient.  Other household members should stay in another home or place of residence. If this is not possible, they should stay  in another room, or be separated from the patient as much as possible. Use a separate bathroom, if available.  Restrict visitors who do not have an essential need to be in the home.  Keep older adults, very young children, and other sick people away from the patient Keep older adults, very young children, and those who have  compromised immune systems or chronic health conditions away from the patient. This includes people with chronic heart, lung, or kidney conditions, diabetes, and cancer.  Ensure good ventilation Make sure that shared spaces in the home have good air flow, such as from an air conditioner or an opened window, weather permitting.  Wash your hands often  Wash your hands often and thoroughly with soap and water for at least 20 seconds. You can use an alcohol based hand sanitizer if soap and water are not available and if your hands are not visibly dirty.  Avoid touching your eyes, nose, and mouth with unwashed hands.  Use disposable paper towels to dry your hands. If not available, use dedicated cloth towels and replace them when they become wet.  Wear a facemask and gloves  Wear a disposable facemask at all times in the room and gloves when you touch or have contact with the patient's blood, body fluids, and/or secretions or excretions, such as sweat, saliva, sputum, nasal mucus, vomit, urine, or feces.  Ensure the mask fits over your nose and mouth tightly, and do not touch it during use.  Throw out disposable facemasks and gloves after using them. Do not reuse.  Wash your hands immediately after removing your facemask and gloves.  If your personal clothing becomes contaminated, carefully remove clothing and launder. Wash your hands after handling contaminated clothing.  Place all used disposable facemasks, gloves, and other waste in a lined container before disposing them with other household waste.  Remove gloves and wash your hands immediately after handling these items.  Do not share dishes, glasses, or other household items with the patient  Avoid sharing household items. You should not share dishes, drinking glasses, cups, eating utensils, towels, bedding, or other items with a patient who is confirmed to have, or being evaluated for, COVID-19 infection.  After the person uses  these items, you should wash them thoroughly with soap and water.  Wash laundry thoroughly  Immediately remove and wash clothes or bedding that have blood, body fluids, and/or secretions or excretions, such as sweat, saliva, sputum, nasal mucus, vomit, urine, or feces, on them.  Wear gloves when handling laundry from the patient.  Read and follow directions on labels of laundry or clothing items and detergent. In general, wash and dry with the warmest temperatures recommended on the label.  Clean all areas the individual has used often  Clean all touchable surfaces, such as counters, tabletops, doorknobs, bathroom fixtures, toilets, phones, keyboards, tablets, and bedside tables, every day. Also, clean any surfaces that may have blood, body fluids, and/or secretions or excretions on them.  Wear gloves when cleaning surfaces the patient has come in contact with.  Use a diluted bleach solution (e.g., dilute bleach with 1 part bleach and 10 parts water) or a household disinfectant with a label that says EPA-registered for coronaviruses. To make a bleach solution at home, add 1 tablespoon of bleach to 1 quart (4 cups) of water.  For a larger supply, add  cup of bleach to 1 gallon (16 cups) of water.  Read labels of cleaning products and follow recommendations provided on product labels. Labels contain instructions for safe and effective use of the cleaning product including precautions you should take when applying the product, such as wearing gloves or eye protection and making sure you have good ventilation during use of the product.  Remove gloves and wash hands immediately after cleaning.  Monitor yourself for signs and symptoms of illness Caregivers and household members are considered close contacts, should monitor their health, and will be asked to limit movement outside of the home to the extent possible. Follow the monitoring steps for close contacts listed on the symptom monitoring  form.   ? If you have additional questions, contact your local health department or call the epidemiologist on call at [phone] (available 24/7). ? This guidance is subject to change. For the most up-to-date guidance from CDC, please refer to their website: YouBlogs.pl    Activity:  As tolerated   Discharge Instructions    Call MD for:  difficulty breathing, headache or visual disturbances   Complete by: As directed    Call MD for:  persistant dizziness or light-headedness   Complete by: As directed    Call MD for:  persistant nausea and vomiting   Complete by: As directed    Diet - low sodium heart healthy   Complete by: As directed    Diet Carb Modified   Complete by: As directed    Increase activity slowly   Complete by: As directed      Allergies as of 01/29/2019      Reactions   Benzalkonium Chloride Other (See Comments)   Neosporin [neomycin-bacitracin Zn-polymyx]       Medication List    STOP taking these medications   azithromycin 500 mg in sodium chloride 0.9 % 250 mL   ceFEPIme 2 g in sodium chloride 0.9 % 100 mL   heparin 25000-0.45 UT/250ML-% infusion   methylPREDNISolone sodium succinate 40 mg/mL injection Commonly known as: SOLU-MEDROL   ondansetron 4 MG disintegrating tablet Commonly known as: Zofran ODT   vancomycin 1,250 mg in sodium chloride 0.9 % 250 mL     TAKE these medications   acetaminophen 500 MG tablet Commonly known as: TYLENOL Take 1 tablet (500 mg total) by mouth every 6 (six) hours as needed.   albuterol (2.5 MG/3ML) 0.083% nebulizer solution Commonly known as: PROVENTIL Take 2.5 mg by nebulization every 6 (six) hours as needed for wheezing or shortness of breath.   ascorbic acid 500 MG tablet Commonly known as: VITAMIN C Take 1 tablet (500 mg total) by mouth daily.   aspirin 325 MG tablet Commonly known as: Bayer Aspirin Take 1 tablet (325 mg total) by  mouth daily.   atorvastatin 10 MG tablet Commonly known as: LIPITOR Take 10 mg by mouth daily.   chlorpheniramine-HYDROcodone 10-8 MG/5ML Suer Commonly known as: Tussionex Pennkinetic ER Take 5 mLs by mouth 2 (two) times daily.   fluconazole 100 MG tablet Commonly known as: DIFLUCAN Take 1 tablet (100 mg total) by mouth daily. Start taking on: January 30, 2019   fluticasone 50 MCG/ACT nasal spray Commonly known as: FLONASE Place 1 spray into both nostrils daily.   hydrochlorothiazide 25 MG tablet Commonly known as: HYDRODIURIL Take 25 mg by mouth daily.   hydrOXYzine 25 MG tablet Commonly known as: ATARAX/VISTARIL Take 25 mg by mouth every 6 (six) hours as  needed for anxiety or itching.   metFORMIN 1000 MG tablet Commonly known as: GLUCOPHAGE Take 1,000 mg by mouth daily with breakfast.   Multi-Vitamin tablet Take 1 tablet by mouth daily.   mycophenolate 500 MG tablet Commonly known as: CELLCEPT Take 1 tablet (500 mg total) by mouth 2 (two) times daily. PLEASE RESUME ONLY AFTER DISCUSSING WITH YOUR PRIMARY CARE PRACTITIONER Start taking on: February 06, 2019 What changed:   additional instructions  These instructions start on February 06, 2019. If you are unsure what to do until then, ask your doctor or other care provider.   omeprazole 20 MG capsule Commonly known as: PRILOSEC Take 20 mg by mouth daily.   predniSONE 10 MG tablet Commonly known as: DELTASONE Take 30 mg daily for 1 day, 20 mg daily for 1 days,10 mg daily for 1 day, then stop   vitamin B-12 1000 MCG tablet Commonly known as: CYANOCOBALAMIN Take 1,000 mcg by mouth daily.   zinc sulfate 220 (50 Zn) MG capsule Take 1 capsule (220 mg total) by mouth daily.      Follow-up Information    Primary Care MD. Schedule an appointment as soon as possible for a visit in 1 week(s).          Allergies  Allergen Reactions  . Benzalkonium Chloride Other (See Comments)  . Neosporin  [Neomycin-Bacitracin Zn-Polymyx]     Consultations:   None   Other Procedures/Studies: DG Chest 1 View  Result Date: 01/14/2019 CLINICAL DATA:  Shortness of breath EXAM: CHEST  1 VIEW COMPARISON:  04/24/2010 FINDINGS: Heart is borderline in size. Mild vascular congestion. Bibasilar opacities, likely atelectasis. Small effusions. No acute bony abnormality. IMPRESSION: Borderline heart size, vascular congestion. Bibasilar atelectasis with small effusions. Electronically Signed   By: Rolm Baptise M.D.   On: 01/14/2019 01:17   DG Chest 2 View  Result Date: 01/18/2019 CLINICAL DATA:  Chest pain. COVID-19 positive patient. Shortness of breath. EXAM: CHEST - 2 VIEW COMPARISON:  January 14, 2019 FINDINGS: Bibasilar infiltrates remain. The heart, hila, mediastinum, lungs, and pleura are otherwise unchanged and unremarkable. Chronic compression fracture in the upper lumbar spine. IMPRESSION: Persistent bibasilar infiltrates. Recommend follow-up to resolution. Persistent bibasilar infiltrates, likely due to the patient's COVID-19 status. No other acute abnormalities are seen. Electronically Signed   By: Dorise Bullion III M.D   On: 01/18/2019 16:11   CT ANGIO CHEST PE W OR WO CONTRAST  Result Date: 01/14/2019 CLINICAL DATA:  Progressive dyspnea and cough. Fever. Hypoxia. Positive COVID-19 test 10 days ago. EXAM: CT ANGIOGRAPHY CHEST WITH CONTRAST TECHNIQUE: Multidetector CT imaging of the chest was performed using the standard protocol during bolus administration of intravenous contrast. Multiplanar CT image reconstructions and MIPs were obtained to evaluate the vascular anatomy. CONTRAST:  39mL OMNIPAQUE IOHEXOL 350 MG/ML SOLN COMPARISON:  One-view chest x-ray 01/14/2019 FINDINGS: Cardiovascular: The heart is mildly enlarged. Aorta and great vessel origins are within normal limits. Pulmonary artery opacification is excellent. No focal filling defects are present to suggest pulmonary embolus.  Mediastinum/Nodes: No significant mediastinal, hilar, or axillary adenopathy is present. Lungs/Pleura: Patchy peripheral airspace opacities are present, predominantly in the lower lobes. Finding is consistent with COVID-19 pneumonia. Broad areas are normal without evidence for edema. No significant pleural effusion is present. Upper Abdomen: Diffuse fatty infiltration of the liver is noted. No focal lesions are present. Upper abdomen is otherwise within normal limits. Musculoskeletal: No chest wall abnormality. No acute or significant osseous findings. Review of the MIP images confirms  the above findings. IMPRESSION: 1. No evidence for pulmonary embolus. 2. Patchy peripheral airspace opacities bilaterally compatible with COVID-19 pneumonia. 3. Cardiomegaly without failure. 4. Hepatic steatosis Electronically Signed   By: San Morelle M.D.   On: 01/14/2019 05:28   DG Chest Port 1 View  Result Date: 01/22/2019 CLINICAL DATA:  Increasing SOB that worsened today. Per ER notes: Per EMS pt had O2 sats of 70 when they arrived at her home and she had SOB and recent positive covid test.Hx of CHF, COPD, DM EXAM: PORTABLE CHEST 1 VIEW COMPARISON:  01/18/2019 and earlier exams. FINDINGS: Bilateral airspace lung opacities, which predominate in the lower lungs, have increased from prior exam. No convincing pleural effusion.  No pneumothorax. Cardiac silhouette is normal in size. IMPRESSION: 1. Interval worsening of lung aeration with increased bilateral airspace lung opacities. Findings consistent with worsened COVID-19 pneumonia. Electronically Signed   By: Lajean Manes M.D.   On: 01/22/2019 15:31   VAS [place] LOWER EXTREMITY VENOUS (DVT)  Result Date: 01/28/2019  Lower Venous Study Indications: Positive d dimer.  Comparison Study: no prior Performing Technologist: Abram Sander RVS  Examination Guidelines: A complete evaluation includes B-mode imaging, spectral Doppler, color Doppler, and power Doppler as needed  of all accessible portions of each vessel. Bilateral testing is considered an integral part of a complete examination. Limited examinations for reoccurring indications may be performed as noted.  +---------+---------------+---------+-----------+----------+--------------+ RIGHT    CompressibilityPhasicitySpontaneityPropertiesThrombus Aging +---------+---------------+---------+-----------+----------+--------------+ CFV      Full           Yes      Yes                                 +---------+---------------+---------+-----------+----------+--------------+ SFJ      Full                                                        +---------+---------------+---------+-----------+----------+--------------+ FV Prox  Full                                                        +---------+---------------+---------+-----------+----------+--------------+ FV Mid   Full                                                        +---------+---------------+---------+-----------+----------+--------------+ FV DistalFull                                                        +---------+---------------+---------+-----------+----------+--------------+ PFV      Full                                                        +---------+---------------+---------+-----------+----------+--------------+  POP      Full           Yes      Yes                                 +---------+---------------+---------+-----------+----------+--------------+ PTV      Full                                                        +---------+---------------+---------+-----------+----------+--------------+ PERO     Full                                                        +---------+---------------+---------+-----------+----------+--------------+   +---------+---------------+---------+-----------+----------+--------------+ LEFT     CompressibilityPhasicitySpontaneityPropertiesThrombus Aging  +---------+---------------+---------+-----------+----------+--------------+ CFV      Full           Yes      Yes                                 +---------+---------------+---------+-----------+----------+--------------+ SFJ      Full                                                        +---------+---------------+---------+-----------+----------+--------------+ FV Prox  Full                                                        +---------+---------------+---------+-----------+----------+--------------+ FV Mid   Full                                                        +---------+---------------+---------+-----------+----------+--------------+ FV DistalFull                                                        +---------+---------------+---------+-----------+----------+--------------+ PFV      Full                                                        +---------+---------------+---------+-----------+----------+--------------+ POP      Full           Yes      Yes                                 +---------+---------------+---------+-----------+----------+--------------+  PTV      Full                                                        +---------+---------------+---------+-----------+----------+--------------+ PERO     Full                                                        +---------+---------------+---------+-----------+----------+--------------+     Summary: Right: There is no evidence of deep vein thrombosis in the lower extremity. No cystic structure found in the popliteal fossa. Left: There is no evidence of deep vein thrombosis in the lower extremity. No cystic structure found in the popliteal fossa.  *See table(s) above for measurements and observations.    Preliminary      TODAY-DAY OF DISCHARGE:  Subjective:   Amber Aguilar today has no headache,no chest abdominal pain,no new weakness tingling or numbness, feels much better  wants to go home today.   Objective:   Blood pressure (!) 109/50, pulse 91, temperature 98.3 F (36.8 C), temperature source Axillary, resp. rate 16, height 5\' 2"  (1.575 m), weight 87.9 kg, SpO2 94 %.  Intake/Output Summary (Last 24 hours) at 01/29/2019 1454 Last data filed at 01/29/2019 1300 Gross per 24 hour  Intake 1080 ml  Output 275 ml  Net 805 ml   Filed Weights   01/23/19 2149  Weight: 87.9 kg    Exam: Awake Alert, Oriented *3, No new F.N deficits, Normal affect [place].AT,PERRAL Supple Neck,No JVD, No cervical lymphadenopathy appriciated.  Symmetrical Chest wall movement, Good air movement bilaterally, CTAB RRR,No Gallops,Rubs or new Murmurs, No Parasternal Heave +ve B.Sounds, Abd Soft, Non tender, No organomegaly appriciated, No rebound -guarding or rigidity. No Cyanosis, Clubbing or edema, No new Rash or bruise   PERTINENT RADIOLOGIC STUDIES: DG Chest 1 View  Result Date: 01/14/2019 CLINICAL DATA:  Shortness of breath EXAM: CHEST  1 VIEW COMPARISON:  04/24/2010 FINDINGS: Heart is borderline in size. Mild vascular congestion. Bibasilar opacities, likely atelectasis. Small effusions. No acute bony abnormality. IMPRESSION: Borderline heart size, vascular congestion. Bibasilar atelectasis with small effusions. Electronically Signed   By: Rolm Baptise M.D.   On: 01/14/2019 01:17   DG Chest 2 View  Result Date: 01/18/2019 CLINICAL DATA:  Chest pain. COVID-19 positive patient. Shortness of breath. EXAM: CHEST - 2 VIEW COMPARISON:  January 14, 2019 FINDINGS: Bibasilar infiltrates remain. The heart, hila, mediastinum, lungs, and pleura are otherwise unchanged and unremarkable. Chronic compression fracture in the upper lumbar spine. IMPRESSION: Persistent bibasilar infiltrates. Recommend follow-up to resolution. Persistent bibasilar infiltrates, likely due to the patient's COVID-19 status. No other acute abnormalities are seen. Electronically Signed   By: Dorise Bullion III M.D   On:  01/18/2019 16:11   CT ANGIO CHEST PE W OR WO CONTRAST  Result Date: 01/14/2019 CLINICAL DATA:  Progressive dyspnea and cough. Fever. Hypoxia. Positive COVID-19 test 10 days ago. EXAM: CT ANGIOGRAPHY CHEST WITH CONTRAST TECHNIQUE: Multidetector CT imaging of the chest was performed using the standard protocol during bolus administration of intravenous contrast. Multiplanar CT image reconstructions and MIPs were obtained to evaluate the vascular anatomy. CONTRAST:  56mL OMNIPAQUE IOHEXOL 350 MG/ML SOLN  COMPARISON:  One-view chest x-ray 01/14/2019 FINDINGS: Cardiovascular: The heart is mildly enlarged. Aorta and great vessel origins are within normal limits. Pulmonary artery opacification is excellent. No focal filling defects are present to suggest pulmonary embolus. Mediastinum/Nodes: No significant mediastinal, hilar, or axillary adenopathy is present. Lungs/Pleura: Patchy peripheral airspace opacities are present, predominantly in the lower lobes. Finding is consistent with COVID-19 pneumonia. Broad areas are normal without evidence for edema. No significant pleural effusion is present. Upper Abdomen: Diffuse fatty infiltration of the liver is noted. No focal lesions are present. Upper abdomen is otherwise within normal limits. Musculoskeletal: No chest wall abnormality. No acute or significant osseous findings. Review of the MIP images confirms the above findings. IMPRESSION: 1. No evidence for pulmonary embolus. 2. Patchy peripheral airspace opacities bilaterally compatible with COVID-19 pneumonia. 3. Cardiomegaly without failure. 4. Hepatic steatosis Electronically Signed   By: San Morelle M.D.   On: 01/14/2019 05:28   DG Chest Port 1 View  Result Date: 01/22/2019 CLINICAL DATA:  Increasing SOB that worsened today. Per ER notes: Per EMS pt had O2 sats of 70 when they arrived at her home and she had SOB and recent positive covid test.Hx of CHF, COPD, DM EXAM: PORTABLE CHEST 1 VIEW COMPARISON:   01/18/2019 and earlier exams. FINDINGS: Bilateral airspace lung opacities, which predominate in the lower lungs, have increased from prior exam. No convincing pleural effusion.  No pneumothorax. Cardiac silhouette is normal in size. IMPRESSION: 1. Interval worsening of lung aeration with increased bilateral airspace lung opacities. Findings consistent with worsened COVID-19 pneumonia. Electronically Signed   By: Lajean Manes M.D.   On: 01/22/2019 15:31   VAS [place] LOWER EXTREMITY VENOUS (DVT)  Result Date: 01/28/2019  Lower Venous Study Indications: Positive d dimer.  Comparison Study: no prior Performing Technologist: Abram Sander RVS  Examination Guidelines: A complete evaluation includes B-mode imaging, spectral Doppler, color Doppler, and power Doppler as needed of all accessible portions of each vessel. Bilateral testing is considered an integral part of a complete examination. Limited examinations for reoccurring indications may be performed as noted.  +---------+---------------+---------+-----------+----------+--------------+ RIGHT    CompressibilityPhasicitySpontaneityPropertiesThrombus Aging +---------+---------------+---------+-----------+----------+--------------+ CFV      Full           Yes      Yes                                 +---------+---------------+---------+-----------+----------+--------------+ SFJ      Full                                                        +---------+---------------+---------+-----------+----------+--------------+ FV Prox  Full                                                        +---------+---------------+---------+-----------+----------+--------------+ FV Mid   Full                                                        +---------+---------------+---------+-----------+----------+--------------+  FV DistalFull                                                         +---------+---------------+---------+-----------+----------+--------------+ PFV      Full                                                        +---------+---------------+---------+-----------+----------+--------------+ POP      Full           Yes      Yes                                 +---------+---------------+---------+-----------+----------+--------------+ PTV      Full                                                        +---------+---------------+---------+-----------+----------+--------------+ PERO     Full                                                        +---------+---------------+---------+-----------+----------+--------------+   +---------+---------------+---------+-----------+----------+--------------+ LEFT     CompressibilityPhasicitySpontaneityPropertiesThrombus Aging +---------+---------------+---------+-----------+----------+--------------+ CFV      Full           Yes      Yes                                 +---------+---------------+---------+-----------+----------+--------------+ SFJ      Full                                                        +---------+---------------+---------+-----------+----------+--------------+ FV Prox  Full                                                        +---------+---------------+---------+-----------+----------+--------------+ FV Mid   Full                                                        +---------+---------------+---------+-----------+----------+--------------+ FV DistalFull                                                        +---------+---------------+---------+-----------+----------+--------------+  PFV      Full                                                        +---------+---------------+---------+-----------+----------+--------------+ POP      Full           Yes      Yes                                  +---------+---------------+---------+-----------+----------+--------------+ PTV      Full                                                        +---------+---------------+---------+-----------+----------+--------------+ PERO     Full                                                        +---------+---------------+---------+-----------+----------+--------------+     Summary: Right: There is no evidence of deep vein thrombosis in the lower extremity. No cystic structure found in the popliteal fossa. Left: There is no evidence of deep vein thrombosis in the lower extremity. No cystic structure found in the popliteal fossa.  *See table(s) above for measurements and observations.    Preliminary      PERTINENT LAB RESULTS: CBC: Recent Labs    01/27/19 0335 01/28/19 0050  WBC 15.2* 20.9*  HGB 12.1 13.2  HCT 37.7 40.7  PLT 457* 487*   CMET CMP     Component Value Date/Time   NA 138 01/29/2019 1220   NA 140 10/29/2013 1821   K 4.3 01/29/2019 1220   K 3.2 (L) 10/29/2013 1821   CL 104 01/29/2019 1220   CL 100 10/29/2013 1821   CO2 25 01/29/2019 1220   CO2 29 10/29/2013 1821   GLUCOSE 192 (H) 01/29/2019 1220   GLUCOSE 132 (H) 10/29/2013 1821   BUN 21 01/29/2019 1220   BUN 19 (H) 10/29/2013 1821   CREATININE 0.73 01/29/2019 1220   CREATININE 1.00 10/29/2013 1821   CALCIUM 8.6 (L) 01/29/2019 1220   CALCIUM 9.1 10/29/2013 1821   PROT 6.5 01/28/2019 1757   ALBUMIN 3.2 (L) 01/28/2019 1757   AST 53 (H) 01/28/2019 1757   ALT 52 (H) 01/28/2019 1757   ALKPHOS 61 01/28/2019 1757   BILITOT 1.6 (H) 01/28/2019 1757   GFRNONAA >60 01/29/2019 1220   GFRNONAA >60 10/29/2013 1821   GFRAA >60 01/29/2019 1220   GFRAA >60 10/29/2013 1821    GFR Estimated Creatinine Clearance: 76 mL/min (by C-G formula based on SCr of 0.73 mg/dL). No results for input(s): LIPASE, AMYLASE in the last 72 hours. No results for input(s): CKTOTAL, CKMB, CKMBINDEX, TROPONINI in the last 72 hours. Invalid  input(s): [place]    01/28/19 0050 01/28/19 1757  DDIMER 5.49* 3.66*   No results for input(s): HGBA1C in the last 72 hours. No results for input(s): CHOL, HDL, LDLCALC, TRIG, CHOLHDL, LDLDIRECT in the last 72  hours. No results for input(s): TSH, T4TOTAL, T3FREE, THYROIDAB in the last 72 hours.  Invalid input(s): FREET3 Recent Labs    01/27/19 0335 01/28/19 0050  FERRITIN 500* 463*   Coags: No results for input(s): INR in the last 72 hours.  Invalid input(s): PT Microbiology: Recent Results (from the past 240 hour(s))  Blood Culture (routine x 2)     Status: None   Collection Time: 01/22/19  3:01 PM   Specimen: BLOOD  Result Value Ref Range Status   Specimen Description BLOOD RIGHT ANTECUBITAL  Final   Special Requests   Final    BOTTLES DRAWN AEROBIC AND ANAEROBIC Blood Culture adequate volume   Culture   Final    NO GROWTH 5 DAYS Performed at [facility], [address] [place], [place] [address]    Report Status 01/27/2019 FINAL  Final  Blood Culture (routine x 2)     Status: None   Collection Time: 01/22/19  3:07 PM   Specimen: BLOOD  Result Value Ref Range Status   Specimen Description BLOOD LEFT ANTECUBITAL  Final   Special Requests   Final    BOTTLES DRAWN AEROBIC AND ANAEROBIC Blood Culture adequate volume   Culture   Final    NO GROWTH 5 DAYS Performed at [facility], [address] [place], [place] [address]    Report Status 01/27/2019 FINAL  Final  MRSA PCR Screening     Status: None   Collection Time: 01/22/19 11:44 PM   Specimen: Nasal Mucosa; Nasopharyngeal  Result Value Ref Range Status   MRSA by PCR NEGATIVE NEGATIVE Final    Comment:        The GeneXpert MRSA Assay (FDA approved for NASAL specimens only), is one component of a comprehensive MRSA colonization surveillance program. It is not intended to diagnose MRSA infection nor to guide or monitor treatment for MRSA infections. Performed at [facility], [address] [place], [place] [address]     FURTHER DISCHARGE INSTRUCTIONS:  Get Medicines reviewed and adjusted: Please take all your medications with you for your next visit with your Primary MD  Laboratory/radiological data: Please request your Primary MD to go over all hospital tests and procedure/radiological results at the follow up, please ask your Primary MD to get all Hospital records sent to his/her office.  In some cases, they will be blood work, cultures and biopsy results pending at the time of your discharge. Please request that your primary care M.D. goes through all the records of your hospital data and follows up on these results.  Also Note the following: If you experience worsening of your admission symptoms, develop shortness of breath, life threatening emergency, suicidal or homicidal thoughts you must seek medical attention immediately by calling 911 or calling your MD immediately  if symptoms less severe.  You must read complete instructions/literature along with all the possible adverse reactions/side effects for all the Medicines you take and that have been prescribed to you. Take any new Medicines after you have completely understood and accpet all the possible adverse reactions/side effects.   Do not drive when taking Pain medications or sleeping medications (Benzodaizepines)  Do not take more than prescribed Pain, Sleep and Anxiety Medications. It is not advisable to combine anxiety,sleep and pain medications without talking with your primary care practitioner  Special Instructions: If you have smoked or chewed Tobacco  in the last 2 yrs please stop smoking, stop any regular Alcohol  and or any Recreational  drug use.  Wear Seat belts while driving.  Please note: You were cared for by a hospitalist during your hospital stay. Once you are discharged, your primary care physician will handle any further medical issues. Please note that NO REFILLS  for any discharge medications will be authorized once you are discharged, as it is imperative that you return to your primary care physician (or establish a relationship with a primary care physician if you do not have one) for your post hospital discharge needs so that they can reassess your need for medications and monitor your lab values.  Total Time spent coordinating discharge including counseling, education and face to face time equals 35 minutes.  SignedOren Binet 01/29/2019 2:54 PM

## 2019-01-29 NOTE — Plan of Care (Addendum)
Patient planned for discharge to home. Husband to provide transportation and clothing. ETA 1730.  Update: Discussed discharge goals with patient including new medication education. Patient transported to lobby in wheelchair. Personal belongings transported with patient.

## 2019-01-31 ENCOUNTER — Encounter: Payer: Self-pay | Admitting: *Deleted

## 2019-01-31 ENCOUNTER — Other Ambulatory Visit: Payer: Self-pay | Admitting: *Deleted

## 2019-01-31 NOTE — Patient Outreach (Signed)
[name]) Care Management  01/31/2019  Amber Aguilar 05-11-1955 [id number]    Transition of care call/case closure   Referral received:01/24/19 Initial outreach:01/31/19 Insurance: [place] [employer]    Subjective: Initial successful telephone call to patient's preferred number in order to complete transition of care assessment; No answer able to leave a HIPAA compliant message for return call.  1255 Received return call from patient.   2 HIPAA identifiers verified. Explained purpose of call and completed transition of care assessment.  States she is doing a little better,denies worsening of shortness of breath, speaking in complete sentencing. She denies elevated temperature, reports oxygen saturation at 99%, she reports using incentive spirometry. She discussed recent hospitalization on covid 19 diagnosis , stating she believes pneumonia was worse due to her chronic lung conditions. She reports  tolerating diet, denies bowel or bladder problems. Patient spouse and daughter  assisting with her  recovery.  She discussed contacting PCP office for an appointment , earliest appointment date in January , but she has telephonic visit with Pulmonary MD on 12/22. Patient verbalized understanding of discharge instructions regarding restarting cellcept on 12/27 only after discussing with her primary care provider, she states that her daughter is helping her keep up with medications.  She discussed  any ongoing health issues of Diabetes, Rheumatoid Arthritis and Interstitial lung condition. Reviewed with patient from previous outreach ,placing a referral to  the [facility] chronic disease management programs.She discussed that her blood sugars have improved and reading this morning was 92.  She says that she has made contact regarding FMLA .  She  uses a [facility] outpatient pharmacy.  She denies educational needs related to staying safe during the [place] 19 pandemic reports husband her  primary provider and using mask,proper handwashing and cleaning steps.    Objective:  [name] presented to [facility] on 12/12 with Covid 19 hypoxic respiratory failure she was transferred to [facility] on 1213/20 for management of severe hypoxia secondary to Covid 19 pneumonia and concurrent bacterial pneumonia.  Comorbidities include: Rheumatoid arthritis, with associated Interstitial lung disease with Rituxan infusion and cellcept,  COPD, Diabetes, Hypertension. Patient with recent hospital admission on 12/4- 12/5 at [facility] for Covid 19 infection, with positive covid test on 01/04/19  She  was discharged to home on 01/29/19  without the need for home health services or DME.    Assessment:  Patient voices good understanding of all discharge instructions.  See transition of care flowsheet for assessment details.   Plan:  Reviewed hospital discharge diagnosis of Covid 19 ad bacterial pneumonia   and discharge treatment plan using hospital discharge instructions, assessing medication adherence, reviewing problems requiring provider notification, and discussing the importance of follow up with primary care provider and/or specialists as directed.  Using [facility] website, verified that patient referral placed on 01/17/19  in [facility] chronic disease management program    No ongoing care management needs identified so will close case to [place] [facility] services and route successful outreach letter with [place] Management pamphlet and 24 Hour Nurse Line Magnet to [facility] clinical pool to be mailed to patient's home address. Thanked patient for their services to [facility].   Joylene Draft, RN, [place] Management Coordinator  [phone]- Mobile [phone]- Toll Free Main Office

## 2019-02-03 ENCOUNTER — Ambulatory Visit: Payer: Self-pay | Admitting: *Deleted

## 2019-02-21 DIAGNOSIS — R05 Cough: Secondary | ICD-10-CM | POA: Diagnosis not present

## 2019-03-03 ENCOUNTER — Other Ambulatory Visit: Payer: Self-pay

## 2019-03-29 DIAGNOSIS — T148XXA Other injury of unspecified body region, initial encounter: Secondary | ICD-10-CM | POA: Diagnosis not present

## 2019-03-29 DIAGNOSIS — E119 Type 2 diabetes mellitus without complications: Secondary | ICD-10-CM | POA: Diagnosis not present

## 2019-03-29 DIAGNOSIS — G4700 Insomnia, unspecified: Secondary | ICD-10-CM | POA: Diagnosis not present

## 2019-07-15 DIAGNOSIS — E119 Type 2 diabetes mellitus without complications: Secondary | ICD-10-CM | POA: Diagnosis not present

## 2019-07-15 DIAGNOSIS — T148XXA Other injury of unspecified body region, initial encounter: Secondary | ICD-10-CM | POA: Diagnosis not present

## 2019-08-22 DIAGNOSIS — M059 Rheumatoid arthritis with rheumatoid factor, unspecified: Secondary | ICD-10-CM | POA: Diagnosis not present

## 2019-09-12 DIAGNOSIS — Z23 Encounter for immunization: Secondary | ICD-10-CM | POA: Diagnosis not present

## 2019-10-10 ENCOUNTER — Other Ambulatory Visit: Payer: Self-pay | Admitting: Internal Medicine

## 2019-10-10 DIAGNOSIS — J8489 Other specified interstitial pulmonary diseases: Secondary | ICD-10-CM | POA: Diagnosis not present

## 2019-10-10 DIAGNOSIS — M25473 Effusion, unspecified ankle: Secondary | ICD-10-CM | POA: Diagnosis not present

## 2019-10-10 DIAGNOSIS — Z8616 Personal history of COVID-19: Secondary | ICD-10-CM | POA: Diagnosis not present

## 2019-10-10 DIAGNOSIS — M359 Systemic involvement of connective tissue, unspecified: Secondary | ICD-10-CM | POA: Diagnosis not present

## 2019-10-10 DIAGNOSIS — J849 Interstitial pulmonary disease, unspecified: Secondary | ICD-10-CM | POA: Diagnosis not present

## 2019-10-10 DIAGNOSIS — E119 Type 2 diabetes mellitus without complications: Secondary | ICD-10-CM | POA: Diagnosis not present

## 2019-10-10 DIAGNOSIS — M0579 Rheumatoid arthritis with rheumatoid factor of multiple sites without organ or systems involvement: Secondary | ICD-10-CM | POA: Diagnosis not present

## 2019-10-10 DIAGNOSIS — E785 Hyperlipidemia, unspecified: Secondary | ICD-10-CM | POA: Diagnosis not present

## 2019-10-11 DIAGNOSIS — Z23 Encounter for immunization: Secondary | ICD-10-CM | POA: Diagnosis not present

## 2019-11-13 ENCOUNTER — Encounter: Payer: Self-pay | Admitting: Emergency Medicine

## 2019-11-13 ENCOUNTER — Other Ambulatory Visit: Payer: Self-pay

## 2019-11-13 ENCOUNTER — Emergency Department
Admission: EM | Admit: 2019-11-13 | Discharge: 2019-11-13 | Disposition: A | Discharge status: Home / Self Care | Payer: 59 | Attending: Emergency Medicine | Admitting: Emergency Medicine

## 2019-11-13 DIAGNOSIS — X501XXA Overexertion from prolonged static or awkward postures, initial encounter: Secondary | ICD-10-CM | POA: Insufficient documentation

## 2019-11-13 DIAGNOSIS — Z7951 Long term (current) use of inhaled steroids: Secondary | ICD-10-CM | POA: Insufficient documentation

## 2019-11-13 DIAGNOSIS — E119 Type 2 diabetes mellitus without complications: Secondary | ICD-10-CM | POA: Diagnosis not present

## 2019-11-13 DIAGNOSIS — Z7984 Long term (current) use of oral hypoglycemic drugs: Secondary | ICD-10-CM | POA: Diagnosis not present

## 2019-11-13 DIAGNOSIS — J449 Chronic obstructive pulmonary disease, unspecified: Secondary | ICD-10-CM | POA: Diagnosis not present

## 2019-11-13 DIAGNOSIS — S39012A Strain of muscle, fascia and tendon of lower back, initial encounter: Secondary | ICD-10-CM | POA: Insufficient documentation

## 2019-11-13 DIAGNOSIS — Z7982 Long term (current) use of aspirin: Secondary | ICD-10-CM | POA: Diagnosis not present

## 2019-11-13 DIAGNOSIS — Z79899 Other long term (current) drug therapy: Secondary | ICD-10-CM | POA: Insufficient documentation

## 2019-11-13 DIAGNOSIS — M545 Low back pain, unspecified: Secondary | ICD-10-CM | POA: Diagnosis present

## 2019-11-13 DIAGNOSIS — M5432 Sciatica, left side: Secondary | ICD-10-CM

## 2019-11-13 DIAGNOSIS — Z8616 Personal history of COVID-19: Secondary | ICD-10-CM | POA: Diagnosis not present

## 2019-11-13 DIAGNOSIS — M5442 Lumbago with sciatica, left side: Secondary | ICD-10-CM | POA: Diagnosis not present

## 2019-11-13 DIAGNOSIS — I509 Heart failure, unspecified: Secondary | ICD-10-CM | POA: Insufficient documentation

## 2019-11-13 MED ORDER — CYCLOBENZAPRINE HCL 5 MG PO TABS: 5.0000 mg | ORAL_TABLET | Freq: Three times a day (TID) | ORAL | 0 refills | Status: DC | PRN

## 2019-11-13 MED ORDER — HYDROCODONE-ACETAMINOPHEN 5-325 MG PO TABS
1.0000 | ORAL_TABLET | Freq: Once | ORAL | Status: AC
  Administered 2019-11-13: 1 via ORAL
  Filled 2019-11-13: qty 1

## 2019-11-13 MED ORDER — HYDROCODONE-ACETAMINOPHEN 5-325 MG PO TABS: 1.0000 | ORAL_TABLET | Freq: Three times a day (TID) | ORAL | 0 refills | Status: AC | PRN

## 2019-11-13 MED ORDER — KETOROLAC TROMETHAMINE 10 MG PO TABS: 10.0000 mg | ORAL_TABLET | Freq: Three times a day (TID) | ORAL | 0 refills | Status: DC

## 2019-11-13 MED ORDER — KETOROLAC TROMETHAMINE 30 MG/ML IJ SOLN
30.0000 mg | Freq: Once | INTRAMUSCULAR | Status: AC
  Administered 2019-11-13: 30 mg via INTRAMUSCULAR
  Filled 2019-11-13: qty 1

## 2019-11-13 MED ORDER — CYCLOBENZAPRINE HCL 10 MG PO TABS
10.0000 mg | ORAL_TABLET | Freq: Once | ORAL | Status: AC
  Administered 2019-11-13: 10 mg via ORAL
  Filled 2019-11-13: qty 1

## 2019-11-13 NOTE — ED Provider Notes (Signed)
[facility] Emergency Department Provider Note ____________________________________________  Time seen: 1159  I have reviewed the triage vital signs and the nursing notes.  HISTORY  Chief Complaint  Back Pain  HPI Amber Aguilar is a 64 y.o. female with the below medical history, presents herself to the ED for evaluation of acute left-sided low back pain.  Patient describes onset 3 days prior, following mechanical injury.  She describes twisting, when she felt a pull to the left side.  Since that time she has had low back pain with referral down the left leg.  She denies any bladder or bowel incontinence, foot drop, or saddle anesthesia.   Past Medical History:  Diagnosis Date  . Arthritis    rheumatoid  . CHF (congestive heart failure) ([place])   . COPD (chronic obstructive pulmonary disease) ([place])   . Diabetes mellitus without complication [facility])     Patient Active Problem List   Diagnosis Date Noted  . Acute hypoxemic respiratory failure due to severe acute respiratory syndrome coronavirus 2 (SARS-CoV-2) disease ([place]) 01/23/2019  . Pneumonia due to COVID-19 virus 01/22/2019  . Acute hypoxemic respiratory failure ([place]) 01/22/2019  . Rheumatoid arthritis ([place]) 01/17/2019  . Interstitial lung disease ([place]) 01/17/2019  . Diabetes mellitus ([place])   . Acute respiratory failure due to COVID-19 [facility]) 01/14/2019    Past Surgical History:  Procedure Laterality Date  . CARPAL TUNNEL RELEASE      Prior to Admission medications   Medication Sig Start Date End Date Taking? Authorizing Provider  acetaminophen (TYLENOL) 500 MG tablet Take 1 tablet (500 mg total) by mouth every 6 (six) hours as needed. 04/24/18   Laban Emperor, PA-C  albuterol (PROVENTIL) (2.5 MG/3ML) 0.083% nebulizer solution Take 2.5 mg by nebulization every 6 (six) hours as needed for wheezing or shortness of breath.    [provider]  aspirin (BAYER ASPIRIN) 325 MG tablet Take 1 tablet  (325 mg total) by mouth daily. 01/29/19 01/29/20  Ghimire, Henreitta Leber, MD  atorvastatin (LIPITOR) 10 MG tablet Take 10 mg by mouth daily.    [provider]  cyclobenzaprine (FLEXERIL) 5 MG tablet Take 1 tablet (5 mg total) by mouth 3 (three) times daily as needed. 11/13/19   Macarena Langseth, Dannielle Karvonen, PA-C  fluticasone (FLONASE) 50 MCG/ACT nasal spray Place 1 spray into both nostrils daily.     [provider]  hydrochlorothiazide (HYDRODIURIL) 25 MG tablet Take 25 mg by mouth daily.    [provider]  HYDROcodone-acetaminophen (NORCO) 5-325 MG tablet Take 1 tablet by mouth 3 (three) times daily as needed for up to 3 days. 11/13/19 11/16/19  Deshae Dickison, Dannielle Karvonen, PA-C  hydrOXYzine (ATARAX/VISTARIL) 25 MG tablet Take 25 mg by mouth every 6 (six) hours as needed for anxiety or itching.  11/16/18   [provider]  ketorolac (TORADOL) 10 MG tablet Take 1 tablet (10 mg total) by mouth every 8 (eight) hours. 11/13/19   Yailin Biederman, Dannielle Karvonen, PA-C  metFORMIN (GLUCOPHAGE) 1000 MG tablet Take 1,000 mg by mouth daily with breakfast.    [provider]  Multiple Vitamin (MULTI-VITAMIN) tablet Take 1 tablet by mouth daily.     [provider]  mycophenolate (CELLCEPT) 500 MG tablet Take 1 tablet (500 mg total) by mouth 2 (two) times daily. PLEASE RESUME ONLY AFTER DISCUSSING WITH YOUR PRIMARY CARE PRACTITIONER 02/06/19   Jonetta Osgood, MD  omeprazole (PRILOSEC) 20 MG capsule Take 20 mg by mouth daily.  [provider]  vitamin B-12 (CYANOCOBALAMIN) 1000 MCG tablet Take 1,000 mcg by mouth daily.     [provider]  vitamin C (VITAMIN C) 500 MG tablet Take 1 tablet (500 mg total) by mouth daily. 01/24/19   Lorella Nimrod, MD  zinc sulfate 220 (50 Zn) MG capsule Take 1 capsule (220 mg total) by mouth daily. 01/24/19   Lorella Nimrod, MD    Allergies Benzalkonium chloride and Neosporin [neomycin-bacitracin zn-polymyx]  History  reviewed. No pertinent family history.  Social History Social History   Tobacco Use  . Smoking status: Never Smoker  . Smokeless tobacco: Never Used  Vaping Use  . Vaping Use: Never used  Substance Use Topics  . Alcohol use: No  . Drug use: No    Review of Systems  Constitutional: Negative for fever. Cardiovascular: Negative for chest pain. Respiratory: Negative for shortness of breath. Gastrointestinal: Negative for abdominal pain, vomiting and diarrhea. Genitourinary: Negative for dysuria. Musculoskeletal: Positive for back pain. Skin: Negative for rash. Neurological: Negative for headaches, focal weakness or numbness. ____________________________________________  PHYSICAL EXAM:  VITAL SIGNS: ED Triage Vitals  Enc Vitals Group     BP 11/13/19 0916 129/61     Pulse Rate 11/13/19 0916 70     Resp 11/13/19 0916 18     Temp 11/13/19 0916 98.2 F (36.8 C)     Temp Source 11/13/19 0916 Oral     SpO2 11/13/19 0916 100 %     Weight 11/13/19 0917 193 lb (87.5 kg)     Height 11/13/19 0917 5\' 2"  (1.575 m)     Head Circumference --      Peak Flow --      Pain Score 11/13/19 0921 10     Pain Loc --      Pain Edu? --      Excl. in [place]? --     Constitutional: Alert and oriented. Well appearing and in no distress. Head: Normocephalic and atraumatic. Eyes: Conjunctivae are normal. Normal extraocular movements Cardiovascular: Normal rate, regular rhythm. Normal distal pulses. Respiratory: Normal respiratory effort. No wheezes/rales/rhonchi. Gastrointestinal: Soft and nontender. No distention. Musculoskeletal: Normal spinal alignment without midline tenderness, spasm, deformity, or step-off.  Patient with slow transition from sit to stand.  She is unable to bear weight fully through the left leg.  She localizes pain to the left lumbar sacral junction and SI joint.  Normal hip flexion and extension on exam.  Normal internal and external rotation of the hip noted.  Normal lumbar  flexion at the waist, and decreased extension secondary to subjective complaints of pain.  Nontender with normal range of motion in all extremities.  Neurologic: Cranial nerves II through XII grossly intact.  Mildly antalgic gait without ataxia.  Essentially negative supine straight leg raise bilaterally.  Normal toe dorsiflexion on exam.  Normal DTRs bilaterally.  Normal speech and language. No gross focal neurologic deficits are appreciated. Skin:  Skin is warm, dry and intact. No rash noted. Psychiatric: Mood and affect are normal. Patient exhibits appropriate insight and judgment. ___________________________________________   RADIOLOGY  Not indicated ____________________________________________  PROCEDURES  Toradol 30 mg IM Flexeril 10 mg PO Norco 5-325 mg PO  Procedures ____________________________________________  INITIAL IMPRESSION / ASSESSMENT AND PLAN / ED COURSE  DDX: lumbar strain, sciatica, sacroiliitis, cauda equina, DDD  Patient with ED evaluation of acute lumbosacral strain with some sciatic nerve irritation following mechanical injury.  Her exam is overall benign return at this time.  No red flags  on exam.  No bladder bowel incontinence, foot drop, or saddle anesthesia.  Symptoms are likely consistent with a lumbar strain.  Patient will be treated with anti-inflammatories, muscle relaxant, small prescription for pain medicine.  She is to follow-up with primary provider or employee health for ongoing symptoms.  Precautions provided for 2 days as requested.  Amber Aguilar was evaluated in Emergency Department on 11/13/2019 for the symptoms described in the history of present illness. She was evaluated in the context of the global COVID-19 pandemic, which necessitated consideration that the patient might be at risk for infection with the SARS-CoV-2 virus that causes COVID-19. Institutional protocols and algorithms that pertain to the evaluation of patients at risk for COVID-19  are in a state of rapid change based on information released by regulatory bodies including the CDC and federal and state organizations. These policies and algorithms were followed during the patient's care in the ED. ____________________________________________  FINAL CLINICAL IMPRESSION(S) / ED DIAGNOSES  Final diagnoses:  Strain of lumbar region, initial encounter  Sciatica of left side      Zacharia Sowles, Dannielle Karvonen, PA-C 11/13/19 1501    Vladimir Crofts, MD 11/13/19 1525

## 2019-11-13 NOTE — ED Triage Notes (Signed)
Pt here for lower left back pain. Felt something pull when turned Friday but pain worse.  Pain goes down left leg. NAD. VSS.  Worse with movement.

## 2019-11-13 NOTE — ED Notes (Signed)
Signature pad failed. Discharge paperwork was reviewed with patient and patient verbalized understanding and had no questions at this time.

## 2019-11-13 NOTE — ED Notes (Signed)
Pt to the room in a wheelchair complaining of new left sided muscular back pain, she states she is not able to walk, pain is 10/10.

## 2019-11-13 NOTE — Discharge Instructions (Addendum)
Your exam is consistent with a lumbar strain with some sciatic nerve irritation.  Will be treated with anti-inflammatories, muscle relaxants, and a small dose of pain medicines.  Follow-up with your primary provider or [facility] employee health ongoing symptoms.  Return to the ED as needed.

## 2019-11-16 ENCOUNTER — Other Ambulatory Visit: Payer: Self-pay

## 2019-11-16 ENCOUNTER — Emergency Department
Admission: EM | Admit: 2019-11-16 | Discharge: 2019-11-16 | Disposition: A | Discharge status: Home / Self Care | Payer: 59 | Attending: Emergency Medicine | Admitting: Emergency Medicine

## 2019-11-16 ENCOUNTER — Emergency Department: Payer: 59

## 2019-11-16 ENCOUNTER — Encounter: Payer: Self-pay | Admitting: Emergency Medicine

## 2019-11-16 ENCOUNTER — Other Ambulatory Visit: Payer: Self-pay | Admitting: Physician Assistant

## 2019-11-16 DIAGNOSIS — J449 Chronic obstructive pulmonary disease, unspecified: Secondary | ICD-10-CM | POA: Diagnosis not present

## 2019-11-16 DIAGNOSIS — I509 Heart failure, unspecified: Secondary | ICD-10-CM | POA: Diagnosis not present

## 2019-11-16 DIAGNOSIS — S32010A Wedge compression fracture of first lumbar vertebra, initial encounter for closed fracture: Secondary | ICD-10-CM | POA: Diagnosis not present

## 2019-11-16 DIAGNOSIS — X501XXA Overexertion from prolonged static or awkward postures, initial encounter: Secondary | ICD-10-CM | POA: Diagnosis not present

## 2019-11-16 DIAGNOSIS — S3992XA Unspecified injury of lower back, initial encounter: Secondary | ICD-10-CM | POA: Diagnosis present

## 2019-11-16 DIAGNOSIS — M545 Low back pain, unspecified: Secondary | ICD-10-CM | POA: Diagnosis not present

## 2019-11-16 DIAGNOSIS — Z7984 Long term (current) use of oral hypoglycemic drugs: Secondary | ICD-10-CM | POA: Insufficient documentation

## 2019-11-16 DIAGNOSIS — E119 Type 2 diabetes mellitus without complications: Secondary | ICD-10-CM | POA: Insufficient documentation

## 2019-11-16 DIAGNOSIS — M4716 Other spondylosis with myelopathy, lumbar region: Secondary | ICD-10-CM | POA: Insufficient documentation

## 2019-11-16 DIAGNOSIS — S32020A Wedge compression fracture of second lumbar vertebra, initial encounter for closed fracture: Secondary | ICD-10-CM

## 2019-11-16 MED ORDER — ORPHENADRINE CITRATE 30 MG/ML IJ SOLN
60.0000 mg | Freq: Two times a day (BID) | INTRAMUSCULAR | Status: DC
  Administered 2019-11-16: 60 mg via INTRAMUSCULAR
  Filled 2019-11-16: qty 2

## 2019-11-16 MED ORDER — ORPHENADRINE CITRATE ER 100 MG PO TB12: 100.0000 mg | ORAL_TABLET | Freq: Two times a day (BID) | ORAL | 0 refills | Status: DC

## 2019-11-16 MED ORDER — HYDROMORPHONE HCL 1 MG/ML IJ SOLN
1.0000 mg | Freq: Once | INTRAMUSCULAR | Status: AC
  Administered 2019-11-16: 1 mg via INTRAMUSCULAR
  Filled 2019-11-16: qty 1

## 2019-11-16 MED ORDER — OXYCODONE-ACETAMINOPHEN 7.5-325 MG PO TABS: 1.0000 | ORAL_TABLET | Freq: Four times a day (QID) | ORAL | 0 refills | Status: DC | PRN

## 2019-11-16 NOTE — Discharge Instructions (Signed)
Follow discharge care instruction take medication as directed. °

## 2019-11-16 NOTE — ED Notes (Signed)
Pt alert and oriented X 4, stable for discharge. RR even and unlabored, color WNL. Discussed discharge instructions and follow-up as directed. Discharge medications discussed if provided. Pt had opportunity to ask questions if necessary and RN to provide patient/family eduction.  

## 2019-11-16 NOTE — ED Provider Notes (Signed)
[facility] Emergency Department Provider Note   ____________________________________________   First MD Initiated Contact with Patient 11/16/19 1102     (approximate)  I have reviewed the triage vital signs and the nursing notes.   HISTORY  Chief Complaint Back Pain    HPI Amber Aguilar is a 64 y.o. female patient presents with continue low back pain with radicular component to the left lower extremity.  Onset of complaint was secondary to a twisting incident.  Patient was initially seen in this department and followed up with [facility] rheumatology.  Both times she was diagnosed with strain of the lumbar region.  Patient states she has not responded to pain medication, anti-inflammatory, and muscle relaxants.  Patient denies bladder or bowel dysfunction.  Patient states she was a borderline diabetic and is being weaned off of Metformin.  Patient rates pain as a 10/10.         Past Medical History:  Diagnosis Date  . Arthritis    rheumatoid  . CHF (congestive heart failure) ([place])   . COPD (chronic obstructive pulmonary disease) ([place])   . Diabetes mellitus without complication [facility])     Patient Active Problem List   Diagnosis Date Noted  . Acute hypoxemic respiratory failure due to severe acute respiratory syndrome coronavirus 2 (SARS-CoV-2) disease ([place]) 01/23/2019  . Pneumonia due to COVID-19 virus 01/22/2019  . Acute hypoxemic respiratory failure ([place]) 01/22/2019  . Rheumatoid arthritis ([place]) 01/17/2019  . Interstitial lung disease ([place]) 01/17/2019  . Diabetes mellitus ([place])   . Acute respiratory failure due to COVID-19 [facility]) 01/14/2019    Past Surgical History:  Procedure Laterality Date  . CARPAL TUNNEL RELEASE      Prior to Admission medications   Medication Sig Start Date End Date Taking? Authorizing Provider  acetaminophen (TYLENOL) 500 MG tablet Take 1 tablet (500 mg total) by mouth every 6 (six) hours as needed. 04/24/18   Laban Emperor, PA-C  albuterol (PROVENTIL) (2.5 MG/3ML) 0.083% nebulizer solution Take 2.5 mg by nebulization every 6 (six) hours as needed for wheezing or shortness of breath.    [provider]  aspirin (BAYER ASPIRIN) 325 MG tablet Take 1 tablet (325 mg total) by mouth daily. 01/29/19 01/29/20  Ghimire, Henreitta Leber, MD  atorvastatin (LIPITOR) 10 MG tablet Take 10 mg by mouth daily.    [provider]  cyclobenzaprine (FLEXERIL) 5 MG tablet Take 1 tablet (5 mg total) by mouth 3 (three) times daily as needed. 11/13/19   Menshew, Dannielle Karvonen, PA-C  fluticasone (FLONASE) 50 MCG/ACT nasal spray Place 1 spray into both nostrils daily.     [provider]  hydrochlorothiazide (HYDRODIURIL) 25 MG tablet Take 25 mg by mouth daily.    [provider]  HYDROcodone-acetaminophen (NORCO) 5-325 MG tablet Take 1 tablet by mouth 3 (three) times daily as needed for up to 3 days. 11/13/19 11/16/19  Menshew, Dannielle Karvonen, PA-C  hydrOXYzine (ATARAX/VISTARIL) 25 MG tablet Take 25 mg by mouth every 6 (six) hours as needed for anxiety or itching.  11/16/18   [provider]  ketorolac (TORADOL) 10 MG tablet Take 1 tablet (10 mg total) by mouth every 8 (eight) hours. 11/13/19   Menshew, Dannielle Karvonen, PA-C  metFORMIN (GLUCOPHAGE) 1000 MG tablet Take 1,000 mg by mouth daily with breakfast.    [provider]  Multiple Vitamin (MULTI-VITAMIN) tablet Take 1 tablet by mouth daily.     [provider]  mycophenolate (CELLCEPT)  500 MG tablet Take 1 tablet (500 mg total) by mouth 2 (two) times daily. PLEASE RESUME ONLY AFTER DISCUSSING WITH YOUR PRIMARY CARE PRACTITIONER 02/06/19   Jonetta Osgood, MD  omeprazole (PRILOSEC) 20 MG capsule Take 20 mg by mouth daily.    [provider]  orphenadrine (NORFLEX) 100 MG tablet Take 1 tablet (100 mg total) by mouth 2 (two) times daily. 11/16/19   Sable Feil, PA-C  oxyCODONE-acetaminophen (PERCOCET) 7.5-325 MG tablet  Take 1 tablet by mouth every 6 (six) hours as needed. 11/16/19   Sable Feil, PA-C  vitamin B-12 (CYANOCOBALAMIN) 1000 MCG tablet Take 1,000 mcg by mouth daily.     [provider]  vitamin C (VITAMIN C) 500 MG tablet Take 1 tablet (500 mg total) by mouth daily. 01/24/19   Lorella Nimrod, MD  zinc sulfate 220 (50 Zn) MG capsule Take 1 capsule (220 mg total) by mouth daily. 01/24/19   Lorella Nimrod, MD    Allergies Benzalkonium chloride and Neosporin [neomycin-bacitracin zn-polymyx]  No family history on file.  Social History Social History   Tobacco Use  . Smoking status: Never Smoker  . Smokeless tobacco: Never Used  Vaping Use  . Vaping Use: Never used  Substance Use Topics  . Alcohol use: No  . Drug use: No    Review of Systems Constitutional: No fever/chills Eyes: No visual changes. ENT: No sore throat. Cardiovascular: Denies chest pain. Respiratory: Denies shortness of breath. Gastrointestinal: No abdominal pain.  No nausea, no vomiting.  No diarrhea.  No constipation. Genitourinary: Negative for dysuria. Musculoskeletal: Positive for back pain. Skin: Negative for rash. Neurological: Negative for headaches, focal weakness or numbness. Endocrine:  Diabetes Allergic/Immunilogical: Neosporin ____________________________________________   PHYSICAL EXAM:  VITAL SIGNS: ED Triage Vitals  Enc Vitals Group     BP 11/16/19 0932 136/60     Pulse Rate 11/16/19 0932 70     Resp 11/16/19 0932 17     Temp 11/16/19 0932 99 F (37.2 C)     Temp Source 11/16/19 0932 Oral     SpO2 11/16/19 0932 100 %     Weight 11/16/19 0933 195 lb (88.5 kg)     Height 11/16/19 0933 5\' 2"  (1.575 m)     Head Circumference --      Peak Flow --      Pain Score 11/16/19 0933 10     Pain Loc --      Pain Edu? --      Excl. in [place]? --    Constitutional: Alert and oriented. Well appearing and in no acute distress. Cardiovascular: Normal rate, regular rhythm. Grossly normal heart  sounds.  Good peripheral circulation. Respiratory: Normal respiratory effort.  No retractions. Lungs CTAB. Gastrointestinal: Soft and nontender. No distention. No abdominal bruits. No CVA tenderness. Genitourinary: Deferred Musculoskeletal: No obvious lumbar deformity.  Patient sits and stands with evidence on the extremity.  In a supine position patient is normal hip flexion extension.  Patient has moderate guarding palpation of the SI joint. Neurologic:  Normal speech and language. No gross focal neurologic deficits are appreciated. No gait instability. Skin:  Skin is warm, dry and intact. No rash noted. Psychiatric: Mood and affect are normal. Speech and behavior are normal.  ____________________________________________   LABS (all labs ordered are listed, but only abnormal results are displayed)  Labs Reviewed - No data to display ____________________________________________  EKG   ____________________________________________  RADIOLOGY I, Sable Feil, personally viewed and evaluated these images (  plain radiographs) as part of my medical decision making, as well as reviewing the written report by the radiologist.  ED MD interpretation: Patient has old compression fracture at L2 and multilevel DJD of the lumbar spine.  Official radiology report(s): DG Lumbar Spine Complete  Result Date: 11/16/2019 CLINICAL DATA:  Acute low back and left leg pain after injury last week. EXAM: LUMBAR SPINE - COMPLETE 4+ VIEW COMPARISON:  None. FINDINGS: Old L2 compression fracture is noted. No acute fracture or spondylolisthesis is noted. Moderate degenerative disc disease is noted at L1-2 and L2-3. IMPRESSION: Negative. Electronically Signed   By: Marijo Conception M.D.   On: 11/16/2019 12:20    ____________________________________________   PROCEDURES  Procedure(s) performed (including Critical Care):  Procedures   ____________________________________________   INITIAL IMPRESSION /  ASSESSMENT AND PLAN / ED COURSE  As part of my medical decision making, I reviewed the following data within the [place]         Patient presents with increasing low back pain for 4 days.  Discussed x-ray findings with patient showed old compression fracture multilevel degenerative disc disease.  Patient given discharge care instruction advised follow orthopedic for definitive evaluation and treatment.      ____________________________________________   FINAL CLINICAL IMPRESSION(S) / ED DIAGNOSES  Final diagnoses:  Closed compression fracture of L2 lumbar vertebra, initial encounter ([place])  Osteoarthritis of lumbar spine with myelopathy     ED Discharge Orders         Ordered    oxyCODONE-acetaminophen (PERCOCET) 7.5-325 MG tablet  Every 6 hours PRN        11/16/19 1236    orphenadrine (NORFLEX) 100 MG tablet  2 times daily        11/16/19 1236          *Please note:  Amber Aguilar was evaluated in Emergency Department on 11/16/2019 for the symptoms described in the history of present illness. She was evaluated in the context of the global COVID-19 pandemic, which necessitated consideration that the patient might be at risk for infection with the SARS-CoV-2 virus that causes COVID-19. Institutional protocols and algorithms that pertain to the evaluation of patients at risk for COVID-19 are in a state of rapid change based on information released by regulatory bodies including the CDC and federal and state organizations. These policies and algorithms were followed during the patient's care in the ED.  Some ED evaluations and interventions may be delayed as a result of limited staffing during and the pandemic.*   Note:  This document was prepared using Dragon voice recognition software and may include unintentional dictation errors.    Sable Feil, PA-C 11/16/19 1244    Naaman Plummer, MD 11/16/19 [phone]

## 2019-11-16 NOTE — ED Triage Notes (Signed)
Pt arrived via POV . Pt c/o L sided back pain that radiates down L leg at this time.   Pt states it started Friday and she was seen here in ED, prescribed medication, no relief.

## 2019-11-21 ENCOUNTER — Other Ambulatory Visit: Payer: Self-pay | Admitting: Physician Assistant

## 2019-11-21 DIAGNOSIS — M5416 Radiculopathy, lumbar region: Secondary | ICD-10-CM

## 2019-11-28 DIAGNOSIS — M5442 Lumbago with sciatica, left side: Secondary | ICD-10-CM | POA: Diagnosis not present

## 2019-11-28 DIAGNOSIS — G8929 Other chronic pain: Secondary | ICD-10-CM | POA: Diagnosis not present

## 2019-12-07 ENCOUNTER — Ambulatory Visit
Admission: RE | Admit: 2019-12-07 | Discharge: 2019-12-07 | Disposition: A | Discharge status: Home / Self Care | Payer: 59 | Source: Ambulatory Visit | Attending: Physician Assistant | Admitting: Physician Assistant

## 2019-12-07 ENCOUNTER — Other Ambulatory Visit: Payer: Self-pay

## 2019-12-07 DIAGNOSIS — M5416 Radiculopathy, lumbar region: Secondary | ICD-10-CM

## 2019-12-09 ENCOUNTER — Other Ambulatory Visit (HOSPITAL_COMMUNITY): Payer: Self-pay | Admitting: Physical Medicine & Rehabilitation

## 2019-12-09 DIAGNOSIS — M5416 Radiculopathy, lumbar region: Secondary | ICD-10-CM

## 2019-12-13 ENCOUNTER — Other Ambulatory Visit: Payer: Self-pay | Admitting: Family Medicine

## 2019-12-16 ENCOUNTER — Other Ambulatory Visit: Payer: Self-pay | Admitting: Family Medicine

## 2019-12-16 ENCOUNTER — Ambulatory Visit: Admission: RE | Admit: 2019-12-16 | Payer: 59 | Source: Ambulatory Visit

## 2019-12-19 ENCOUNTER — Other Ambulatory Visit: Payer: Self-pay

## 2019-12-19 ENCOUNTER — Ambulatory Visit: Payer: 59 | Attending: Physician Assistant

## 2019-12-19 DIAGNOSIS — M6281 Muscle weakness (generalized): Secondary | ICD-10-CM | POA: Diagnosis not present

## 2019-12-19 DIAGNOSIS — M5416 Radiculopathy, lumbar region: Secondary | ICD-10-CM | POA: Diagnosis not present

## 2019-12-19 DIAGNOSIS — M545 Low back pain, unspecified: Secondary | ICD-10-CM | POA: Diagnosis not present

## 2019-12-19 DIAGNOSIS — R262 Difficulty in walking, not elsewhere classified: Secondary | ICD-10-CM | POA: Insufficient documentation

## 2019-12-19 NOTE — Patient Instructions (Addendum)
Seated hip extension isometrics   Sitting on a chair,    Squeeze your rear end muscles together and press your L foot onto the floor.     Hold for 5 seconds    Repeat 10 times   Perform 3 sets daily.      This is a corrective exercise. Once you no longer have symptoms, you can stop.     Access Code: [email or web address] URL: [email or web address] Date: 12/19/2019 Prepared by: Joneen Boers  Exercises Seated Transversus Abdominis Bracing - 5 x daily - 7 x weekly - 3 sets - 10 reps - 5 seconds hold

## 2019-12-19 NOTE — Progress Notes (Signed)
Anesthesia Chart Review: Amber Aguilar    Case: [id number] Date/Time: 12/27/19 0745   Procedure: MRI LUMBAR SPINE W/O CONTRAST WITH ANESTHESIA (N/A )   Anesthesia type: General   Pre-op diagnosis: LUMBAR RADICULOPATHY   Location: [facility] OR RADIOLOGY ROOM / [place] [facility]   Surgeons: Radiologist, Medication, MD      DISCUSSION: Patient is a 64 year old female scheduled for MRI L-spine under anesthesia. MRI was ordered by Girtha Hake, MD (PM&R, [facility]). There is an H&P scanned under Media tab, Radiology order that is dated 12/13/19. She had two ED visits in October for back pain.  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 COVID-19 PNA (+ [facility]/04/19 at [facility], confirmed 01/14/19 at [facility]; admitted 01/14/19-01/15/19; 01/22/19-01/29/19 with PNA/hypoxemia s/p steroids, remdesivir, empiric antibiotics, high flow O2 weaned by D/C).  She was stable from a pulmonary standpoint at her 09/2019 pulmonology visit. Currently on Cellcept for RA with associated ILD. Also followed by rheumatology. She has been on rituximab in the past, but was on hold until after receiving COVID-19 vaccines. 2nd Moderna COVID-19 vaccine 10/11/19. Pre-procedure COVID-19 testing is scheduled for 11/22/19.   Anesthesia team to evaluate on the day of procedure.     VS:   Wt Readings from Last 3 Encounters:  11/16/19 88.5 kg  11/13/19 87.5 kg  01/23/19 87.9 kg   BP Readings from Last 3 Encounters:  11/16/19 (!) 138/59  11/13/19 (!) 141/60  01/29/19 (!) 109/50   Pulse Readings from Last 3 Encounters:  11/16/19 66  11/13/19 65  01/29/19 87    PROVIDERS: Patient, No Pcp Per  Blima Dessert, MD is rheumatologist ([place]). Telemedicine visit 08/22/19. Resuming of rituximab will be considered after she completes COVID-19 vaccine series (second received 10/11/19).  Daphane Shepherd, MD is pulmonologist ([place]). Visit 10/10/19. Refills of albuterol and mycophenolate given, continue mycophenolate with plans to reassess after getting back on rituximab infusions. Plan for repeat CTA in ~ 3 months and compare to CT obtained during COVID-19 infection, but "very reassuring from a symptom perspective" and no exertional hypoxemia during walk study.    LABS: Last lab results found are from 10/10/19 [facility]) and show normal BNP of 5; WBC 6.9, H/H 13.2/40.9, PLT 421. A1c 6.9% on 07/15/19. As of 02/08/19, Cr 0.73.    OTHER:  Pulmonary studies as outlined in 10/10/19 [facility] Pulmonology note:  Spirometry 10/10/19: - 10/10/19 2.13 (87%) 1.84 (94%) Not tested 86 51%  - Spirometry is normal. No evidence of obstructive ventilatory defect. Inspiratory limb of Flow-volume loop demonstrates normal forced inspiratory flows. - DLCO is moderately reduced.  - Compared to prior testing, spirometry is stable from 2019-2017. Compared to prior testing, DLCO is stable from 2017 onward.   6MWT 10/10/19: Walked 318 m, no exertional hypoxemia  Pertinent Laboratory Data: 10/12/13 elevated RF (488.6) 10/12/13 ANA of 1:320 and 1:160 10/14/13 C3 125, C4 23 02/07/15 Positive CCP (25) 02/06/14 low titer dsDNA (1:10)  04/03/14 dsDNA negative  11/06/16 ANCA IFA positive but negative MPO and PR3 Elisa Intermittent Eosinophilia (0.5 on 10/08/18, 0.8 on 04/27/18)   IMAGES: 1V PCXR 01/22/19 (in setting of COVID PNA): IMPRESSION: 1. Interval worsening of lung aeration with increased bilateral airspace lung opacities. Findings consistent with worsened COVID-19 pneumonia.  CTA Chest 01/14/19 (in setting of COVID PNA): IMPRESSION: 1. No evidence for pulmonary embolus. 2. Patchy peripheral airspace opacities bilaterally compatible with  COVID-19 pneumonia. 3. Cardiomegaly without failure. 4. Hepatic steatosis   EKG: 01/22/20 (in setting of COVID PNA) Sinus tachycardia at 123 bpm Probable inferior infarct, old Baseline wander in  lead(s) I II III aVR aVL aVF V2 V3 V4 V5 V6 Confirmed by Belinda Fisher [phone]) on 01/24/2019 3:55:44 PM   CV: Echo 09/25/14 [facility] CE): - Left Ventricle: The left ventricle is normal in size with mildly increased wall thickness and normal to hyperdynamic contraction. The visually estimated left ventricular ejection fraction is 65-70%. Diastolic transmitral flow profile and mitral annular tissue Doppler signal characteristic of normal left ventricular relaxation and chamber compliance. - Mitral Valve: The mitral valve is structurally normal.  There is no echographic evidence of pathologic mitral regurgitation by color-flow or continuous-wave Doppler imaging. - Left Atrium: The left atrium is normal in size. - Aortic Valve: Suboptimally imaged aortic valve is probably trileaflet with normal excursion.  No echocardiographic evidence of aortic regurgitation by color flow or continuous wave Doppler examinations.There is no evidence of hemodynamically important aortic transvalvular gradient; the maximal instantaneous left ventricular outflow velocity is 1.2 m/s.  - Aorta: The ascending thoracic aorta is normal in diameter at the level of the left ventricular outflow tract and sinuses of Valsalva and sinotubular junction; the transverse arch is suboptimally imaged. - Pulmonary Artery: The pulmonary artery is suboptimally imaged.  - Pulmonic Valve:The pulmonary valve is suboptimally imaged. There is no detectable pulmonary regurgitation by color flow Doppler imaging. The Doppler signal is of suboptimal technical quality; the right ventricular outflow tract gradient cannot be precisely estimated from this examination but peak jet velocity is probably in the range of 1.2 m/s.  - Right Ventricle: There is normal right ventricular chamber size and contraction.   - Tricuspid Valve: The tricuspid valve is structurally normal. There is trivial (probably physiologic)  tricuspid regurgitation by color flow and continuous wave Doppler imaging. The signal is of suboptimal technical quality; right ventricular and pulmonary arterial systolic pressure cannot be accurately estimated from this examination.  - Right Atrium: The right atrium is normal in size.  - Inferior Vena Cava: The inferior vena cava is suboptimally imaged.  - Pericardium: There is no evidence of pericardial effusion.   Interpretation:  Left ventricular hypertrophy  Normal left ventricular ejection fraction (65-70%)  Normal right ventricular contractile performance  (Comparison 11/11/13 echo: LVEF 55-60%, impaired LV relaxation with elevated filling pressures)    Past Medical History:  Diagnosis Date  . Arthritis    rheumatoid  . CHF (congestive heart failure) ([place])   . COPD (chronic obstructive pulmonary disease) ([place])   . Diabetes mellitus without complication [facility])     Past Surgical History:  Procedure Laterality Date  . CARPAL TUNNEL RELEASE      MEDICATIONS: No current facility-administered medications for this encounter.   Marland Kitchen acetaminophen (TYLENOL) 650 MG CR tablet  . albuterol (VENTOLIN HFA) 108 (90 Base) MCG/ACT inhaler  . aspirin EC 81 MG tablet  . atorvastatin (LIPITOR) 10 MG tablet  . fluticasone (FLONASE) 50 MCG/ACT nasal spray  . gabapentin (NEURONTIN) 300 MG capsule  . hydrochlorothiazide (HYDRODIURIL) 25 MG tablet  . meloxicam (MOBIC) 7.5 MG tablet  . metFORMIN (GLUCOPHAGE) 1000 MG tablet  . Multiple Vitamin (MULTI-VITAMIN) tablet  . mycophenolate (CELLCEPT) 500 MG tablet  . omeprazole (PRILOSEC) 20 MG capsule  . vitamin B-12 (CYANOCOBALAMIN) 1000 MCG tablet  . vitamin C (VITAMIN C) 500 MG tablet  . acetaminophen (TYLENOL) 500 MG tablet  . cyclobenzaprine (FLEXERIL) 5 MG tablet  .  ketorolac (TORADOL) 10 MG tablet  . orphenadrine (NORFLEX) 100 MG tablet  . oxyCODONE-acetaminophen (PERCOCET) 7.5-325 MG tablet  . zinc sulfate 220 (50 Zn)  MG capsule   Per 12/19/19 medication list, she is not currently taking Norflex, Percocet, Toradol, Flexeril, zinc sulfate.   Myra Gianotti, PA-C Surgical Short Stay/Anesthesiology [facility] Phone [phone] [facility] Phone [phone] 12/19/2019 4:22 PM

## 2019-12-19 NOTE — Anesthesia Preprocedure Evaluation (Addendum)
Anesthesia Evaluation  Patient identified by MRN, date of birth, ID band Patient awake    Reviewed: Allergy & Precautions, NPO status , Patient's Chart, lab work & pertinent test results  History of Anesthesia Complications (+) MALIGNANT HYPERTHERMIA  Airway Mallampati: II  TM Distance: >3 FB     Dental   Pulmonary pneumonia, COPD,    breath sounds clear to auscultation       Cardiovascular +CHF   Rhythm:Regular Rate:Normal  History noted CG   Neuro/Psych    GI/Hepatic negative GI ROS, Neg liver ROS,   Endo/Other  diabetes  Renal/GU      Musculoskeletal   Abdominal   Peds  Hematology   Anesthesia Other Findings   Reproductive/Obstetrics                           Anesthesia Physical Anesthesia Plan  ASA: III  Anesthesia Plan: General   Post-op Pain Management:    Induction: Intravenous  PONV Risk Score and Plan: 3 and Ondansetron, Dexamethasone and Midazolam  Airway Management Planned: LMA  Additional Equipment:   Intra-op Plan:   Post-operative Plan: Extubation in OR  Informed Consent: I have reviewed the patients History and Physical, chart, labs and discussed the procedure including the risks, benefits and alternatives for the proposed anesthesia with the patient or authorized representative who has indicated his/her understanding and acceptance.     Dental advisory given  Plan Discussed with: CRNA and Anesthesiologist  Anesthesia Plan Comments: (PAT note written 12/19/2019 by Myra Gianotti, PA-C. )       Anesthesia Quick Evaluation

## 2019-12-19 NOTE — Therapy (Signed)
[place] [facility] [address], [place], [address] Phone: [phone]   Fax:  [phone]  Physical Therapy Evaluation  Patient Details  Name: Amber Aguilar MRN: [id number] Date of Birth: Feb 19, 1955 Referring Provider (PT): Vance Peper, [place]   Encounter Date: 12/19/2019   PT End of Session - 12/19/19 1436    Visit Number 1    Number of Visits 17    Date for PT Re-Evaluation 02/16/20    PT Start Time 4235    PT Stop Time 1532    PT Time Calculation (min) 56 min    Activity Tolerance Patient tolerated treatment well    Behavior During Therapy [facility] for tasks assessed/performed           Past Medical History:  Diagnosis Date  . Arthritis    rheumatoid  . CHF (congestive heart failure) ([place])   . COPD (chronic obstructive pulmonary disease) ([place])   . Diabetes mellitus without complication [facility])     Past Surgical History:  Procedure Laterality Date  . CARPAL TUNNEL RELEASE      There were no vitals filed for this visit.    Subjective Assessment - 12/19/19 1442    Subjective L low back: 9/10 currently (pt sitting on a chair, shifted to the R), 17/10 at most for the past 2 weeks.    Pertinent History Lumbar radiculopathy. Nov 11, 2019, back pain started. Pt turned around to the R and felt a pull on her L low back. Pain also traveled down her L LE along the L 5 dermatome to her ankle. When her L LE hurts at night, when her pain is real bad, L toes (2-5 get numb). MRI is not until 12/27/2019. Denies loss of bowel or bladder control, or saddle anesthesia.  Currenly workes for [employer] in [employer] and is on her feet all day long.    Patient Stated Goals Decrease pain.    Currently in Pain? Yes    Pain Score 9     Pain Location Back    Pain Orientation Left;Posterior;Lower    Pain Descriptors / Indicators Pins and needles;Sharp    Pain Type Acute pain    Pain Radiating Towards L L5 dermatome to ankle    Pain Onset 1 to 4  weeks ago    Pain Frequency Constant    Aggravating Factors  Standing up from a chair and walking, reaching up, walking, standing, supine, L S/L    Pain Relieving Factors Using the rolling walker takes the edge off. R weight shifting, R S/L              [facility] PT Assessment - 12/19/19 1437      Assessment   Medical Diagnosis Lumbar radiculopathy    Referring Provider (PT) Vance Peper, PA    Onset Date/Surgical Date 11/21/19    Prior Therapy No known PT for current condition      Precautions   Precaution Comments Old L2 compression fracture      Restrictions   Other Position/Activity Restrictions no known restrictions      Balance Screen   Has the patient fallen in the past 6 months No    Has the patient had a decrease in activity level because of a fear of falling?  No    Is the patient reluctant to leave their home because of a fear of falling?  No      Home Environment   Additional Comments Pt  lives in a 1 story home with husband. 6 steps to enter front door B rail. Pt also uses a stick to walk up stairs with.       Prior Function   Vocation Full time employment   Dietary department     Observation/Other Assessments   Focus on Therapeutic Outcomes (FOTO)  Lumbar FOTO: 22      Posture/Postural Control   Posture Comments protracted neck, R lateral shift, R shoulder lower,  R iliac crest lower, R weight shifting, L knee bent, movement crease around L4/L5. L lumbar rotation      AROM   Right Hip Extension --    Right Hip ABduction --    Left Hip Extension --    Left Hip ABduction --    Lumbar Flexion WFL (felt good in flexion)    Lumbar Extension limited with L low back pain    Lumbar - Right Side Bend WFL     Lumbar - Left Side Bend Limited with pain    Lumbar - Right Rotation WFL with L low back pain    Lumbar - Left Rotation WFL with L low back pain > R rotation      Strength   Right Hip Flexion 4-/5    Right Hip Extension 4/5   seated manually resisted   Right  Hip ABduction 4/5   seated manually resisted clamshell   Left Hip Flexion 4/5    Left Hip Extension 4+/5   seated manually resisted   Left Hip ABduction 4/5   seated manually resisted clamshell   Right Knee Flexion 5/5    Right Knee Extension 5/5    Left Knee Flexion 4+/5    Left Knee Extension 4+/5      Palpation   Palpation comment TTP low back; muscle tension R lumbar paraspinal > L. TTP L PSIS      Ambulation/Gait   Gait Comments antalgic, decreased stance L LE, R lateral lean.                       Objective measurements completed on examination: See above findings.   No latex band allergy DM controlled.  No BP problems   R lumbar paraspinal muscle tension   trunk flexion preference seated R side bend feels better  Medbridge Access Code [email or web address]   Old L2 compression fracture   Therapeutic exercise  Seated L hip extension isometrics 10x3 with 5 second holds   Decreased R lateral shift posture in sitting   Seated transversus abdominis contraction 10x2 with 5 second holds  Decreased lumbar paraspinal muscte tension palpated  Gait with glute max squeeze   Improved exercise technique, movement at target joints, use of target muscles after mod verbal, visual, tactile cues.   Response to treatment 6/10 L low back pain at end of session    FOTO Lumbar 22   Clinicial impression Pt is a 64 year old female who came to physical therapy secondary to L low back pain with radiating symptoms along the L 5 dermatome. She also presents with altered gait pattern and posture, lumbar paraspinal muscle tension, trunk and B hip weakness, flexion preference, reproduction of symptoms with lumbar extension, L side bending and B rotation and difficulty performing transfers, ambulation and standing tasks secondary to pain. Pt will benefit from skilled physical therapy services to address the aforementioned deficits.        PT Education - 12/19/19 1542    Education  Details ther-ex,  HEP, plan of care    Person(s) Educated Patient    Methods Explanation;Demonstration;Tactile cues;Verbal cues;Handout    Comprehension Verbalized understanding;Returned demonstration            PT Short Term Goals - 12/19/19 1546      PT SHORT TERM GOAL #1   Title Pt will be independent with her initial HEP to decrease pain, improve strength and function.    Baseline Pt has started her HEP (12/19/2019)    Time 3    Period Weeks    Status New    Target Date 01/12/20             PT Long Term Goals - 12/19/19 1547      PT LONG TERM GOAL #1   Title Patient will have a decrease in low back pain to 5/10 or less at worst to promote ability to perform transfers, ambulate, perform standing tasks more comfortably.    Baseline 17/10 low back pain at most for the past 2 weeks (12/19/2019)    Time 8    Period Weeks    Status New    Target Date 02/16/20      PT LONG TERM GOAL #2   Title Patient will improve bilateral hip strength by at least 1/2 MMT grade to promote ability to perform transfers, ambulate, and standing tasks more comfortably.    Time 8    Period Weeks    Status New    Target Date 02/16/20      PT LONG TERM GOAL #3   Title Pt will improve her Lumbar FOTO score by at least 10 points as a demonstration of improved function.    Baseline Lumbar FOTO: 22 (12/19/2019)    Time 8    Period Weeks    Status New    Target Date 02/16/20                  Plan - 12/19/19 1542    Clinical Impression Statement Pt is a 64 year old female who came to physical therapy secondary to L low back pain with radiating symptoms along the L 5 dermatome. She also presents with altered gait pattern and posture, lumbar paraspinal muscle tension, trunk and B hip weakness, flexion preference, reproduction of symptoms with lumbar extension, L side bending and B rotation and difficulty performing transfers, ambulation and standing tasks secondary to pain. Pt will benefit from  skilled physical therapy services to address the aforementioned deficits.    Personal Factors and Comorbidities Age;Comorbidity 3+;Fitness    Comorbidities CHF, COPD, DM, arhtritis    Examination-Activity Limitations Bathing;Dressing;Sit;Transfers;Bed Mobility;Sleep;Lift;Carry;Reach Overhead;Stand    Stability/Clinical Decision Making Evolving/Moderate complexity   Pain seems worse since onset based on subjective   Clinical Decision Making Moderate    Rehab Potential Fair    PT Frequency 2x / week    PT Duration 8 weeks    PT Treatment/Interventions Therapeutic exercise;Neuromuscular re-education;Therapeutic activities;Functional mobility training;Gait training;Stair training;Patient/family education;Manual techniques;Dry needling;Aquatic Therapy;Electrical Stimulation;Iontophoresis 4mg /ml Dexamethasone;Traction    PT Next Visit Plan glute, trunk strengthening, posture, thoracic extension, manual techniques, modalities prn    PT Home Exercise Plan Medbridge Access Code [email or web address]    Consulted and Agree with Plan of Care Patient           Patient will benefit from skilled therapeutic intervention in order to improve the following deficits and impairments:  Pain, Postural dysfunction, Improper body mechanics, Difficulty walking, Decreased strength, Decreased range of motion  Visit Diagnosis: Acute left-sided  low back pain, unspecified whether sciatica present - Plan: PT plan of care cert/re-cert  Radiculopathy, lumbar region - Plan: PT plan of care cert/re-cert  Muscle weakness (generalized) - Plan: PT plan of care cert/re-cert  Difficulty in walking, not elsewhere classified - Plan: PT plan of care cert/re-cert     Problem List Patient Active Problem List   Diagnosis Date Noted  . Acute hypoxemic respiratory failure due to severe acute respiratory syndrome coronavirus 2 (SARS-CoV-2) disease ([place]) 01/23/2019  . Pneumonia due to COVID-19 virus 01/22/2019  . Acute hypoxemic respiratory  failure ([place]) 01/22/2019  . Rheumatoid arthritis ([place]) 01/17/2019  . Interstitial lung disease ([place]) 01/17/2019  . Diabetes mellitus ([place])   . Acute respiratory failure due to COVID-19 [facility]) 01/14/2019    Joneen Boers PT, DPT   12/19/2019, 4:03 PM  [place] [facility] [address], [place], [address] Phone: [phone]   Fax:  [phone]  Name: GUILLERMO DIFRANCESCO MRN: [id number] Date of Birth: 23-Apr-1955

## 2019-12-20 ENCOUNTER — Ambulatory Visit (HOSPITAL_COMMUNITY): Payer: 59

## 2019-12-20 DIAGNOSIS — M5416 Radiculopathy, lumbar region: Secondary | ICD-10-CM

## 2019-12-21 ENCOUNTER — Ambulatory Visit: Payer: 59

## 2019-12-23 ENCOUNTER — Other Ambulatory Visit: Payer: 59

## 2019-12-23 ENCOUNTER — Other Ambulatory Visit
Admission: RE | Admit: 2019-12-23 | Discharge: 2019-12-23 | Disposition: A | Discharge status: Home / Self Care | Payer: 59 | Source: Ambulatory Visit | Attending: Physical Medicine & Rehabilitation | Admitting: Physical Medicine & Rehabilitation

## 2019-12-23 ENCOUNTER — Other Ambulatory Visit: Payer: Self-pay

## 2019-12-23 DIAGNOSIS — Z20822 Contact with and (suspected) exposure to covid-19: Secondary | ICD-10-CM | POA: Insufficient documentation

## 2019-12-23 DIAGNOSIS — Z01812 Encounter for preprocedural laboratory examination: Secondary | ICD-10-CM | POA: Diagnosis not present

## 2019-12-24 LAB — SARS CORONAVIRUS 2 (TAT 6-24 HRS): SARS Coronavirus 2: NEGATIVE

## 2019-12-26 ENCOUNTER — Encounter (HOSPITAL_COMMUNITY): Payer: Self-pay | Admitting: *Deleted

## 2019-12-26 ENCOUNTER — Other Ambulatory Visit: Payer: Self-pay

## 2019-12-26 NOTE — Progress Notes (Signed)
Patient was given pre-op instructions over the phone. The opportunity was given for the patient to ask questions. No further questions asked. Patient verbalized understanding of instructions given.   Nothing to eat or drink after midnight.  7 days prior to surgery STOP taking any , Aleve, Naproxen, Ibuprofen, Motrin, Advil, Goody's, BC's, all herbal medications, fish oil, and all vitamins.   Anesthesia Review Needed: yes spoke with james  Chest x-ray - 01/18/19 EKG - 01/22/19 Stress Test - denies ECHO - denies Cardiac Cath -denies    Fasting Blood Sugar - 90s Checks Blood Sugar __1___ times a day  Aspirin Instructions: continue   COVID TEST- COVID negative on 12/23/19   Patient denies shortness of breath, fever, cough and chest pain at PAT appointment   All instructions explained to the patient, with a verbal understanding of the material. Patient agrees to go over the instructions while at home for a better understanding. Patient also instructed to self quarantine after being tested for COVID-19. The opportunity to ask questions was provided.

## 2019-12-27 ENCOUNTER — Telehealth: Payer: Self-pay

## 2019-12-27 ENCOUNTER — Ambulatory Visit (HOSPITAL_COMMUNITY): Payer: 59 | Admitting: Vascular Surgery

## 2019-12-27 ENCOUNTER — Encounter (HOSPITAL_COMMUNITY): Payer: Self-pay

## 2019-12-27 ENCOUNTER — Ambulatory Visit: Payer: 59

## 2019-12-27 ENCOUNTER — Ambulatory Visit (HOSPITAL_COMMUNITY)
Admission: RE | Admit: 2019-12-27 | Discharge: 2019-12-27 | Disposition: A | Discharge status: Home / Self Care | Payer: 59 | Source: Ambulatory Visit | Attending: Physical Medicine & Rehabilitation | Admitting: Physical Medicine & Rehabilitation

## 2019-12-27 ENCOUNTER — Encounter (HOSPITAL_COMMUNITY): Admission: RE | Disposition: A | Discharge status: Home / Self Care | Payer: Self-pay | Source: Home / Self Care

## 2019-12-27 ENCOUNTER — Ambulatory Visit (HOSPITAL_COMMUNITY)
Admission: RE | Admit: 2019-12-27 | Discharge: 2019-12-27 | Disposition: A | Discharge status: Home / Self Care | Payer: 59 | Attending: Physical Medicine & Rehabilitation | Admitting: Physical Medicine & Rehabilitation

## 2019-12-27 DIAGNOSIS — E119 Type 2 diabetes mellitus without complications: Secondary | ICD-10-CM | POA: Diagnosis not present

## 2019-12-27 DIAGNOSIS — J849 Interstitial pulmonary disease, unspecified: Secondary | ICD-10-CM | POA: Diagnosis not present

## 2019-12-27 DIAGNOSIS — M5136 Other intervertebral disc degeneration, lumbar region: Secondary | ICD-10-CM | POA: Insufficient documentation

## 2019-12-27 DIAGNOSIS — Z8616 Personal history of COVID-19: Secondary | ICD-10-CM | POA: Diagnosis not present

## 2019-12-27 DIAGNOSIS — I5030 Unspecified diastolic (congestive) heart failure: Secondary | ICD-10-CM | POA: Insufficient documentation

## 2019-12-27 DIAGNOSIS — M5416 Radiculopathy, lumbar region: Secondary | ICD-10-CM | POA: Diagnosis not present

## 2019-12-27 DIAGNOSIS — M0510 Rheumatoid lung disease with rheumatoid arthritis of unspecified site: Secondary | ICD-10-CM | POA: Insufficient documentation

## 2019-12-27 DIAGNOSIS — M47819 Spondylosis without myelopathy or radiculopathy, site unspecified: Secondary | ICD-10-CM | POA: Diagnosis not present

## 2019-12-27 DIAGNOSIS — M4856XA Collapsed vertebra, not elsewhere classified, lumbar region, initial encounter for fracture: Secondary | ICD-10-CM | POA: Diagnosis not present

## 2019-12-27 DIAGNOSIS — M069 Rheumatoid arthritis, unspecified: Secondary | ICD-10-CM | POA: Diagnosis not present

## 2019-12-27 DIAGNOSIS — I509 Heart failure, unspecified: Secondary | ICD-10-CM | POA: Diagnosis not present

## 2019-12-27 DIAGNOSIS — J449 Chronic obstructive pulmonary disease, unspecified: Secondary | ICD-10-CM | POA: Diagnosis not present

## 2019-12-27 DIAGNOSIS — M545 Low back pain, unspecified: Secondary | ICD-10-CM | POA: Diagnosis not present

## 2019-12-27 HISTORY — PX: RADIOLOGY WITH ANESTHESIA: SHX6223

## 2019-12-27 LAB — BASIC METABOLIC PANEL
Anion gap: 10 (ref 5–15)
BUN: 16 mg/dL (ref 8–23)
CO2: 30 mmol/L (ref 22–32)
Calcium: 10 mg/dL (ref 8.9–10.3)
Chloride: 100 mmol/L (ref 98–111)
Creatinine, Ser: 0.75 mg/dL (ref 0.44–1.00)
GFR, Estimated: >60 mL/min (ref >60)
Glucose, Bld: 130 mg/dL — ABNORMAL HIGH (ref 70–99)
Potassium: 4.2 mmol/L (ref 3.5–5.1)
Sodium: 140 mmol/L (ref 135–145)

## 2019-12-27 LAB — GLUCOSE, CAPILLARY
Glucose-Capillary: 135 mg/dL — ABNORMAL HIGH (ref 70–99)
Glucose-Capillary: 101 mg/dL — ABNORMAL HIGH (ref 70–99)

## 2019-12-27 SURGERY — MRI WITH ANESTHESIA
Anesthesia: General

## 2019-12-27 MED ORDER — ORAL CARE MOUTH RINSE: 15.0000 mL | Freq: Once | OROMUCOSAL | Status: AC

## 2019-12-27 MED ORDER — DEXAMETHASONE SODIUM PHOSPHATE 10 MG/ML IJ SOLN
INTRAMUSCULAR | Status: DC | PRN
  Administered 2019-12-27: 4 mg via INTRAVENOUS

## 2019-12-27 MED ORDER — PROPOFOL 10 MG/ML IV BOLUS
INTRAVENOUS | Status: DC | PRN
  Administered 2019-12-27: 180 mg via INTRAVENOUS

## 2019-12-27 MED ORDER — ONDANSETRON HCL 4 MG/2ML IJ SOLN
INTRAMUSCULAR | Status: DC | PRN
  Administered 2019-12-27: 4 mg via INTRAVENOUS

## 2019-12-27 MED ORDER — FENTANYL CITRATE (PF) 100 MCG/2ML IJ SOLN
INTRAMUSCULAR | Status: AC
  Filled 2019-12-27: qty 2

## 2019-12-27 MED ORDER — SUCCINYLCHOLINE CHLORIDE 20 MG/ML IJ SOLN
INTRAMUSCULAR | Status: DC | PRN
  Administered 2019-12-27: 100 mg via INTRAVENOUS

## 2019-12-27 MED ORDER — LIDOCAINE 2% (20 MG/ML) 5 ML SYRINGE
INTRAMUSCULAR | Status: DC | PRN
  Administered 2019-12-27: 100 mg via INTRAVENOUS

## 2019-12-27 MED ORDER — FENTANYL CITRATE (PF) 100 MCG/2ML IJ SOLN
INTRAMUSCULAR | Status: DC | PRN
  Administered 2019-12-27: 50 ug via INTRAVENOUS

## 2019-12-27 MED ORDER — LACTATED RINGERS IV SOLN: INTRAVENOUS | Status: DC

## 2019-12-27 MED ORDER — MIDAZOLAM HCL 5 MG/5ML IJ SOLN
INTRAMUSCULAR | Status: DC | PRN
  Administered 2019-12-27: 2 mg via INTRAVENOUS

## 2019-12-27 MED ORDER — MIDAZOLAM HCL 2 MG/2ML IJ SOLN
INTRAMUSCULAR | Status: AC
  Filled 2019-12-27: qty 2

## 2019-12-27 MED ORDER — CHLORHEXIDINE GLUCONATE 0.12 % MT SOLN
15.0000 mL | Freq: Once | OROMUCOSAL | Status: AC
  Administered 2019-12-27: 15 mL via OROMUCOSAL
  Filled 2019-12-27: qty 15

## 2019-12-27 NOTE — Transfer of Care (Signed)
Immediate Anesthesia Transfer of Care Note  Patient: Amber Aguilar  Procedure(s) Performed: MRI LUMBAR SPINE W/O CONTRAST WITH ANESTHESIA (N/A )  Patient Location: PACU  Anesthesia Type:General  Level of Consciousness: awake and alert   Airway & Oxygen Therapy: Patient Spontanous Breathing and Patient connected to nasal cannula oxygen  Post-op Assessment: Report given to RN and Post -op Vital signs reviewed and stable  Post vital signs: Reviewed and stable  Last Vitals:  Vitals Value Taken Time  BP 139/56 12/27/19 1001  Temp 36.9 C 12/27/19 0952  Pulse 81 12/27/19 1002  Resp 15 12/27/19 1002  SpO2 100 % 12/27/19 1002  Vitals shown include unvalidated device data.  Last Pain:  Vitals:   12/27/19 0952  TempSrc:   PainSc: Asleep      Patients Stated Pain Goal: 2 (83/66/29 4765)  Complications: No complications documented.

## 2019-12-27 NOTE — Anesthesia Procedure Notes (Signed)
Procedure Name: Intubation Date/Time: 12/27/2019 9:01 AM Performed by: Inda Coke, CRNA Pre-anesthesia Checklist: Patient identified, Emergency Drugs available, Suction available and Patient being monitored Patient Re-evaluated:Patient Re-evaluated prior to induction Oxygen Delivery Method: Circle System Utilized Preoxygenation: Pre-oxygenation with 100% oxygen Induction Type: IV induction Ventilation: Mask ventilation without difficulty Laryngoscope Size: Mac and 3 Grade View: Grade II Tube type: Oral Tube size: 7.5 mm Number of attempts: 1 Airway Equipment and Method: Stylet and Oral airway Placement Confirmation: ETT inserted through vocal cords under direct vision,  positive ETCO2 and breath sounds checked- equal and bilateral Secured at: 22 cm Tube secured with: Tape Dental Injury: Teeth and Oropharynx as per pre-operative assessment

## 2019-12-27 NOTE — Addendum Note (Signed)
Addendum  created 12/27/19 1134 by Inda Coke, CRNA   Flowsheet accepted, Intraprocedure Event edited, Intraprocedure Flowsheets edited

## 2019-12-27 NOTE — Progress Notes (Addendum)
Patient unable to remove (3) piercings.  Per patient, piercings are permanent and cannot be removed.    Radiology called and notified.  No orders given.  Rejeana Brock, CRNA, notified and aware.

## 2019-12-27 NOTE — Addendum Note (Signed)
Addendum  created 12/27/19 1143 by Inda Coke, CRNA   Flowsheet accepted, Intraprocedure Flowsheets edited

## 2019-12-27 NOTE — Anesthesia Postprocedure Evaluation (Signed)
Anesthesia Post Note  Patient: Amber Aguilar  Procedure(s) Performed: MRI LUMBAR SPINE W/O CONTRAST WITH ANESTHESIA (N/A )     Patient location during evaluation: PACU Anesthesia Type: General Level of consciousness: awake Pain management: pain level controlled Vital Signs Assessment: post-procedure vital signs reviewed and stable Respiratory status: spontaneous breathing Cardiovascular status: stable Postop Assessment: no apparent nausea or vomiting Anesthetic complications: no   No complications documented.  Last Vitals:  Vitals:   12/27/19 1015 12/27/19 1025  BP: 129/70 (!) 116/59  Pulse: 85   Resp: 17   Temp:    SpO2: 100%     Last Pain:  Vitals:   12/27/19 1025  TempSrc:   PainSc: 0-No pain                 [name]

## 2019-12-27 NOTE — Addendum Note (Signed)
Addendum  created 12/27/19 1152 by Inda Coke, CRNA   Flowsheet accepted, Intraprocedure Flowsheets edited

## 2019-12-27 NOTE — Telephone Encounter (Signed)
No show. Called patient who said that she was just waking up from the MRI earlier... they had to put her to sleep. Has been doing her exercises at home and her back is feeling much better. Will be able to make it to her next appointment next week.

## 2019-12-28 ENCOUNTER — Encounter (HOSPITAL_COMMUNITY): Payer: Self-pay | Admitting: Radiology

## 2019-12-30 DIAGNOSIS — M5442 Lumbago with sciatica, left side: Secondary | ICD-10-CM | POA: Diagnosis not present

## 2019-12-30 DIAGNOSIS — M48061 Spinal stenosis, lumbar region without neurogenic claudication: Secondary | ICD-10-CM | POA: Diagnosis not present

## 2019-12-30 DIAGNOSIS — E109 Type 1 diabetes mellitus without complications: Secondary | ICD-10-CM | POA: Diagnosis not present

## 2020-01-03 ENCOUNTER — Ambulatory Visit: Payer: 59

## 2020-01-10 DIAGNOSIS — M5442 Lumbago with sciatica, left side: Secondary | ICD-10-CM | POA: Diagnosis not present

## 2020-01-10 DIAGNOSIS — G8929 Other chronic pain: Secondary | ICD-10-CM | POA: Diagnosis not present

## 2020-01-10 DIAGNOSIS — M48061 Spinal stenosis, lumbar region without neurogenic claudication: Secondary | ICD-10-CM | POA: Diagnosis not present

## 2020-01-11 ENCOUNTER — Ambulatory Visit: Payer: 59 | Attending: Physician Assistant

## 2020-01-11 ENCOUNTER — Telehealth: Payer: Self-pay

## 2020-01-11 DIAGNOSIS — R262 Difficulty in walking, not elsewhere classified: Secondary | ICD-10-CM | POA: Insufficient documentation

## 2020-01-11 DIAGNOSIS — M6281 Muscle weakness (generalized): Secondary | ICD-10-CM | POA: Insufficient documentation

## 2020-01-11 DIAGNOSIS — M545 Low back pain, unspecified: Secondary | ICD-10-CM | POA: Insufficient documentation

## 2020-01-11 DIAGNOSIS — M5416 Radiculopathy, lumbar region: Secondary | ICD-10-CM | POA: Insufficient documentation

## 2020-01-11 NOTE — Telephone Encounter (Signed)
No show. Called patient who said that she had an injection and getting another injection this Thursday. Pt will be able to make it to her next appointment Monday 01/16/20 at 11:15 am. Pt was recommended to ask her doctor if there was any special instructions or restrictions with PT following the injection. Pt verbalized understanding. Pt was also informed of the multiple cancellations and no shows. Pt states that she wants to continue and will be able to make it to her next appointment. Pt has also been performing her home exercises.

## 2020-01-16 ENCOUNTER — Ambulatory Visit: Payer: 59

## 2020-01-16 ENCOUNTER — Telehealth: Payer: Self-pay

## 2020-01-16 NOTE — Telephone Encounter (Signed)
No show. Called pt who said that she thought that the appointment was at 11:45 am. Can make it to the next one. Pt was informed that due to the multiple no shows and cancellations and not calling us, we have to remove the rest of the appointments except for the next one and schedule sessions one appointment at a time. Pt verbalized understanding.

## 2020-01-18 ENCOUNTER — Other Ambulatory Visit: Payer: Self-pay

## 2020-01-18 ENCOUNTER — Ambulatory Visit: Payer: 59

## 2020-01-18 DIAGNOSIS — M6281 Muscle weakness (generalized): Secondary | ICD-10-CM

## 2020-01-18 DIAGNOSIS — M545 Low back pain, unspecified: Secondary | ICD-10-CM | POA: Diagnosis not present

## 2020-01-18 DIAGNOSIS — R262 Difficulty in walking, not elsewhere classified: Secondary | ICD-10-CM | POA: Diagnosis not present

## 2020-01-18 DIAGNOSIS — M5416 Radiculopathy, lumbar region: Secondary | ICD-10-CM | POA: Diagnosis not present

## 2020-01-18 NOTE — Therapy (Signed)
[facility] [address], [place], [address] Phone: [phone]   Fax:  [phone]  Physical Therapy Treatment  Patient Details  Name: Amber Aguilar MRN: [id number] Date of Birth: 05-10-1955 Referring Provider (PT): Vance Peper, [place]   Encounter Date: 01/18/2020   PT End of Session - 01/18/20 1121    Visit Number 2    Number of Visits 17    Date for PT Re-Evaluation 02/16/20    PT Start Time 1121    PT Stop Time 1153    PT Time Calculation (min) 32 min    Activity Tolerance Patient tolerated treatment well    Behavior During Therapy [facility] for tasks assessed/performed           Past Medical History:  Diagnosis Date   Arthritis    rheumatoid   CHF (congestive heart failure) (HCC)    COPD (chronic obstructive pulmonary disease) ([place])    Diabetes mellitus without complication ([place])     Past Surgical History:  Procedure Laterality Date   CARPAL TUNNEL RELEASE     RADIOLOGY WITH ANESTHESIA N/A 12/27/2019   Procedure: MRI LUMBAR SPINE W/O CONTRAST WITH ANESTHESIA;  Surgeon: Radiologist, Medication, MD;  Location: [facility];  Service: Radiology;  Laterality: N/A;    There were no vitals filed for this visit.   Subjective Assessment - 01/18/20 1122    Subjective Low back is doing much better, about 6.5/10 currently but she can walk better. The L LE symptoms are better too. Has another L low back injection tomorrow. Has been doing her HEP.  Wants to continue PT in the clinic.    Pertinent History Lumbar radiculopathy. Nov 11, 2019, back pain started. Pt turned around to the R and felt a pull on her L low back. Pain also traveled down her L LE along the L 5 dermatome to her ankle. When her L LE hurts at night, when her pain is real bad, L toes (2-5 get numb). MRI is not until 12/27/2019. Denies loss of bowel or bladder control, or saddle anesthesia.  Currenly workes for [employer] in [employer] and is on her feet all day  long.    Patient Stated Goals Decrease pain.    Currently in Pain? Yes    Pain Score 7    6.5/10   Pain Onset 1 to 4 weeks ago                                     PT Education - 01/18/20 1128    Education Details ther-ex    [employer]) Educated Patient    Methods Explanation;Demonstration;Tactile cues;Verbal cues    Comprehension Returned demonstration;Verbalized understanding            Objective   No latex band allergy DM controlled.  No BP problems   R lumbar paraspinal muscle tension  trunk flexion preference seated R side bend feels better  Medbridge Access Code [email or web address]   Old L2 compression fracture   Therapeutic exercise  Standing R lateral shift correction 10x3 with 5 second holds   Seated shoulder extension isometrics, hand on thigh  R 10x5 seconds for 3 sets. Difficulty with R scapular control with tendency for protraction   Decreased R lumbar paraspinal muscle tension palpated  seated manually resisted L lateral shift isometrics to counter R lateral shift posture 10x3 with 5 second holds  Decreased R lumbar paraspinal muscle tension palpated    Seated manually resisted R upper trunk rotation isometrics in neutral to promote L lumbar rotation to promote more neutral posture 10x2 with 5 second holds     Improved exercise technique, movement at target joints, use of target muscles after mod verbal, visual, tactile cues.     Response to treatment 5/10 L low back pain at end of session     Clinicial impression Pt making progress with decreased low back and L LE pain based on subjective reports.  Pt also demonstrates improved function based on her FOTO score. Continued working on decreasing R lateral shift as well as promoting more neutral lumbar posture and decreasing R lumbar paraspinal muscle tension to decrease stress to low back. Decreased low back pain reported by pt after session. Pt will benefit from  continued skilled physical therapy services to decrease pain, improve strength, posture, and function.       PT Short Term Goals - 12/19/19 1546      PT SHORT TERM GOAL #1   Title Pt will be independent with her initial HEP to decrease pain, improve strength and function.    Baseline Pt has started her HEP (12/19/2019)    Time 3    Period Weeks    Status New    Target Date 01/12/20             PT Long Term Goals - 01/18/20 1150      PT LONG TERM GOAL #1   Title Patient will have a decrease in low back pain to 5/10 or less at worst to promote ability to perform transfers, ambulate, perform standing tasks more comfortably.    Baseline 17/10 low back pain at most for the past 2 weeks (12/19/2019); 8/10 low back pain at most for the past 7 days (also stopped taking tylenol) 01/18/2020.    Time 8    Period Weeks    Status Partially Met    Target Date 02/16/20      PT LONG TERM GOAL #2   Title Patient will improve bilateral hip strength by at least 1/2 MMT grade to promote ability to perform transfers, ambulate, and standing tasks more comfortably.    Time 8    Period Weeks    Status On-going    Target Date 02/16/20      PT LONG TERM GOAL #3   Title Pt will improve her Lumbar FOTO score by at least 10 points as a demonstration of improved function.    Baseline Lumbar FOTO: 22 (12/19/2019); 36 (01/18/2020)    Time 8    Period Weeks    Status On-going    Target Date 02/16/20                 Plan - 01/18/20 1129    Clinical Impression Statement Pt making progress with decreased low back and L LE pain based on subjective reports.  Pt also demonstrates improved function based on her FOTO score. Continued working on decreasing R lateral shift as well as promoting more neutral lumbar posture and decreasing R lumbar paraspinal muscle tension to decrease stress to low back. Decreased low back pain reported by pt after session. Pt will benefit from continued skilled physical therapy  services to decrease pain, improve strength, posture, and function.    Personal Factors and Comorbidities Age;Comorbidity 3+;Fitness    Comorbidities CHF, COPD, DM, arhtritis    Examination-Activity Limitations Bathing;Dressing;Sit;Transfers;Bed Mobility;Sleep;Lift;Carry;Reach Overhead;Stand    Stability/Clinical  Decision Making Evolving/Moderate complexity   Pain seems worse since onset based on subjective   Rehab Potential Fair    PT Frequency 2x / week    PT Duration 8 weeks    PT Treatment/Interventions Therapeutic exercise;Neuromuscular re-education;Therapeutic activities;Functional mobility training;Gait training;Stair training;Patient/family education;Manual techniques;Dry needling;Aquatic Therapy;Electrical Stimulation;Iontophoresis 35m/ml Dexamethasone;Traction    PT Next Visit Plan glute, trunk strengthening, posture, thoracic extension, manual techniques, modalities prn    PT Home Exercise Plan Medbridge Access Code [email or web address]    Consulted and Agree with Plan of Care Patient           Patient will benefit from skilled therapeutic intervention in order to improve the following deficits and impairments:  Pain, Postural dysfunction, Improper body mechanics, Difficulty walking, Decreased strength, Decreased range of motion  Visit Diagnosis: Acute left-sided low back pain, unspecified whether sciatica present  Radiculopathy, lumbar region  Muscle weakness (generalized)  Difficulty in walking, not elsewhere classified     Problem List Patient Active Problem List   Diagnosis Date Noted   Acute hypoxemic respiratory failure due to severe acute respiratory syndrome coronavirus 2 (SARS-CoV-2) disease ([facility] 01/23/2019   Pneumonia due to COVID-19 virus 01/22/2019   Acute hypoxemic respiratory failure ([facility] 01/22/2019   Rheumatoid arthritis ([facility] 01/17/2019   Interstitial lung disease ([facility] 01/17/2019   Diabetes mellitus ([facility]    Acute respiratory failure due to COVID-19  ([facility] 01/14/2019    MJoneen BoersPT, DPT   01/18/2020, 12:08 PM  [facility] [address] [place] [address]Phone: [phone]  Fax:  [phone] Name: RSERYNA MAREKMRN: [id number] of Birth: 905-Jan-1957

## 2020-01-18 NOTE — Patient Instructions (Signed)
  Sitting on a chair    Press your hands on your thighs to feel your abdominal muscles contract.   Hold for 5 seconds comfortably.   Repeat 10 times.   Perform 3 sets daily.

## 2020-01-19 DIAGNOSIS — M48061 Spinal stenosis, lumbar region without neurogenic claudication: Secondary | ICD-10-CM | POA: Diagnosis not present

## 2020-01-19 DIAGNOSIS — E109 Type 1 diabetes mellitus without complications: Secondary | ICD-10-CM | POA: Diagnosis not present

## 2020-01-19 DIAGNOSIS — M5442 Lumbago with sciatica, left side: Secondary | ICD-10-CM | POA: Diagnosis not present

## 2020-01-24 ENCOUNTER — Ambulatory Visit: Payer: 59

## 2020-01-25 ENCOUNTER — Emergency Department: Payer: 59

## 2020-01-25 ENCOUNTER — Other Ambulatory Visit: Payer: Self-pay

## 2020-01-25 ENCOUNTER — Encounter: Payer: Self-pay | Admitting: Emergency Medicine

## 2020-01-25 ENCOUNTER — Emergency Department
Admission: EM | Admit: 2020-01-25 | Discharge: 2020-01-25 | Disposition: A | Discharge status: Home / Self Care | Payer: 59 | Attending: Emergency Medicine | Admitting: Emergency Medicine

## 2020-01-25 DIAGNOSIS — J449 Chronic obstructive pulmonary disease, unspecified: Secondary | ICD-10-CM | POA: Diagnosis not present

## 2020-01-25 DIAGNOSIS — Z79899 Other long term (current) drug therapy: Secondary | ICD-10-CM | POA: Insufficient documentation

## 2020-01-25 DIAGNOSIS — Z7982 Long term (current) use of aspirin: Secondary | ICD-10-CM | POA: Diagnosis not present

## 2020-01-25 DIAGNOSIS — Z7984 Long term (current) use of oral hypoglycemic drugs: Secondary | ICD-10-CM | POA: Insufficient documentation

## 2020-01-25 DIAGNOSIS — E119 Type 2 diabetes mellitus without complications: Secondary | ICD-10-CM | POA: Diagnosis not present

## 2020-01-25 DIAGNOSIS — R509 Fever, unspecified: Secondary | ICD-10-CM | POA: Diagnosis not present

## 2020-01-25 DIAGNOSIS — I509 Heart failure, unspecified: Secondary | ICD-10-CM | POA: Diagnosis not present

## 2020-01-25 DIAGNOSIS — J101 Influenza due to other identified influenza virus with other respiratory manifestations: Secondary | ICD-10-CM | POA: Insufficient documentation

## 2020-01-25 DIAGNOSIS — Z7951 Long term (current) use of inhaled steroids: Secondary | ICD-10-CM | POA: Diagnosis not present

## 2020-01-25 DIAGNOSIS — Z20822 Contact with and (suspected) exposure to covid-19: Secondary | ICD-10-CM | POA: Diagnosis not present

## 2020-01-25 DIAGNOSIS — J09X2 Influenza due to identified novel influenza A virus with other respiratory manifestations: Secondary | ICD-10-CM | POA: Diagnosis not present

## 2020-01-25 DIAGNOSIS — R059 Cough, unspecified: Secondary | ICD-10-CM | POA: Diagnosis not present

## 2020-01-25 DIAGNOSIS — R0789 Other chest pain: Secondary | ICD-10-CM | POA: Diagnosis not present

## 2020-01-25 LAB — URINALYSIS, COMPLETE (UACMP) WITH MICROSCOPIC
Bilirubin Urine: NEGATIVE
Glucose, UA: NEGATIVE mg/dL
Hgb urine dipstick: NEGATIVE
Ketones, ur: NEGATIVE mg/dL
Nitrite: NEGATIVE
Protein, ur: 30 mg/dL — AB
Specific Gravity, Urine: 1.026 (ref 1.005–1.030)
pH: 6 (ref 5.0–8.0)

## 2020-01-25 LAB — CBC WITH DIFFERENTIAL/PLATELET
Abs Immature Granulocytes: 0.03 10*3/uL (ref 0.00–0.07)
Basophils Absolute: 0.1 10*3/uL (ref 0.0–0.1)
Basophils Relative: 1 %
Eosinophils Absolute: 0 10*3/uL (ref 0.0–0.5)
Eosinophils Relative: 0 %
HCT: 36.8 % (ref 36.0–46.0)
Hemoglobin: 12.1 g/dL (ref 12.0–15.0)
Immature Granulocytes: 0 %
Lymphocytes Relative: 20 %
Lymphs Abs: 1.8 10*3/uL (ref 0.7–4.0)
MCH: 26.8 pg (ref 26.0–34.0)
MCHC: 32.9 g/dL (ref 30.0–36.0)
MCV: 81.6 fL (ref 80.0–100.0)
Monocytes Absolute: 1.4 10*3/uL — ABNORMAL HIGH (ref 0.1–1.0)
Monocytes Relative: 15 %
Neutro Abs: 5.7 10*3/uL (ref 1.7–7.7)
Neutrophils Relative %: 64 %
Platelets: 409 10*3/uL — ABNORMAL HIGH (ref 150–400)
RBC: 4.51 MIL/uL (ref 3.87–5.11)
RDW: 14.2 % (ref 11.5–15.5)
WBC: 9 10*3/uL (ref 4.0–10.5)
nRBC: 0 % (ref 0.0–0.2)

## 2020-01-25 LAB — BASIC METABOLIC PANEL
Anion gap: 10 (ref 5–15)
BUN: 10 mg/dL (ref 8–23)
CO2: 26 mmol/L (ref 22–32)
Calcium: 8.7 mg/dL — ABNORMAL LOW (ref 8.9–10.3)
Chloride: 102 mmol/L (ref 98–111)
Creatinine, Ser: 0.74 mg/dL (ref 0.44–1.00)
GFR, Estimated: >60 mL/min (ref >60)
Glucose, Bld: 161 mg/dL — ABNORMAL HIGH (ref 70–99)
Potassium: 3.4 mmol/L — ABNORMAL LOW (ref 3.5–5.1)
Sodium: 138 mmol/L (ref 135–145)

## 2020-01-25 LAB — RESP PANEL BY RT-PCR (FLU A&B, COVID) ARPGX2
Influenza A by PCR: POSITIVE — AB
Influenza B by PCR: NEGATIVE
SARS Coronavirus 2 by RT PCR: NEGATIVE

## 2020-01-25 LAB — TROPONIN I (HIGH SENSITIVITY): Troponin I (High Sensitivity): 5 ng/L (ref <18)

## 2020-01-25 MED ORDER — OSELTAMIVIR PHOSPHATE 75 MG PO CAPS: 75.0000 mg | ORAL_CAPSULE | Freq: Two times a day (BID) | ORAL | 0 refills | Status: AC

## 2020-01-25 MED ORDER — ACETAMINOPHEN 325 MG PO TABS
650.0000 mg | ORAL_TABLET | Freq: Once | ORAL | Status: AC | PRN
  Administered 2020-01-25: 650 mg via ORAL
  Filled 2020-01-25: qty 2

## 2020-01-25 NOTE — ED Notes (Signed)
PT given gingerale per request- okay with [place], PA

## 2020-01-25 NOTE — ED Provider Notes (Signed)
[facility] Emergency Department Provider Note ____________________________________________  Time seen: 16109  I have reviewed the triage vital signs and the nursing notes.  HISTORY  Chief Complaint  Cough and Fever   HPI Amber Aguilar is a 64 y.o. female presents to the ED for evaluation of onset of cough and fever.  Patient is also noted from soreness in her chest as well as generalized body aches.  She describes onset of symptoms yesterday, she was previously diagnosed with Covid last year, and reports he susceptible to pneumonia.  She has been previously vaccinated against Covid and flu.  She denies any taste/smell sensation, she reports similar symptoms in her grandkids with whom she has had recent contact.   Past Medical History:  Diagnosis Date  . Arthritis    rheumatoid  . CHF (congestive heart failure) ([place])   . COPD (chronic obstructive pulmonary disease) ([place])   . Diabetes mellitus without complication [facility])     Patient Active Problem List   Diagnosis Date Noted  . Acute hypoxemic respiratory failure due to severe acute respiratory syndrome coronavirus 2 (SARS-CoV-2) disease ([place]) 01/23/2019  . Pneumonia due to COVID-19 virus 01/22/2019  . Acute hypoxemic respiratory failure ([place]) 01/22/2019  . Rheumatoid arthritis ([place]) 01/17/2019  . Interstitial lung disease ([place]) 01/17/2019  . Diabetes mellitus ([place])   . Acute respiratory failure due to COVID-19 [facility]) 01/14/2019    Past Surgical History:  Procedure Laterality Date  . CARPAL TUNNEL RELEASE    . RADIOLOGY WITH ANESTHESIA N/A 12/27/2019   Procedure: MRI LUMBAR SPINE W/O CONTRAST WITH ANESTHESIA;  Surgeon: Radiologist, Medication, MD;  Location: [facility];  Service: Radiology;  Laterality: N/A;    Prior to Admission medications   Medication Sig Start Date End Date Taking? Authorizing Provider  acetaminophen (TYLENOL) 500 MG tablet Take 1 tablet (500 mg total) by mouth every 6 (six) hours as  needed. Patient not taking: Reported on 12/15/2019 04/24/18   Laban Emperor, PA-C  acetaminophen (TYLENOL) 650 MG CR tablet Take 1,300 mg by mouth See admin instructions. Tale 1300 mg in the morning and 650 mg at night    [provider]  albuterol (VENTOLIN HFA) 108 (90 Base) MCG/ACT inhaler Inhale 2 puffs into the lungs every 6 (six) hours as needed for wheezing or shortness of breath.    [provider]  aspirin EC 81 MG tablet Take 81 mg by mouth daily. Swallow whole.    [provider]  atorvastatin (LIPITOR) 10 MG tablet Take 10 mg by mouth daily.    [provider]  cyclobenzaprine (FLEXERIL) 5 MG tablet Take 1 tablet (5 mg total) by mouth 3 (three) times daily as needed. Patient not taking: Reported on 12/15/2019 11/13/19   Ersie Savino, Dannielle Karvonen, PA-C  fluticasone (FLONASE) 50 MCG/ACT nasal spray Place 1 spray into both nostrils daily as needed for allergies.     [provider]  gabapentin (NEURONTIN) 300 MG capsule Take 300 mg by mouth at bedtime. 11/28/19   [provider]  hydrochlorothiazide (HYDRODIURIL) 25 MG tablet Take 25 mg by mouth daily.    [provider]  ketorolac (TORADOL) 10 MG tablet Take 1 tablet (10 mg total) by mouth every 8 (eight) hours. Patient not taking: Reported on 12/15/2019 11/13/19   Maloree Uplinger, Dannielle Karvonen, PA-C  meloxicam (MOBIC) 7.5 MG tablet Take 7.5 mg by mouth 2 (two) times daily. 11/22/19   [provider]  metFORMIN (GLUCOPHAGE) 1000 MG tablet Take 1,000 mg  by mouth daily with breakfast.    [provider]  Multiple Vitamin (MULTI-VITAMIN) tablet Take 1 tablet by mouth daily.     [provider]  mycophenolate (CELLCEPT) 500 MG tablet Take 1 tablet (500 mg total) by mouth 2 (two) times daily. PLEASE RESUME ONLY AFTER DISCUSSING WITH YOUR PRIMARY CARE PRACTITIONER Patient taking differently: Take 500 mg by mouth daily. PLEASE RESUME ONLY AFTER DISCUSSING WITH YOUR  PRIMARY CARE PRACTITIONER 02/06/19   Jonetta Osgood, MD  omeprazole (PRILOSEC) 20 MG capsule Take 20 mg by mouth daily.    [provider]  orphenadrine (NORFLEX) 100 MG tablet Take 1 tablet (100 mg total) by mouth 2 (two) times daily. Patient not taking: Reported on 12/15/2019 11/16/19   Sable Feil, PA-C  oseltamivir (TAMIFLU) 75 MG capsule Take 1 capsule (75 mg total) by mouth 2 (two) times daily for 5 days. 01/25/20 01/30/20  Jakki Doughty, Dannielle Karvonen, PA-C  oxyCODONE-acetaminophen (PERCOCET) 7.5-325 MG tablet Take 1 tablet by mouth every 6 (six) hours as needed. Patient not taking: Reported on 12/15/2019 11/16/19   Sable Feil, PA-C  vitamin B-12 (CYANOCOBALAMIN) 1000 MCG tablet Take 1,000 mcg by mouth daily.     [provider]  vitamin C (VITAMIN C) 500 MG tablet Take 1 tablet (500 mg total) by mouth daily. 01/24/19   Lorella Nimrod, MD  zinc sulfate 220 (50 Zn) MG capsule Take 1 capsule (220 mg total) by mouth daily. Patient not taking: Reported on 12/15/2019 01/24/19   Lorella Nimrod, MD    Allergies Benzalkonium chloride and Neosporin [neomycin-bacitracin zn-polymyx]  History reviewed. No pertinent family history.  Social History Social History   Tobacco Use  . Smoking status: Never Smoker  . Smokeless tobacco: Never Used  Vaping Use  . Vaping Use: Never used  Substance Use Topics  . Alcohol use: No  . Drug use: No    Review of Systems  Constitutional: Positive for fever. Eyes: Negative for visual changes. ENT: Negative for sore throat. Cardiovascular: Positive for chest pain. Respiratory: Negative for shortness of breath.  Reports cough as above. Gastrointestinal: Negative for abdominal pain, vomiting and diarrhea. Genitourinary: Negative for dysuria. Musculoskeletal: Negative for back pain. Skin: Negative for rash. Neurological: Negative for headaches, focal weakness or numbness. ____________________________________________  PHYSICAL  EXAM:  VITAL SIGNS: ED Triage Vitals [01/25/20 1625]  Enc Vitals Group     BP 118/66     Pulse Rate 93     Resp 18     Temp (!) 102.8 F (39.3 C)     Temp Source Oral     SpO2 95 %     Weight 198 lb (89.8 kg)     Height 5\' 2"  (1.575 m)     Head Circumference      Peak Flow      Pain Score 7     Pain Loc      Pain Edu?      Excl. in [place]?     Constitutional: Alert and oriented. Well appearing and in no distress. Head: Normocephalic and atraumatic. Eyes: Conjunctivae are normal. PERRL. Normal extraocular movements Ears: Canals clear. TMs intact bilaterally. Nose: No congestion/rhinorrhea/epistaxis. Mouth/Throat: Mucous membranes are moist. Neck: Supple. No thyromegaly. Hematological/Lymphatic/Immunological: No cervical lymphadenopathy. Cardiovascular: Normal rate, regular rhythm. Normal distal pulses. Respiratory: Normal respiratory effort. No wheezes/rales/rhonchi. Gastrointestinal: Soft and nontender. No distention. Musculoskeletal: Nontender with normal range of motion in all extremities.  Neurologic:  Normal gait without ataxia. Normal speech and language.  No gross focal neurologic deficits are appreciated. ____________________________________________   LABS (pertinent positives/negatives) Labs Reviewed  RESP PANEL BY RT-PCR (FLU A&B, COVID) ARPGX2 - Abnormal; Notable for the following components:      Result Value   Influenza A by PCR POSITIVE (*)    All other components within normal limits  BASIC METABOLIC PANEL - Abnormal; Notable for the following components:   Potassium 3.4 (*)    Glucose, Bld 161 (*)    Calcium 8.7 (*)    All other components within normal limits  CBC WITH DIFFERENTIAL/PLATELET - Abnormal; Notable for the following components:   Platelets 409 (*)    Monocytes Absolute 1.4 (*)    All other components within normal limits  URINALYSIS, COMPLETE (UACMP) WITH MICROSCOPIC - Abnormal; Notable for the following components:   Color, Urine YELLOW (*)     APPearance CLOUDY (*)    Protein, ur 30 (*)    Leukocytes,Ua LARGE (*)    Bacteria, UA FEW (*)    All other components within normal limits  TROPONIN I (HIGH SENSITIVITY)  TROPONIN I (HIGH SENSITIVITY)  ____________________________________________  EKG  NSR 95 bpm PR Interval 150 ms QRS duration 72 ms Normal axis No STEMI ____________________________________________   RADIOLOGY  CXR  IMPRESSION: Streaky bibasilar opacities that may be scarring. Previous multifocal pulmonary opacities have otherwise resolved. No confluent consolidation. ____________________________________________  PROCEDURES  Procedures ____________________________________________  INITIAL IMPRESSION / ASSESSMENT AND PLAN / ED COURSE  DDX: COVID, flu, ACS, CAP  Patient with ED evaluation of sudden onset of body aches, fevers, cough, chest pain.  Patient was evaluated for her symptoms on presentation.  She was found to have some bibasilar opacities considered to be scarring on her chest x-ray.  Her labs are more reassuring and benign at this time without any elevation in her troponin to indicate an acute coronary syndrome.  Her viral screen did show she was influenza A+.  Patient is reassured by her diagnosis at this time.  She is stable for discharge without signs of acute respiratory distress, dehydration, toxic appearance.  Patient will be prescribed Tamiflu for symptom relief.  She will follow-up with primary provider return to the ED if needed.  Amber Aguilar was evaluated in Emergency Department on 01/25/2020 for the symptoms described in the history of present illness. She was evaluated in the context of the global COVID-19 pandemic, which necessitated consideration that the patient might be at risk for infection with the SARS-CoV-2 virus that causes COVID-19. Institutional protocols and algorithms that pertain to the evaluation of patients at risk for COVID-19 are in a state of rapid change based on  information released by regulatory bodies including the CDC and federal and state organizations. These policies and algorithms were followed during the patient's care in the ED. ____________________________________________  FINAL CLINICAL IMPRESSION(S) / ED DIAGNOSES  Final diagnoses:  Influenza A      Carmie End, Dannielle Karvonen, PA-C 01/25/20 2009    Nena Polio, MD 01/25/20 2352

## 2020-01-25 NOTE — Discharge Instructions (Signed)
Take the Tamiflu as directed. Continue to take OTC meds for fevers, cough, and congestion. Follow-up with your primary provider for continued symptoms.

## 2020-01-25 NOTE — ED Triage Notes (Signed)
Pt comes into the ED via POV c/o cough and fever.  Pt states the cough has made her chest sore when coughing.  Pt has previously had COVID last year but she is also susceptible to pneumonia.  Pt states her symptoms started yesterday.  Pt is in NAD with even and unlabored respirations.

## 2020-01-26 ENCOUNTER — Ambulatory Visit: Payer: 59

## 2020-01-26 ENCOUNTER — Other Ambulatory Visit: Payer: Self-pay | Admitting: Family Medicine

## 2020-01-26 DIAGNOSIS — J111 Influenza due to unidentified influenza virus with other respiratory manifestations: Secondary | ICD-10-CM | POA: Diagnosis not present

## 2020-01-26 DIAGNOSIS — R059 Cough, unspecified: Secondary | ICD-10-CM | POA: Diagnosis not present

## 2020-01-30 ENCOUNTER — Other Ambulatory Visit: Payer: Self-pay

## 2020-01-30 DIAGNOSIS — Z7952 Long term (current) use of systemic steroids: Secondary | ICD-10-CM | POA: Diagnosis not present

## 2020-01-30 DIAGNOSIS — M359 Systemic involvement of connective tissue, unspecified: Secondary | ICD-10-CM | POA: Diagnosis not present

## 2020-01-30 DIAGNOSIS — Z79899 Other long term (current) drug therapy: Secondary | ICD-10-CM | POA: Diagnosis not present

## 2020-01-30 DIAGNOSIS — Z7982 Long term (current) use of aspirin: Secondary | ICD-10-CM | POA: Diagnosis not present

## 2020-01-30 DIAGNOSIS — M0510 Rheumatoid lung disease with rheumatoid arthritis of unspecified site: Secondary | ICD-10-CM | POA: Diagnosis not present

## 2020-01-30 DIAGNOSIS — B379 Candidiasis, unspecified: Secondary | ICD-10-CM | POA: Diagnosis not present

## 2020-01-30 DIAGNOSIS — Z6836 Body mass index (BMI) 36.0-36.9, adult: Secondary | ICD-10-CM | POA: Diagnosis not present

## 2020-01-30 DIAGNOSIS — R918 Other nonspecific abnormal finding of lung field: Secondary | ICD-10-CM | POA: Diagnosis not present

## 2020-01-30 DIAGNOSIS — M79642 Pain in left hand: Secondary | ICD-10-CM | POA: Diagnosis not present

## 2020-01-30 DIAGNOSIS — Z8616 Personal history of COVID-19: Secondary | ICD-10-CM | POA: Diagnosis not present

## 2020-01-30 DIAGNOSIS — M79641 Pain in right hand: Secondary | ICD-10-CM | POA: Diagnosis not present

## 2020-01-30 DIAGNOSIS — M1990 Unspecified osteoarthritis, unspecified site: Secondary | ICD-10-CM | POA: Diagnosis not present

## 2020-01-30 DIAGNOSIS — J849 Interstitial pulmonary disease, unspecified: Secondary | ICD-10-CM | POA: Diagnosis not present

## 2020-02-06 DIAGNOSIS — M48061 Spinal stenosis, lumbar region without neurogenic claudication: Secondary | ICD-10-CM | POA: Diagnosis not present

## 2020-02-06 DIAGNOSIS — G8929 Other chronic pain: Secondary | ICD-10-CM | POA: Diagnosis not present

## 2020-02-06 DIAGNOSIS — M5442 Lumbago with sciatica, left side: Secondary | ICD-10-CM | POA: Diagnosis not present

## 2020-03-20 ENCOUNTER — Other Ambulatory Visit: Payer: Self-pay

## 2020-03-20 ENCOUNTER — Encounter: Payer: Self-pay | Admitting: Emergency Medicine

## 2020-03-20 ENCOUNTER — Emergency Department: Payer: 59

## 2020-03-20 ENCOUNTER — Other Ambulatory Visit: Payer: Self-pay | Admitting: Physician Assistant

## 2020-03-20 ENCOUNTER — Emergency Department
Admission: EM | Admit: 2020-03-20 | Discharge: 2020-03-20 | Disposition: A | Discharge status: Home / Self Care | Payer: 59 | Attending: Emergency Medicine | Admitting: Emergency Medicine

## 2020-03-20 DIAGNOSIS — E119 Type 2 diabetes mellitus without complications: Secondary | ICD-10-CM | POA: Insufficient documentation

## 2020-03-20 DIAGNOSIS — J449 Chronic obstructive pulmonary disease, unspecified: Secondary | ICD-10-CM | POA: Diagnosis not present

## 2020-03-20 DIAGNOSIS — I509 Heart failure, unspecified: Secondary | ICD-10-CM | POA: Insufficient documentation

## 2020-03-20 DIAGNOSIS — Z7984 Long term (current) use of oral hypoglycemic drugs: Secondary | ICD-10-CM | POA: Insufficient documentation

## 2020-03-20 DIAGNOSIS — U071 COVID-19: Secondary | ICD-10-CM | POA: Insufficient documentation

## 2020-03-20 DIAGNOSIS — R059 Cough, unspecified: Secondary | ICD-10-CM

## 2020-03-20 DIAGNOSIS — R509 Fever, unspecified: Secondary | ICD-10-CM | POA: Diagnosis present

## 2020-03-20 DIAGNOSIS — Z7982 Long term (current) use of aspirin: Secondary | ICD-10-CM | POA: Diagnosis not present

## 2020-03-20 LAB — SARS CORONAVIRUS 2 (TAT 6-24 HRS): SARS Coronavirus 2: POSITIVE — AB

## 2020-03-20 MED ORDER — HYDROCOD POLST-CPM POLST ER 10-8 MG/5ML PO SUER: 5.0000 mL | Freq: Two times a day (BID) | ORAL | 0 refills | Status: DC | PRN

## 2020-03-20 MED ORDER — AZITHROMYCIN 250 MG PO TABS: ORAL_TABLET | ORAL | 0 refills | Status: DC

## 2020-03-20 NOTE — ED Provider Notes (Signed)
[facility] Emergency Department Provider Note ____________________________________________  Time seen: 1123  I have reviewed the triage vital signs and the nursing notes.  HISTORY  Chief Complaint  Cough (Fever/Runny nose/)  HPI Amber Aguilar is a 65 y.o. female resents herself to the ED for evaluation of 2 days of fevers, nausea, vomiting, and diarrhea.  Patient describes similar symptoms and her granddaughter, with whom she had close contact.  She reports symptom onset on Sunday, 3 days prior to arrival.   She reports her last episode of nonbloody, nonbilious emesis was yesterday.  She also reports her last episode of nonbloody diarrhea was yesterday.  Has been able to tolerate ice chips, water, and small meal portions.  She denies any ongoing fever, chest pain, or shortness of breath.  Past Medical History:  Diagnosis Date  . Arthritis    rheumatoid  . CHF (congestive heart failure) ([place])   . COPD (chronic obstructive pulmonary disease) ([place])   . Diabetes mellitus without complication [facility])     Patient Active Problem List   Diagnosis Date Noted  . Acute hypoxemic respiratory failure due to severe acute respiratory syndrome coronavirus 2 (SARS-CoV-2) disease ([place]) 01/23/2019  . Pneumonia due to COVID-19 virus 01/22/2019  . Acute hypoxemic respiratory failure ([place]) 01/22/2019  . Rheumatoid arthritis ([place]) 01/17/2019  . Interstitial lung disease ([place]) 01/17/2019  . Diabetes mellitus ([place])   . Acute respiratory failure due to COVID-19 [facility]) 01/14/2019    Past Surgical History:  Procedure Laterality Date  . CARPAL TUNNEL RELEASE    . RADIOLOGY WITH ANESTHESIA N/A 12/27/2019   Procedure: MRI LUMBAR SPINE W/O CONTRAST WITH ANESTHESIA;  Surgeon: Radiologist, Medication, MD;  Location: [place];  Service: Radiology;  Laterality: N/A;    Prior to Admission medications   Medication Sig Start Date End Date Taking? Authorizing Provider   chlorpheniramine-HYDROcodone (TUSSIONEX PENNKINETIC ER) 10-8 MG/5ML SUER Take 5 mLs by mouth every 12 (twelve) hours as needed for cough. 03/20/20  Yes Latika Kronick, Dannielle Karvonen, PA-C  acetaminophen (TYLENOL) 650 MG CR tablet Take 1,300 mg by mouth See admin instructions. Tale 1300 mg in the morning and 650 mg at night    [provider]  albuterol (VENTOLIN HFA) 108 (90 Base) MCG/ACT inhaler Inhale 2 puffs into the lungs every 6 (six) hours as needed for wheezing or shortness of breath.    [provider]  aspirin EC 81 MG tablet Take 81 mg by mouth daily. Swallow whole.    [provider]  atorvastatin (LIPITOR) 10 MG tablet Take 10 mg by mouth daily.    [provider]  azithromycin (ZITHROMAX Z-PAK) 250 MG tablet Take 2 tablets (500 mg) on  Day 1,  followed by 1 tablet (250 mg) once daily on Days 2 through 5. 03/20/20   Keaunna Skipper, Dannielle Karvonen, PA-C  fluticasone (FLONASE) 50 MCG/ACT nasal spray Place 1 spray into both nostrils daily as needed for allergies.     [provider]  gabapentin (NEURONTIN) 300 MG capsule Take 300 mg by mouth at bedtime. 11/28/19   [provider]  hydrochlorothiazide (HYDRODIURIL) 25 MG tablet Take 25 mg by mouth daily.    [provider]  meloxicam (MOBIC) 7.5 MG tablet Take 7.5 mg by mouth 2 (two) times daily. 11/22/19   [provider]  metFORMIN (GLUCOPHAGE) 1000 MG tablet Take 1,000 mg by mouth daily with breakfast.    [provider]  Multiple Vitamin (MULTI-VITAMIN) tablet Take 1 tablet by  mouth daily.     [provider]  mycophenolate (CELLCEPT) 500 MG tablet Take 1 tablet (500 mg total) by mouth 2 (two) times daily. PLEASE RESUME ONLY AFTER DISCUSSING WITH YOUR PRIMARY CARE PRACTITIONER Patient taking differently: Take 500 mg by mouth daily. PLEASE RESUME ONLY AFTER DISCUSSING WITH YOUR PRIMARY CARE PRACTITIONER 02/06/19   Jonetta Osgood, MD  omeprazole (PRILOSEC) 20 MG  capsule Take 20 mg by mouth daily.    [provider]  vitamin B-12 (CYANOCOBALAMIN) 1000 MCG tablet Take 1,000 mcg by mouth daily.     [provider]  vitamin C (VITAMIN C) 500 MG tablet Take 1 tablet (500 mg total) by mouth daily. 01/24/19   Lorella Nimrod, MD    Allergies Benzalkonium chloride and Neosporin [neomycin-bacitracin zn-polymyx]  History reviewed. No pertinent family history.  Social History Social History   Tobacco Use  . Smoking status: Never Smoker  . Smokeless tobacco: Never Used  Vaping Use  . Vaping Use: Never used  Substance Use Topics  . Alcohol use: No  . Drug use: No    Review of Systems  Constitutional: Negative for fever. Eyes: Negative for visual changes. ENT: Negative for sore throat. Cardiovascular: Negative for chest pain. Respiratory: Negative for shortness of breath.  Reports mild intermittent cough Gastrointestinal: Negative for abdominal pain. Reports resolved nausea vomiting and diarrhea. Genitourinary: Negative for dysuria. Musculoskeletal: Negative for back pain. Skin: Negative for rash. Neurological: Negative for headaches, focal weakness or numbness. ____________________________________________  PHYSICAL EXAM:  VITAL SIGNS: ED Triage Vitals  Enc Vitals Group     BP 03/20/20 1022 (!) 150/60     Pulse Rate 03/20/20 1022 98     Resp 03/20/20 1022 17     Temp 03/20/20 1022 99.3 F (37.4 C)     Temp Source 03/20/20 1022 Oral     SpO2 03/20/20 1022 98 %     Weight 03/20/20 1025 197 lb 15.6 oz (89.8 kg)     Height 03/20/20 1025 5\' 2"  (1.575 m)     Head Circumference --      Peak Flow --      Pain Score 03/20/20 1025 0     Pain Loc --      Pain Edu? --      Excl. in [place]? --     Constitutional: Alert and oriented. Well appearing and in no distress. Head: Normocephalic and atraumatic. Eyes: Conjunctivae are normal. Normal extraocular movements Cardiovascular: Normal rate, regular rhythm. Normal distal  pulses. Respiratory: Normal respiratory effort. No wheezes/rales/rhonchi. Gastrointestinal: Soft and nontender. No distention. Musculoskeletal: Nontender with normal range of motion in all extremities.  Neurologic:  Normal gait without ataxia. Normal speech and language. No gross focal neurologic deficits are appreciated. Skin:  Skin is warm, dry and intact. No rash noted. Psychiatric: Mood and affect are normal. Patient exhibits appropriate insight and judgment. ____________________________________________   LABS (pertinent positives/negatives) Labs Reviewed  SARS CORONAVIRUS 2 (TAT 6-24 HRS)  ____________________________________________   RADIOLOGY  CXR  IMPRESSION: Persistent bibasilar atelectasis or scarring ____________________________________________  PROCEDURES  Procedures ____________________________________________  INITIAL IMPRESSION / ASSESSMENT AND PLAN / ED COURSE  DDX: CAP, COVID, bronchitis, viral gastroenteritis  Patient with ED evaluation of now resolved symptoms of nausea, vomiting, and diarrhea.  Symptoms were reported as similar in her granddaughter, both now resolved of symptoms.  Patient also reports a mild persistent cough.  Chest x-ray is negative reassuring at this time patient be treated empirically with azithromycin and Tussionex syrup.  She will follow with primary provider for ongoing symptoms.  Return precautions have been discussed.   LUIS SAMI was evaluated in Emergency Department on 03/20/2020 for the symptoms described in the history of present illness. She was evaluated in the context of the global COVID-19 pandemic, which necessitated consideration that the patient might be at risk for infection with the SARS-CoV-2 virus that causes COVID-19. Institutional protocols and algorithms that pertain to the evaluation of patients at risk for COVID-19 are in a state of rapid change based on information released by regulatory bodies including the CDC and  federal and state organizations. These policies and algorithms were followed during the patient's care in the ED. ___________________________________________  FINAL CLINICAL IMPRESSION(S) / ED DIAGNOSES  Final diagnoses:  Cough      Carmie End, Dannielle Karvonen, PA-C 03/20/20 1635    Blake Divine, MD 03/21/20 [phone]

## 2020-03-20 NOTE — ED Notes (Signed)
See triage note   Low grade temp on arrival  Scratchy throat  And runny nose  Swallows well

## 2020-03-20 NOTE — ED Triage Notes (Signed)
C/O cough, fever, runny nose since Sunday.  Has not taken any medication for fever today.  AAOx3.  Skin warm and dry. NAD

## 2020-03-20 NOTE — Discharge Instructions (Addendum)
Take the prescription med as directed. Follow your Covid results on [facility] MyChart.

## 2020-03-21 ENCOUNTER — Other Ambulatory Visit: Payer: Self-pay | Admitting: Family

## 2020-03-21 ENCOUNTER — Telehealth: Payer: Self-pay

## 2020-03-21 ENCOUNTER — Telehealth: Payer: Self-pay | Admitting: Family

## 2020-03-21 MED ORDER — NIRMATRELVIR/RITONAVIR (PAXLOVID)TABLET: 3.0000 | ORAL_TABLET | Freq: Two times a day (BID) | ORAL | 0 refills | Status: AC

## 2020-03-21 NOTE — Telephone Encounter (Signed)
Patient was prescribed oral covid treatment Paxlovid and treatment note was reviewed. Medication has been received by [name] and reviewed for appropriateness.  Drug Interactions or Dosage Adjustments Noted: Patient instructed to hold atorvastatin and fluticasone nasal spray while taking Paxlovid.  Delivery Method: Pick-up  Patient contacted for counseling on 03/21/20 and verbalized understanding.   Delivery or Pick-Up Date: 03/21/20   Carolynne Edouard 03/21/2020, 9:31 AM [place] Pharmacist Phone# [phone]

## 2020-03-21 NOTE — Telephone Encounter (Signed)
Outpatient Oral COVID Treatment Note  I connected with Amber Aguilar on 03/21/2020/8:51 AM by telephone and verified that I am speaking with the correct person using two identifiers.  I discussed the limitations, risks, security, and privacy concerns of performing an evaluation and management service by telephone and the availability of in person appointments. I also discussed with the patient that there may be a patient responsible charge related to this service. The patient expressed understanding and agreed to proceed.  Patient location: [place] residence Provider location:  residence  Diagnosis: COVID-19 infection  Purpose of visit: Discussion of potential use of Molnupiravir or Paxlovid, a new treatment for mild to moderate COVID-19 viral infection in non-hospitalized patients.   Subjective: Patient is a 65 y.o. female who has been diagnosed with COVID 19 viral infection.  Their symptoms began on 03/19/20 with congestion, nausea, diarrhea.    Past Medical History:  Diagnosis Date  . Arthritis    rheumatoid  . CHF (congestive heart failure) ([facility])   . COPD (chronic obstructive pulmonary disease) ([facility])   . Diabetes mellitus without complication ([facility])     Allergies  Allergen Reactions  . Benzalkonium Chloride Swelling  . Neosporin [Neomycin-Bacitracin Zn-Polymyx] Swelling     Current Outpatient Medications:  .  acetaminophen (TYLENOL) 650 MG CR tablet, Take 1,300 mg by mouth See admin instructions. Tale 1300 mg in the morning and 650 mg at night, Disp: , Rfl:  .  albuterol (VENTOLIN HFA) 108 (90 Base) MCG/ACT inhaler, Inhale 2 puffs into the lungs every 6 (six) hours as needed for wheezing or shortness of breath., Disp: , Rfl:  .  aspirin EC 81 MG tablet, Take 81 mg by mouth daily. Swallow whole., Disp: , Rfl:  .  atorvastatin (LIPITOR) 10 MG tablet, Take 10 mg by mouth daily., Disp: , Rfl:  .  azithromycin (ZITHROMAX Z-PAK) 250 MG tablet, Take 2 tablets (500 mg) on  Day 1,  followed by  1 tablet (250 mg) once daily on Days 2 through 5., Disp: 6 each, Rfl: 0 .  chlorpheniramine-HYDROcodone (TUSSIONEX PENNKINETIC ER) 10-8 MG/5ML SUER, Take 5 mLs by mouth every 12 (twelve) hours as needed for cough., Disp: 100 mL, Rfl: 0 .  fluticasone (FLONASE) 50 MCG/ACT nasal spray, Place 1 spray into both nostrils daily as needed for allergies. , Disp: , Rfl:  .  gabapentin (NEURONTIN) 300 MG capsule, Take 300 mg by mouth at bedtime., Disp: , Rfl:  .  hydrochlorothiazide (HYDRODIURIL) 25 MG tablet, Take 25 mg by mouth daily., Disp: , Rfl:  .  meloxicam (MOBIC) 7.5 MG tablet, Take 7.5 mg by mouth 2 (two) times daily., Disp: , Rfl:  .  metFORMIN (GLUCOPHAGE) 1000 MG tablet, Take 1,000 mg by mouth daily with breakfast., Disp: , Rfl:  .  Multiple Vitamin (MULTI-VITAMIN) tablet, Take 1 tablet by mouth daily. , Disp: , Rfl:  .  mycophenolate (CELLCEPT) 500 MG tablet, Take 1 tablet (500 mg total) by mouth 2 (two) times daily. PLEASE RESUME ONLY AFTER DISCUSSING WITH YOUR PRIMARY CARE PRACTITIONER (Patient taking differently: Take 500 mg by mouth daily. PLEASE RESUME ONLY AFTER DISCUSSING WITH YOUR PRIMARY CARE PRACTITIONER), Disp:  , Rfl:  .  omeprazole (PRILOSEC) 20 MG capsule, Take 20 mg by mouth daily., Disp: , Rfl:  .  vitamin B-12 (CYANOCOBALAMIN) 1000 MCG tablet, Take 1,000 mcg by mouth daily. , Disp: , Rfl:  .  vitamin C (VITAMIN C) 500 MG tablet, Take 1 tablet (500 mg total) by mouth daily.,  Disp:  , Rfl:   Objective: Patient appears/sounds well.  They are in no apparent distress.  Breathing is non labored.  Mood and behavior are normal.  Laboratory Data:  Recent Results (from the past 2160 hour(s))  SARS CORONAVIRUS 2 (TAT 6-24 HRS) Nasopharyngeal Nasopharyngeal Swab     Status: None   Collection Time: 12/23/19  3:30 PM   Specimen: Nasopharyngeal Swab  Result Value Ref Range   SARS Coronavirus 2 NEGATIVE NEGATIVE    Comment: (NOTE) SARS-CoV-2 target nucleic acids are NOT DETECTED.  The  SARS-CoV-2 RNA is generally detectable in upper and lower respiratory specimens during the acute phase of infection. Negative results do not preclude SARS-CoV-2 infection, do not rule out co-infections with other pathogens, and should not be used as the sole basis for treatment or other patient management decisions. Negative results must be combined with clinical observations, patient history, and epidemiological information. The expected result is Negative.  Fact Sheet for Patients: SugarRoll.be  Fact Sheet for Healthcare Providers: https://www.woods-mathews.com/  This test is not yet approved or cleared by the [place] FDA and  has been authorized for detection and/or diagnosis of SARS-CoV-2 by FDA under an Emergency Use Authorization (EUA). This EUA will remain  in effect (meaning this test can be used) for the duration of the COVID-19 declaration under Se ction 564(b)(1) of the Act, 21 U.S.C. section 360bbb-3(b)(1), unless the authorization is terminated or revoked sooner.  Performed at [facility], [place] [address] [place], [place] [address]   Basic metabolic panel per protocol     Status: Abnormal   Collection Time: 12/27/19  5:55 AM  Result Value Ref Range   Sodium 140 135 - 145 mmol/L   Potassium 4.2 3.5 - 5.1 mmol/L   Chloride 100 98 - 111 mmol/L   CO2 30 22 - 32 mmol/L   Glucose, Bld 130 (H) 70 - 99 mg/dL    Comment: Glucose reference range applies only to samples taken after fasting for at least 8 hours.   BUN 16 8 - 23 mg/dL   Creatinine, Ser 0.75 0.44 - 1.00 mg/dL   Calcium 10.0 8.9 - 10.3 mg/dL   GFR, Estimated >60 >60 mL/min    Comment: (NOTE) Calculated using the CKD-EPI Creatinine Equation (2021)    Anion gap 10 5 - 15    Comment: Performed at [facility] [address] [place], [place] [address]  Glucose, capillary     Status: Abnormal   Collection Time: 12/27/19  6:29 AM  Result Value Ref  Range   Glucose-Capillary 135 (H) 70 - 99 mg/dL    Comment: Glucose reference range applies only to samples taken after fasting for at least 8 hours.  Glucose, capillary     Status: Abnormal   Collection Time: 12/27/19  9:53 AM  Result Value Ref Range   Glucose-Capillary 101 (H) 70 - 99 mg/dL    Comment: Glucose reference range applies only to samples taken after fasting for at least 8 hours.   Comment 1 Notify RN    Comment 2 Document in Chart   Resp Panel by RT-PCR (Flu A&B, Covid) Nasopharyngeal Swab     Status: Abnormal   Collection Time: 01/25/20  5:07 PM   Specimen: Nasopharyngeal Swab; Nasopharyngeal(NP) swabs in vial transport medium  Result Value Ref Range   SARS Coronavirus 2 by RT PCR NEGATIVE NEGATIVE    Comment: (NOTE) SARS-CoV-2 target nucleic acids are NOT DETECTED.  The SARS-CoV-2 RNA is  generally detectable in upper respiratory specimens during the acute phase of infection. The lowest concentration of SARS-CoV-2 viral copies this assay can detect is 138 copies/mL. A negative result does not preclude SARS-Cov-2 infection and should not be used as the sole basis for treatment or other patient management decisions. A negative result may occur with  improper specimen collection/handling, submission of specimen other than nasopharyngeal swab, presence of viral mutation(s) within the areas targeted by this assay, and inadequate number of viral copies(<138 copies/mL). A negative result must be combined with clinical observations, patient history, and epidemiological information. The expected result is Negative.  Fact Sheet for Patients:  EntrepreneurPulse.com.au  Fact Sheet for Healthcare Providers:  IncredibleEmployment.be  This test is no t yet approved or cleared by the [place] FDA and  has been authorized for detection and/or diagnosis of SARS-CoV-2 by FDA under an Emergency Use Authorization (EUA). This EUA will remain   in effect (meaning this test can be used) for the duration of the COVID-19 declaration under Section 564(b)(1) of the Act, 21 U.S.C.section 360bbb-3(b)(1), unless the authorization is terminated  or revoked sooner.       Influenza A by PCR POSITIVE (A) NEGATIVE   Influenza B by PCR NEGATIVE NEGATIVE    Comment: (NOTE) The Xpert Xpress SARS-CoV-2/FLU/RSV plus assay is intended as an aid in the diagnosis of influenza from Nasopharyngeal swab specimens and should not be used as a sole basis for treatment. Nasal washings and aspirates are unacceptable for Xpert Xpress SARS-CoV-2/FLU/RSV testing.  Fact Sheet for Patients: EntrepreneurPulse.com.au  Fact Sheet for Healthcare Providers: IncredibleEmployment.be  This test is not yet approved or cleared by the [place] FDA and has been authorized for detection and/or diagnosis of SARS-CoV-2 by FDA under an Emergency Use Authorization (EUA). This EUA will remain in effect (meaning this test can be used) for the duration of the COVID-19 declaration under Section 564(b)(1) of the Act, 21 U.S.C. section 360bbb-3(b)(1), unless the authorization is terminated or revoked.  Performed at [facility], [address] [place], [place] [address]   Basic metabolic panel     Status: Abnormal   Collection Time: 01/25/20  5:07 PM  Result Value Ref Range   Sodium 138 135 - 145 mmol/L   Potassium 3.4 (L) 3.5 - 5.1 mmol/L   Chloride 102 98 - 111 mmol/L   CO2 26 22 - 32 mmol/L   Glucose, Bld 161 (H) 70 - 99 mg/dL    Comment: Glucose reference range applies only to samples taken after fasting for at least 8 hours.   BUN 10 8 - 23 mg/dL   Creatinine, Ser 0.74 0.44 - 1.00 mg/dL   Calcium 8.7 (L) 8.9 - 10.3 mg/dL   GFR, Estimated >60 >60 mL/min    Comment: (NOTE) Calculated using the CKD-EPI Creatinine Equation (2021)    Anion gap 10 5 - 15    Comment: Performed at [facility], [address] [place], [place] [address]  CBC with Differential     Status: Abnormal   Collection Time: 01/25/20  5:07 PM  Result Value Ref Range   WBC 9.0 4.0 - 10.5 K/uL   RBC 4.51 3.87 - 5.11 MIL/uL   Hemoglobin 12.1 12.0 - 15.0 g/dL   HCT 36.8 36.0 - 46.0 %   MCV 81.6 80.0 - 100.0 fL   MCH 26.8 26.0 - 34.0 pg   MCHC 32.9 30.0 - 36.0 g/dL   RDW 14.2 11.5 - 15.5 %  Platelets 409 (H) 150 - 400 K/uL   nRBC 0.0 0.0 - 0.2 %   Neutrophils Relative % 64 %   Neutro Abs 5.7 1.7 - 7.7 K/uL   Lymphocytes Relative 20 %   Lymphs Abs 1.8 0.7 - 4.0 K/uL   Monocytes Relative 15 %   Monocytes Absolute 1.4 (H) 0.1 - 1.0 K/uL   Eosinophils Relative 0 %   Eosinophils Absolute 0.0 0.0 - 0.5 K/uL   Basophils Relative 1 %   Basophils Absolute 0.1 0.0 - 0.1 K/uL   Immature Granulocytes 0 %   Abs Immature Granulocytes 0.03 0.00 - 0.07 K/uL    Comment: Performed at [facility], [address], [place], [place] [address]  Troponin I (High Sensitivity)     Status: None   Collection Time: 01/25/20  5:07 PM  Result Value Ref Range   Troponin I (High Sensitivity) 5 <18 ng/L    Comment: (NOTE) Elevated high sensitivity troponin I (hsTnI) values and significant  changes across serial measurements may suggest ACS but many other  chronic and acute conditions are known to elevate hsTnI results.  Refer to the "Links" section for chest pain algorithms and additional  guidance. Performed at [facility], [address] [place], [place] [address]   Urinalysis, Complete w Microscopic Urine, Clean Catch     Status: Abnormal   Collection Time: 01/25/20  5:08 PM  Result Value Ref Range   Color, Urine YELLOW (A) YELLOW   APPearance CLOUDY (A) CLEAR   Specific Gravity, Urine 1.026 1.005 - 1.030   pH 6.0 5.0 - 8.0   Glucose, UA NEGATIVE NEGATIVE mg/dL   Hgb urine dipstick NEGATIVE NEGATIVE   Bilirubin Urine NEGATIVE NEGATIVE   Ketones, ur NEGATIVE NEGATIVE mg/dL   Protein, ur 30 (A)  NEGATIVE mg/dL   Nitrite NEGATIVE NEGATIVE   Leukocytes,Ua LARGE (A) NEGATIVE   RBC / HPF 0-5 0 - 5 RBC/hpf   WBC, UA 0-5 0 - 5 WBC/hpf   Bacteria, UA FEW (A) NONE SEEN   Squamous Epithelial / LPF 0-5 0 - 5   Mucus PRESENT     Comment: Performed at [facility], [address] [place], [place] [address]  SARS CORONAVIRUS 2 (TAT 6-24 HRS) Nasopharyngeal Nasopharyngeal Swab     Status: Abnormal   Collection Time: 03/20/20 11:25 AM   Specimen: Nasopharyngeal Swab  Result Value Ref Range   SARS Coronavirus 2 POSITIVE (A) NEGATIVE    Comment: (NOTE) SARS-CoV-2 target nucleic acids are DETECTED.  The SARS-CoV-2 RNA is generally detectable in upper and lower respiratory specimens during the acute phase of infection. Positive results are indicative of the presence of SARS-CoV-2 RNA. Clinical correlation with patient history and other diagnostic information is  necessary to determine patient infection status. Positive results do not rule out bacterial infection or co-infection with other viruses.  The expected result is Negative.  Fact Sheet for Patients: SugarRoll.be  Fact Sheet for Healthcare Providers: https://www.woods-mathews.com/  This test is not yet approved or cleared by the [place] FDA and  has been authorized for detection and/or diagnosis of SARS-CoV-2 by FDA under an Emergency Use Authorization (EUA). This EUA will remain  in effect (meaning this test can be used) for the duration of the COVID-19 declaration under Section 564(b)(1) of the Act, 21 U. S.C. section 360bbb-3(b)(1), unless the authorization is terminated or revoked sooner.   Performed at [facility], [place] [address] [place], [place] [address]  Assessment: 65 y.o. female with mild/moderate COVID 19 viral infection diagnosed on 03/20/20 at high risk for progression to severe COVID 19.  Plan:  This patient is a 65 y.o. female that meets the  following criteria for Emergency Use Authorization of: Paxlovid 1. Age >12 yr AND > 40 kg 2. SARS-COV-2 positive test 3. Symptom onset < 5 days 4. Mild-to-moderate COVID disease with high risk for severe progression to hospitalization or death  I have spoken and communicated the following to the patient or parent/caregiver regarding: 1. Paxlovid is an unapproved drug that is authorized for use under an Emergency Use Authorization.  2. There are no adequate, approved, available products for the treatment of COVID-19 in adults who have mild-to-moderate COVID-19 and are at high risk for progressing to severe COVID-19, including hospitalization or death. 3. Other therapeutics are currently authorized. For additional information on all products authorized for treatment or prevention of COVID-19, please see TanEmporium.pl.  4. There are benefits and risks of taking this treatment as outlined in the "Fact Sheet for Patients and Caregivers."  5. "Fact Sheet for Patients and Caregivers" was reviewed with patient. A hard copy will be provided to patient from pharmacy prior to the patient receiving treatment. 6. Patients should continue to self-isolate and use infection control measures (e.g., wear mask, isolate, social distance, avoid sharing personal items, clean and disinfect "high touch" surfaces, and frequent handwashing) according to CDC guidelines.  7. The patient or parent/caregiver has the option to accept or refuse treatment. 8. Patient medication history was reviewed for potential drug interactions:Interaction with home meds: She wil hold Flonase and Atorvastatin while taking Paxlovid 9. Patient's GFR was calculated to be >60, and they were therefore prescribed Normal dose (GFR>60) - nirmatrelvir 150mg  tab (2 tablet) by mouth twice daily AND ritonavir 100mg  tab (1 tablet) by mouth twice  daily   After reviewing above information with the patient, the patient agrees to receive Paxlovid.  Follow up instructions:    . Take prescription BID x 5 days as directed . Reach out to pharmacist for counseling on medication if desired . For concerns regarding further COVID symptoms please follow up with your PCP or urgent care . For urgent or life-threatening issues, seek care at your local emergency department  The patient was provided an opportunity to ask questions, and all were answered. The patient agreed with the plan and demonstrated an understanding of the instructions.   Script sent to [facility] and opted to pick up RX.  The patient was advised to call their PCP or seek an in-person evaluation if the symptoms worsen or if the condition fails to improve as anticipated.   I provided 15 minutes of non face-to-face telephone visit time during this encounter, and > 50% was spent counseling as documented under my assessment & plan.  Amber Dubonnet, NP 03/21/2020 /8:51 AM

## 2020-03-29 ENCOUNTER — Other Ambulatory Visit: Payer: Self-pay | Admitting: Family Medicine

## 2020-04-12 ENCOUNTER — Other Ambulatory Visit: Payer: Self-pay | Admitting: Family Medicine

## 2020-04-12 DIAGNOSIS — M359 Systemic involvement of connective tissue, unspecified: Secondary | ICD-10-CM | POA: Diagnosis not present

## 2020-04-12 DIAGNOSIS — R2991 Unspecified symptoms and signs involving the musculoskeletal system: Secondary | ICD-10-CM | POA: Diagnosis not present

## 2020-04-12 DIAGNOSIS — R600 Localized edema: Secondary | ICD-10-CM | POA: Diagnosis not present

## 2020-04-12 DIAGNOSIS — R609 Edema, unspecified: Secondary | ICD-10-CM | POA: Diagnosis not present

## 2020-04-12 DIAGNOSIS — J8489 Other specified interstitial pulmonary diseases: Secondary | ICD-10-CM | POA: Diagnosis not present

## 2020-04-12 DIAGNOSIS — E669 Obesity, unspecified: Secondary | ICD-10-CM | POA: Diagnosis not present

## 2020-04-12 DIAGNOSIS — Z01419 Encounter for gynecological examination (general) (routine) without abnormal findings: Secondary | ICD-10-CM | POA: Diagnosis not present

## 2020-04-12 DIAGNOSIS — I10 Essential (primary) hypertension: Secondary | ICD-10-CM | POA: Diagnosis not present

## 2020-04-12 DIAGNOSIS — Z23 Encounter for immunization: Secondary | ICD-10-CM | POA: Diagnosis not present

## 2020-04-12 DIAGNOSIS — E119 Type 2 diabetes mellitus without complications: Secondary | ICD-10-CM | POA: Diagnosis not present

## 2020-04-12 DIAGNOSIS — K219 Gastro-esophageal reflux disease without esophagitis: Secondary | ICD-10-CM | POA: Diagnosis not present

## 2020-05-01 DIAGNOSIS — M79671 Pain in right foot: Secondary | ICD-10-CM | POA: Diagnosis not present

## 2020-05-01 DIAGNOSIS — R600 Localized edema: Secondary | ICD-10-CM | POA: Diagnosis not present

## 2020-05-01 DIAGNOSIS — E119 Type 2 diabetes mellitus without complications: Secondary | ICD-10-CM | POA: Diagnosis not present

## 2020-05-01 DIAGNOSIS — M19071 Primary osteoarthritis, right ankle and foot: Secondary | ICD-10-CM | POA: Diagnosis not present

## 2020-05-09 ENCOUNTER — Other Ambulatory Visit: Payer: Self-pay | Admitting: Internal Medicine

## 2020-05-09 DIAGNOSIS — M069 Rheumatoid arthritis, unspecified: Secondary | ICD-10-CM | POA: Diagnosis not present

## 2020-05-09 DIAGNOSIS — J849 Interstitial pulmonary disease, unspecified: Secondary | ICD-10-CM | POA: Diagnosis not present

## 2020-05-09 DIAGNOSIS — M8589 Other specified disorders of bone density and structure, multiple sites: Secondary | ICD-10-CM | POA: Diagnosis not present

## 2020-05-09 DIAGNOSIS — Z7952 Long term (current) use of systemic steroids: Secondary | ICD-10-CM | POA: Diagnosis not present

## 2020-05-09 DIAGNOSIS — R0902 Hypoxemia: Secondary | ICD-10-CM | POA: Diagnosis not present

## 2020-05-09 DIAGNOSIS — R600 Localized edema: Secondary | ICD-10-CM | POA: Diagnosis not present

## 2020-05-09 DIAGNOSIS — Z6836 Body mass index (BMI) 36.0-36.9, adult: Secondary | ICD-10-CM | POA: Diagnosis not present

## 2020-05-09 DIAGNOSIS — M2550 Pain in unspecified joint: Secondary | ICD-10-CM | POA: Diagnosis not present

## 2020-05-09 DIAGNOSIS — N951 Menopausal and female climacteric states: Secondary | ICD-10-CM | POA: Diagnosis not present

## 2020-05-09 DIAGNOSIS — J302 Other seasonal allergic rhinitis: Secondary | ICD-10-CM | POA: Diagnosis not present

## 2020-05-09 DIAGNOSIS — E119 Type 2 diabetes mellitus without complications: Secondary | ICD-10-CM | POA: Diagnosis not present

## 2020-05-09 DIAGNOSIS — J3489 Other specified disorders of nose and nasal sinuses: Secondary | ICD-10-CM | POA: Diagnosis not present

## 2020-05-09 DIAGNOSIS — J329 Chronic sinusitis, unspecified: Secondary | ICD-10-CM | POA: Diagnosis not present

## 2020-05-12 ENCOUNTER — Other Ambulatory Visit: Payer: Self-pay

## 2020-05-12 MED FILL — Albuterol Sulfate Inhal Aero 108 MCG/ACT (90MCG Base Equiv): RESPIRATORY_TRACT | 25 days supply | Qty: 18 | Fill #0 | Status: CN

## 2020-05-15 ENCOUNTER — Other Ambulatory Visit: Payer: Self-pay

## 2020-05-17 ENCOUNTER — Other Ambulatory Visit: Payer: Self-pay

## 2020-05-17 MED ORDER — HYDROCHLOROTHIAZIDE 25 MG PO TABS
1.0000 | ORAL_TABLET | Freq: Every day | ORAL | 3 refills | Status: AC
  Filled 2020-05-17 – 2020-08-03 (×4): qty 90, 90d supply, fill #0
  Filled 2020-11-13: qty 90, 90d supply, fill #1
  Filled 2021-03-27: qty 90, 90d supply, fill #2

## 2020-05-22 ENCOUNTER — Other Ambulatory Visit: Payer: Self-pay

## 2020-05-22 MED ORDER — HYDROCHLOROTHIAZIDE 25 MG PO TABS: 25.0000 mg | ORAL_TABLET | Freq: Every day | ORAL | 3 refills | Status: DC

## 2020-05-23 ENCOUNTER — Other Ambulatory Visit: Payer: Self-pay

## 2020-05-23 MED FILL — Albuterol Sulfate Inhal Aero 108 MCG/ACT (90MCG Base Equiv): RESPIRATORY_TRACT | 16 days supply | Qty: 8.5 | Fill #0 | Status: AC

## 2020-05-24 ENCOUNTER — Other Ambulatory Visit: Payer: Self-pay

## 2020-05-24 MED FILL — Atorvastatin Calcium Tab 10 MG (Base Equivalent): ORAL | 90 days supply | Qty: 90 | Fill #0 | Status: AC

## 2020-06-01 ENCOUNTER — Other Ambulatory Visit: Payer: Self-pay

## 2020-06-01 ENCOUNTER — Emergency Department
Admission: EM | Admit: 2020-06-01 | Discharge: 2020-06-01 | Disposition: A | Discharge status: Home / Self Care | Payer: 59 | Attending: Emergency Medicine | Admitting: Emergency Medicine

## 2020-06-01 DIAGNOSIS — I509 Heart failure, unspecified: Secondary | ICD-10-CM | POA: Diagnosis not present

## 2020-06-01 DIAGNOSIS — E119 Type 2 diabetes mellitus without complications: Secondary | ICD-10-CM | POA: Diagnosis not present

## 2020-06-01 DIAGNOSIS — Z79899 Other long term (current) drug therapy: Secondary | ICD-10-CM | POA: Diagnosis not present

## 2020-06-01 DIAGNOSIS — J0101 Acute recurrent maxillary sinusitis: Secondary | ICD-10-CM | POA: Diagnosis not present

## 2020-06-01 DIAGNOSIS — J449 Chronic obstructive pulmonary disease, unspecified: Secondary | ICD-10-CM | POA: Diagnosis not present

## 2020-06-01 DIAGNOSIS — Z7984 Long term (current) use of oral hypoglycemic drugs: Secondary | ICD-10-CM | POA: Diagnosis not present

## 2020-06-01 DIAGNOSIS — Z8616 Personal history of COVID-19: Secondary | ICD-10-CM | POA: Insufficient documentation

## 2020-06-01 DIAGNOSIS — Z7982 Long term (current) use of aspirin: Secondary | ICD-10-CM | POA: Insufficient documentation

## 2020-06-01 DIAGNOSIS — J0100 Acute maxillary sinusitis, unspecified: Secondary | ICD-10-CM

## 2020-06-01 DIAGNOSIS — R0981 Nasal congestion: Secondary | ICD-10-CM | POA: Diagnosis present

## 2020-06-01 MED ORDER — AMOXICILLIN-POT CLAVULANATE 875-125 MG PO TABS
1.0000 | ORAL_TABLET | Freq: Two times a day (BID) | ORAL | 0 refills | Status: AC
  Filled 2020-06-01: qty 14, 7d supply, fill #0

## 2020-06-01 NOTE — ED Provider Notes (Signed)
[place] EMERGENCY DEPARTMENT Provider Note   CSN: [id number] Arrival date & time: 06/01/20  1451     History Chief Complaint  Patient presents with  . Nasal Congestion    Amber Aguilar is a 65 y.o. female presents to the emergency department for evaluation of sinus pain, pressure and drainage x1 week.  She denies any coughing, chest pain or shortness of breath.  No nausea vomiting diarrhea chills or body aches.  She has been taking some Benadryl daily with little relief.  Over the last few days sinus pain and pressure has been increasing.  No known fevers.  HPI     Past Medical History:  Diagnosis Date  . Arthritis    rheumatoid  . CHF (congestive heart failure) ([place])   . COPD (chronic obstructive pulmonary disease) ([place])   . Diabetes mellitus without complication [facility])     Patient Active Problem List   Diagnosis Date Noted  . Acute hypoxemic respiratory failure due to severe acute respiratory syndrome coronavirus 2 (SARS-CoV-2) disease ([place]) 01/23/2019  . Pneumonia due to COVID-19 virus 01/22/2019  . Acute hypoxemic respiratory failure ([place]) 01/22/2019  . Rheumatoid arthritis ([place]) 01/17/2019  . Interstitial lung disease ([place]) 01/17/2019  . Diabetes mellitus ([place])   . Acute respiratory failure due to COVID-19 [facility]) 01/14/2019    Past Surgical History:  Procedure Laterality Date  . CARPAL TUNNEL RELEASE    . RADIOLOGY WITH ANESTHESIA N/A 12/27/2019   Procedure: MRI LUMBAR SPINE W/O CONTRAST WITH ANESTHESIA;  Surgeon: Radiologist, Medication, MD;  Location: [place];  Service: Radiology;  Laterality: N/A;     OB History   No obstetric history on file.     History reviewed. No pertinent family history.  Social History   Tobacco Use  . Smoking status: Never Smoker  . Smokeless tobacco: Never Used  Vaping Use  . Vaping Use: Never used  Substance Use Topics  . Alcohol use: No  . Drug use: No    Home Medications Prior to Admission  medications   Medication Sig Start Date End Date Taking? Authorizing Provider  amoxicillin-clavulanate (AUGMENTIN) 875-125 MG tablet Take 1 tablet by mouth every 12 (twelve) hours for 7 days. 06/01/20 06/08/20 Yes Duanne Guess, PA-C  acetaminophen (TYLENOL) 650 MG CR tablet Take 1,300 mg by mouth See admin instructions. Tale 1300 mg in the morning and 650 mg at night    [provider]  albuterol (VENTOLIN HFA) 108 (90 Base) MCG/ACT inhaler Inhale 2 puffs into the lungs every 6 (six) hours as needed for wheezing or shortness of breath.    [provider]  albuterol (VENTOLIN HFA) 108 (90 Base) MCG/ACT inhaler INHALE 2 PUFFS BY MOUTH EVERY FOUR (4) HOURS AS NEEDED FOR WHEEZING OR SHORTNESS OF BREATH. 05/09/20 05/09/21  Despotes, Belenda Cruise, MD  albuterol (VENTOLIN HFA) 108 (90 Base) MCG/ACT inhaler INHALE 2 PUFFS BY MOUTH EVERY 4 HOURS AS NEEDED FOR WHEEZING. 10/10/19 10/09/20  Jimmye Norman, MD  aspirin EC 81 MG tablet Take 81 mg by mouth daily. Swallow whole.    [provider]  atorvastatin (LIPITOR) 10 MG tablet Take 10 mg by mouth daily.    [provider]  atorvastatin (LIPITOR) 10 MG tablet TAKE 1 TABLET BY MOUTH DAILY 04/12/20 04/12/21  Windell Moment, MD  azithromycin (ZITHROMAX) 250 MG tablet TAKE 2 TABLETS BY MOUTH ON DAY 1 THEN TAKE 1 TABLET DAILY FOR THE NEXT 4 DAYS 03/20/20 03/20/21  Menshew, Dannielle Karvonen, PA-C  chlorpheniramine-HYDROcodone (TUSSIONEX) 10-8 MG/5ML SUER TAKE 5 ML BY MOUTH EVERY 12 (TWELVE) HOURS AS NEEDED FOR COUGH 03/20/20 09/16/20  Menshew, Dannielle Karvonen, PA-C  chlorpheniramine-HYDROcodone (TUSSIONEX) 10-8 MG/5ML SUER TAKE 5 ML BY MOUTH EVERY 12 HOURS AS NEEDED FOR COUGH. 01/26/20 07/24/20  Windell Moment, MD  fluticasone (FLONASE) 50 MCG/ACT nasal spray Place 1 spray into both nostrils daily as needed for allergies.     [provider]  gabapentin (NEURONTIN) 300 MG capsule Take 300 mg by mouth at bedtime. 11/28/19    [provider]  hydrochlorothiazide (HYDRODIURIL) 25 MG tablet Take 25 mg by mouth daily.    [provider]  hydrochlorothiazide (HYDRODIURIL) 25 MG tablet TAKE 1 TABLET BY MOUTH DAILY 04/12/20 04/12/21  Windell Moment, MD  hydrochlorothiazide (HYDRODIURIL) 25 MG tablet TAKE 1 TABLET BY MOUTH DAILY. 03/29/20 03/29/21  Windell Moment, MD  hydrochlorothiazide (HYDRODIURIL) 25 MG tablet TAKE 1 TABLET (25 MG TOTAL) BY MOUTH DAILY. 12/16/19 12/15/20  Windell Moment, MD  hydrochlorothiazide (HYDRODIURIL) 25 MG tablet Take 1 tablet (25 mg total) by mouth daily. 05/17/20     meloxicam (MOBIC) 7.5 MG tablet Take 7.5 mg by mouth 2 (two) times daily. 11/22/19   [provider]  metFORMIN (GLUCOPHAGE) 1000 MG tablet Take 1,000 mg by mouth daily with breakfast.    [provider]  metFORMIN (GLUCOPHAGE) 1000 MG tablet TAKE 1 TABLET BY MOUTH TWO TIMES A DAY WITH MEALS 04/12/20 04/12/21  Windell Moment, MD  metFORMIN (GLUCOPHAGE) 1000 MG tablet TAKE 1 TABLET BY MOUTH TWICE DAILY WITH A MEAL 03/29/20 03/29/21  Windell Moment, MD  Multiple Vitamin (MULTI-VITAMIN) tablet Take 1 tablet by mouth daily.     [provider]  mycophenolate (CELLCEPT) 500 MG tablet Take 1 tablet (500 mg total) by mouth 2 (two) times daily. PLEASE RESUME ONLY AFTER DISCUSSING WITH YOUR PRIMARY CARE PRACTITIONER Patient taking differently: Take 500 mg by mouth daily. PLEASE RESUME ONLY AFTER DISCUSSING WITH YOUR PRIMARY CARE PRACTITIONER 02/06/19   Jonetta Osgood, MD  mycophenolate (CELLCEPT) 500 MG tablet TAKE 1 TABLET BY MOUTH DAILY. 10/10/19 10/09/20  Jimmye Norman, MD  Nirmatrelvir & Ritonavir 20 x 150 MG & 10 x 100MG  TBPK TAKE 2 NIRMATRELVIR TABLETS AND 1 RITONAVIR TABLET BY MOUTH TWICE DAILY FOR 5 DAYS 03/21/20 03/21/21  Loel Dubonnet, NP  omeprazole (PRILOSEC) 20 MG capsule Take 20 mg by mouth daily.    [provider]  omeprazole (PRILOSEC) 20 MG capsule TAKE 1  CAPSULE BY MOUTH DAILY 04/12/20 04/12/21  Windell Moment, MD  omeprazole (PRILOSEC) 20 MG capsule TAKE 1 CAPSULE BY MOUTH DAILY. *PATIENT NEEDS APPOINTMENT* 03/29/20 03/29/21  Windell Moment, MD  omeprazole (PRILOSEC) 20 MG capsule TAKE 1 CAPSULE BY MOUTH DAILY. *PATIENT NEEDS APPOINTMENT* 12/13/19 12/12/20  Windell Moment, MD  vitamin B-12 (CYANOCOBALAMIN) 1000 MCG tablet Take 1,000 mcg by mouth daily.     [provider]  vitamin C (VITAMIN C) 500 MG tablet Take 1 tablet (500 mg total) by mouth daily. 01/24/19   Lorella Nimrod, MD    Allergies    Benzalkonium chloride and Neosporin [neomycin-bacitracin zn-polymyx]  Review of Systems   Review of Systems  Constitutional: Negative for fatigue and fever.  HENT: Positive for congestion, postnasal drip, sinus pressure and sinus pain. Negative for sore throat.   Respiratory: Negative for cough, chest tightness and shortness of breath.   Cardiovascular: Negative for chest pain.  Gastrointestinal: Negative for diarrhea,  nausea and vomiting.  Musculoskeletal: Negative for back pain.  Skin: Negative for rash.  Neurological: Negative for dizziness and headaches.    Physical Exam Updated Vital Signs BP (!) 125/51 (BP Location: Right Arm)   Pulse 86   Temp 97.9 F (36.6 C) (Oral)   Resp 18   Ht 5\' 2"  (1.575 m)   Wt 85.7 kg   SpO2 99%   BMI 34.57 kg/m   Physical Exam Constitutional:      General: She is not in acute distress.    Appearance: She is well-developed.  HENT:     Head: Normocephalic and atraumatic.     Jaw: No trismus.     Comments: Positive maxillary sinus tenderness bilaterally with no frontal sinus tenderness.    Right Ear: Hearing and external ear normal.     Left Ear: Hearing and external ear normal.     Nose: Rhinorrhea present.     Mouth/Throat:     Mouth: Mucous membranes are moist.     Pharynx: Oropharynx is clear. No oropharyngeal exudate, posterior oropharyngeal erythema or uvula swelling.      Tonsils: No tonsillar exudate or tonsillar abscesses.  Eyes:     General:        Right eye: No discharge.        Left eye: No discharge.     Conjunctiva/sclera: Conjunctivae normal.  Cardiovascular:     Rate and Rhythm: Normal rate and regular rhythm.  Pulmonary:     Effort: Pulmonary effort is normal. No respiratory distress.     Breath sounds: Normal breath sounds. No stridor. No wheezing or rales.  Abdominal:     General: There is no distension.     Palpations: Abdomen is soft.     Tenderness: There is no abdominal tenderness.  Musculoskeletal:        General: No deformity. Normal range of motion.     Cervical back: Normal range of motion. No rigidity.  Lymphadenopathy:     Cervical: No cervical adenopathy.  Skin:    General: Skin is warm and dry.  Neurological:     General: No focal deficit present.     Mental Status: She is alert and oriented to person, place, and time.     Deep Tendon Reflexes: Reflexes are normal and symmetric.  Psychiatric:        Behavior: Behavior normal.        Thought Content: Thought content normal.     ED Results / Procedures / Treatments   Labs (all labs ordered are listed, but only abnormal results are displayed) Labs Reviewed - No data to display  EKG None  Radiology No results found.  Procedures Procedures   Medications Ordered in ED Medications - No data to display  ED Course  I have reviewed the triage vital signs and the nursing notes.  Pertinent labs & imaging results that were available during my care of the patient were reviewed by me and considered in my medical decision making (see chart for details).    MDM Rules/Calculators/A&P                          65 year old female with acute maxillary sinusitis.  She is started on Augmentin.  She will start taking Claritin daily over-the-counter.  Discussed sinus rinses.  She understands signs symptoms return to the ER for. Final Clinical Impression(s) / ED Diagnoses Final  diagnoses:  Acute non-recurrent maxillary sinusitis  Rx / DC Orders ED Discharge Orders         Ordered    amoxicillin-clavulanate (AUGMENTIN) 875-125 MG tablet  Every 12 hours        06/01/20 1524           Renata Caprice 06/01/20 1529    Lavonia Drafts, MD 06/01/20 [phone]

## 2020-06-01 NOTE — Discharge Instructions (Signed)
Please take medications as prescribed.  You may use Claritin daily to help reduce sinus drainage.  I would recommend trying sinus rinse as this can help to cleanse out your sinuses and quickly improve sinus pain and pressure.  Return to the ER for any fevers, worsening symptoms or urgent changes in your health

## 2020-06-01 NOTE — ED Triage Notes (Signed)
Pt c/o yellow/white nasal drainage x1 week and sinus pressure. Pt unsure if due to allergies. Pt AOX4, NAD noted.

## 2020-07-02 DIAGNOSIS — R600 Localized edema: Secondary | ICD-10-CM | POA: Diagnosis not present

## 2020-07-02 DIAGNOSIS — J849 Interstitial pulmonary disease, unspecified: Secondary | ICD-10-CM | POA: Diagnosis not present

## 2020-07-02 DIAGNOSIS — R0600 Dyspnea, unspecified: Secondary | ICD-10-CM | POA: Diagnosis not present

## 2020-08-03 ENCOUNTER — Other Ambulatory Visit: Payer: Self-pay

## 2020-08-03 MED FILL — Albuterol Sulfate Inhal Aero 108 MCG/ACT (90MCG Base Equiv): RESPIRATORY_TRACT | 16 days supply | Qty: 8.5 | Fill #1 | Status: AC

## 2020-08-03 MED FILL — Omeprazole Cap Delayed Release 20 MG: ORAL | 90 days supply | Qty: 90 | Fill #0 | Status: AC

## 2020-08-03 MED FILL — Mycophenolate Mofetil Tab 500 MG: ORAL | 90 days supply | Qty: 90 | Fill #0 | Status: AC

## 2020-09-27 ENCOUNTER — Other Ambulatory Visit: Payer: Self-pay

## 2020-09-27 MED FILL — Albuterol Sulfate Inhal Aero 108 MCG/ACT (90MCG Base Equiv): RESPIRATORY_TRACT | 16 days supply | Qty: 8.5 | Fill #2 | Status: AC

## 2020-09-27 MED FILL — Atorvastatin Calcium Tab 10 MG (Base Equivalent): ORAL | 90 days supply | Qty: 90 | Fill #1 | Status: AC

## 2020-09-28 ENCOUNTER — Other Ambulatory Visit: Payer: Self-pay

## 2020-11-07 ENCOUNTER — Other Ambulatory Visit: Payer: Self-pay

## 2020-11-07 MED FILL — Albuterol Sulfate Inhal Aero 108 MCG/ACT (90MCG Base Equiv): RESPIRATORY_TRACT | 16 days supply | Qty: 8.5 | Fill #3 | Status: AC

## 2020-11-13 ENCOUNTER — Other Ambulatory Visit: Payer: Self-pay

## 2020-11-13 ENCOUNTER — Other Ambulatory Visit: Payer: Self-pay | Admitting: Internal Medicine

## 2020-11-13 MED FILL — Atorvastatin Calcium Tab 10 MG (Base Equivalent): ORAL | 90 days supply | Qty: 90 | Fill #2 | Status: CN

## 2020-11-13 MED FILL — Metformin HCl Tab 1000 MG: ORAL | 90 days supply | Qty: 180 | Fill #0 | Status: AC

## 2020-11-14 ENCOUNTER — Other Ambulatory Visit: Payer: Self-pay

## 2020-11-15 ENCOUNTER — Other Ambulatory Visit: Payer: Self-pay

## 2020-11-16 ENCOUNTER — Other Ambulatory Visit: Payer: Self-pay

## 2020-11-19 ENCOUNTER — Other Ambulatory Visit: Payer: Self-pay

## 2020-11-19 MED ORDER — OMEPRAZOLE 20 MG PO CPDR
DELAYED_RELEASE_CAPSULE | ORAL | 1 refills | Status: AC
  Filled 2020-11-19: qty 90, 90d supply, fill #0
  Filled 2021-03-27: qty 90, 90d supply, fill #1

## 2020-11-19 MED ORDER — MYCOPHENOLATE MOFETIL 500 MG PO TABS
500.0000 mg | ORAL_TABLET | Freq: Every day | ORAL | 2 refills | Status: DC
  Filled 2020-11-19: qty 90, 90d supply, fill #0
  Filled 2021-03-27: qty 90, 90d supply, fill #1
  Filled 2021-07-22: qty 90, 90d supply, fill #2
  Filled 2021-11-11: qty 90, 90d supply, fill #3

## 2020-11-22 ENCOUNTER — Other Ambulatory Visit: Payer: Self-pay

## 2020-12-02 IMAGING — MR MR LUMBAR SPINE W/O CM
4 of 5 series · 17 of 48 positions shown · non-contrast
Comparison: Lumbar radiographs 11/16/2019.

Chest radiographs 05/11/2014.

CLINICAL DATA: 64-year-old female with low back pain radiating to
the left leg following twisting injury 11/11/2019. Study performed
under anesthesia.

EXAM:
MRI LUMBAR SPINE WITHOUT CONTRAST
TECHNIQUE: Multiplanar, multisequence MR imaging of the lumbar spine was
performed. No intravenous contrast was administered.

[Series 2: T2 · sagittal · 4.0mm · 0.55mm/px · 7 of 15 slices shown (1 of 2)]
[im 1/15]
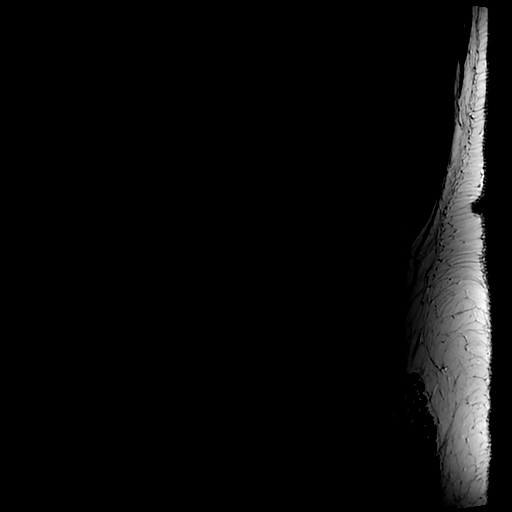
[im 3/15]
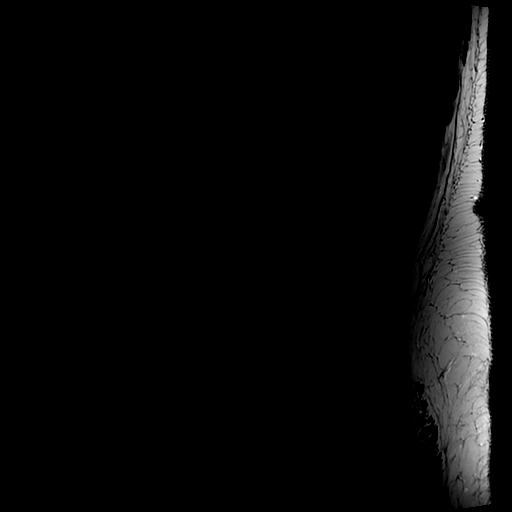
[im 5/15]
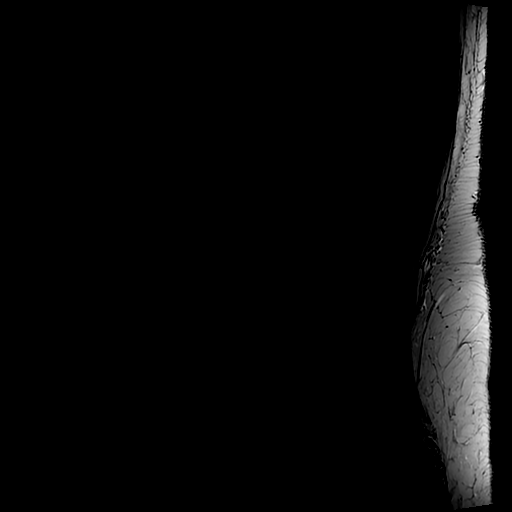
[im 8/15]
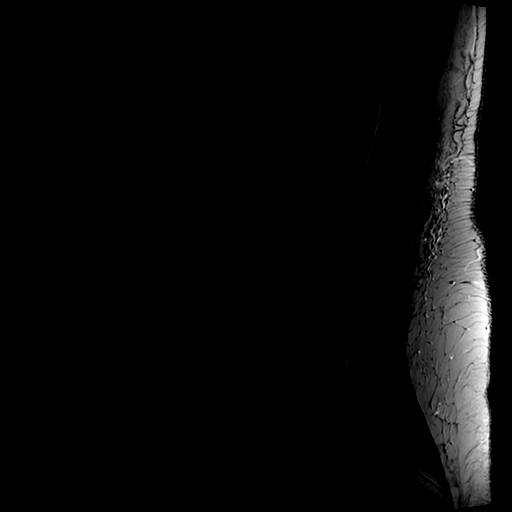
[im 10/15]
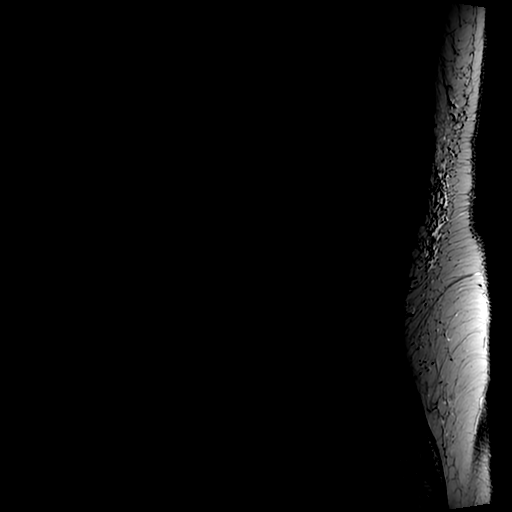
[im 12/15]
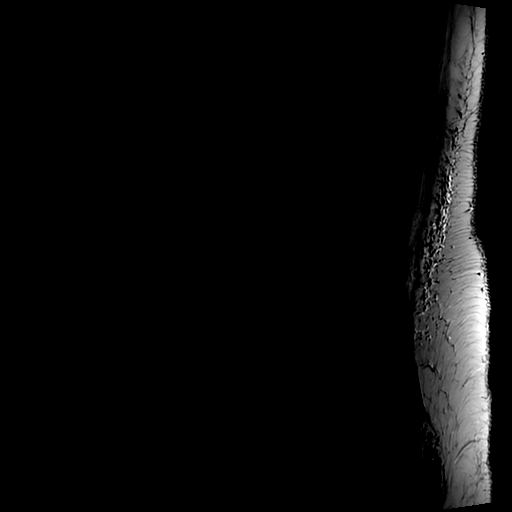
[im 15/15]
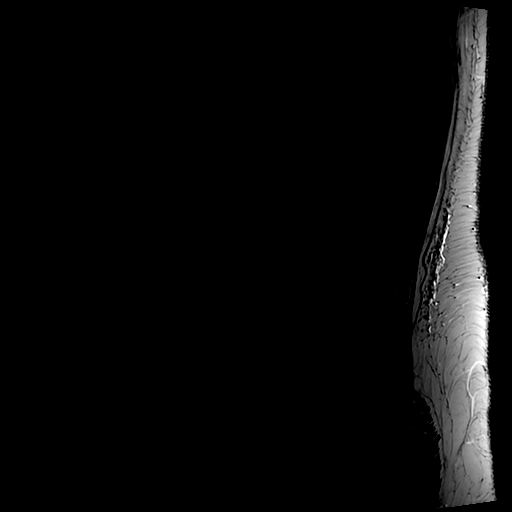

[Series 4: T1 · sagittal · 4.0mm · 0.51mm/px · 3 of 15 slices shown (1 of 2)]
[im 3/15]
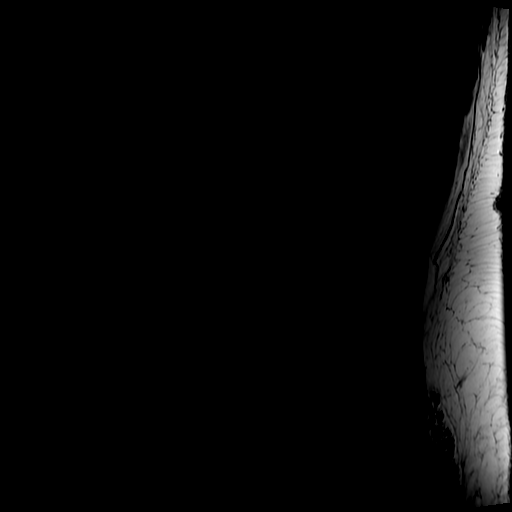
[im 9/15]
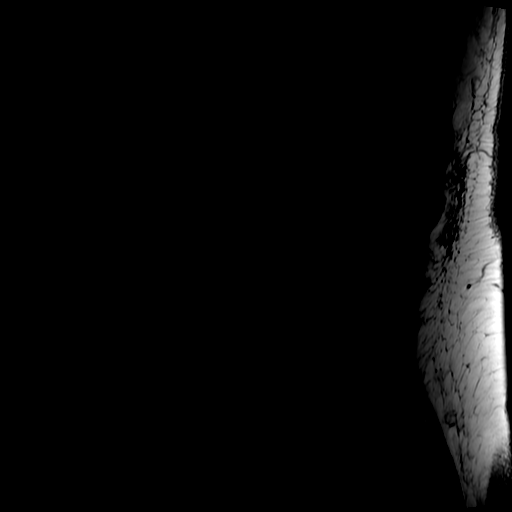
[im 15/15]
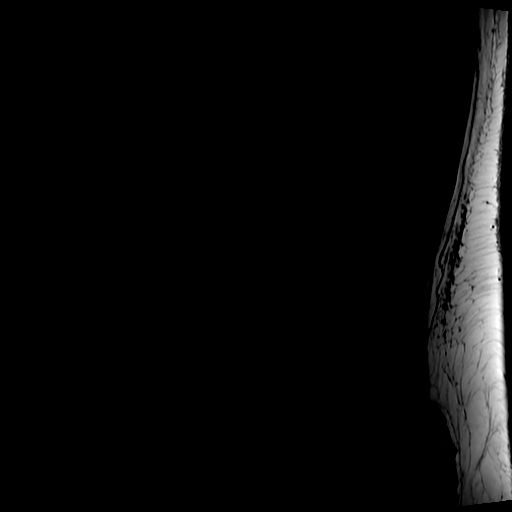

[Series 5: T2 · axial · 4.0mm · 0.39mm/px · z∈[-56,+101]mm · 4 of 33 slices shown (2 of 2)]
[im 1/33]
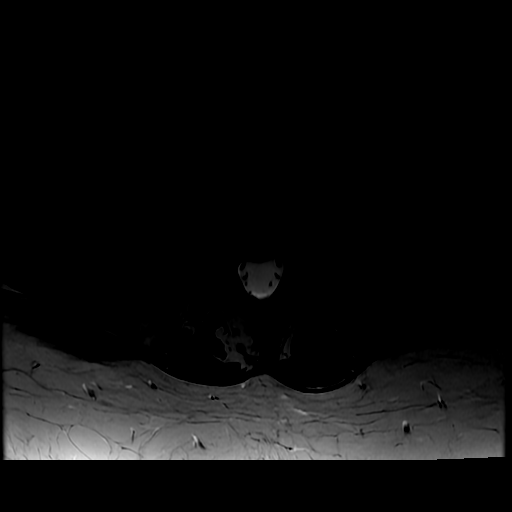
[im 5/33]
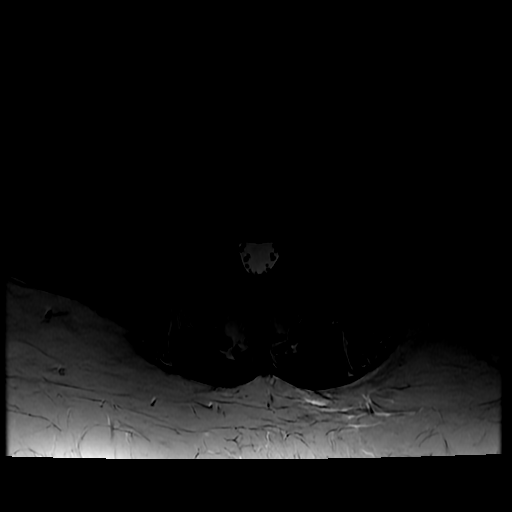
[im 18/33]
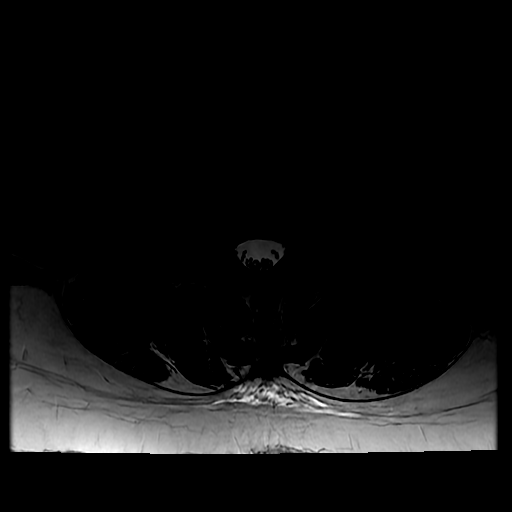
[im 28/33]
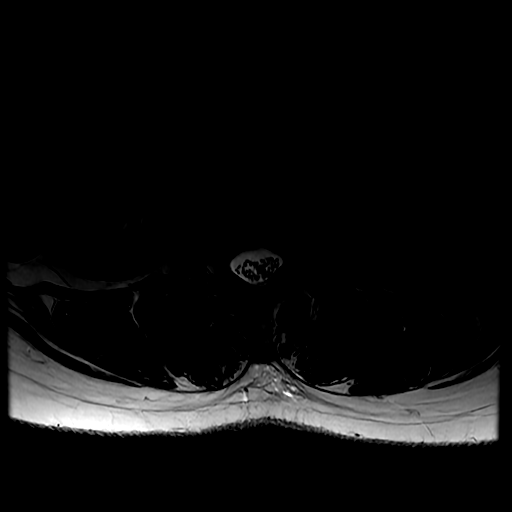

[Series 6: T1 · axial · 4.0mm · 0.39mm/px · z∈[-36,+101]mm · 3 of 33 slices shown (2 of 2)]
[im 5/33]
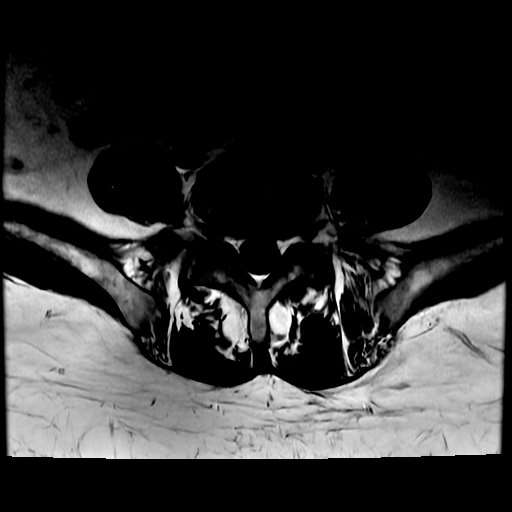
[im 18/33]
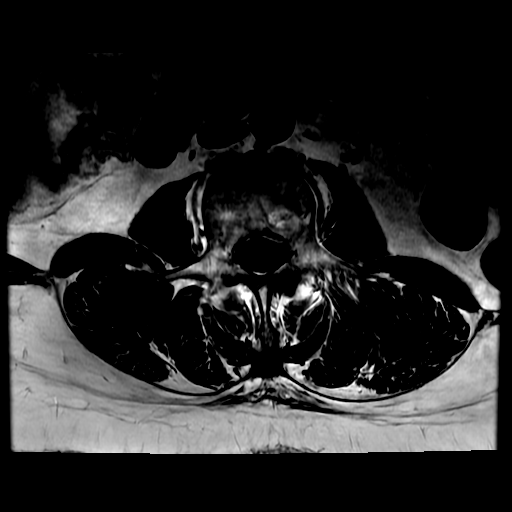
[im 28/33]
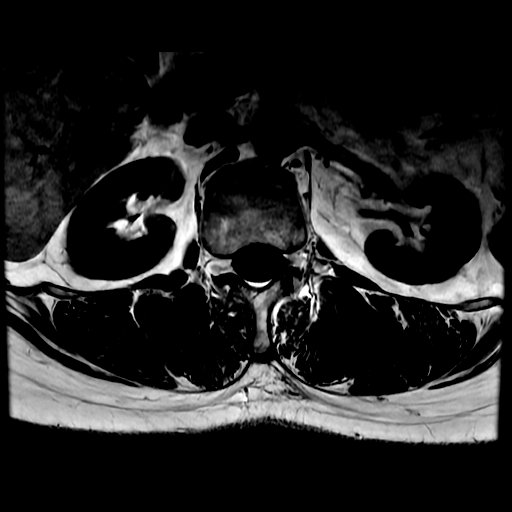

[17 of 48 positions shown; findings below may reference images not displayed]

FINDINGS: Segmentation:  Normal as on the comparison.

Alignment: Stable from the recent radiographs, with straightening of
lumbar lordosis. No significant spondylolisthesis.

Vertebrae: Chronic L2 superior endplate compression fracture with
mild anterior wedging. This has been visible since the 9631 chest
radiographs. Superimposed degenerative appearing anterior endplate
osteophytosis at that level. Preserved T12 and other lumbar
vertebral height. No marrow edema or evidence of acute osseous
abnormality. Normal background bone marrow signal. Intact visible
sacrum and SI joints.

Conus medullaris and cauda equina: Conus extends to the T12-L1
level. No lower spinal cord or conus signal abnormality.

Paraspinal and other soft tissues: There is faint inflammation in
the left lumbar rect or spine I muscles, most pronounced on series
3, image 15, maximal near the L4 level - see below. Other visualized
abdominal viscera and paraspinal soft tissues are within normal
limits.

In the visible pelvis a small dorsal uterine fibroid is suspected on
series 2, image 14.

Disc levels:

T11-T12: Mild to moderate facet hypertrophy. Mild T11 foraminal
stenosis.

T12-L1:  Negative.

L1-L2: Disc space loss. Circumferential disc bulge. Mild left L1
foraminal stenosis.

L2-L3: Mild disc desiccation. Circumferential disc bulge with
prominent bilateral L2 foraminal involvement. Mild facet
hypertrophy. No spinal or lateral recess stenosis. Moderate left
greater than right L2 foraminal stenosis.

L3-L4: Mild mostly far lateral disc bulging. Mild to moderate facet
and ligament flavum hypertrophy with degenerative facet joint fluid
greater on the left. No spinal or lateral recess stenosis. No
convincing left foraminal stenosis. There is mild right L3 foraminal
stenosis.

L4-L5: Mostly far lateral disc bulging but superimposed left
subarticular and foraminal annular fissure of the disc as well as a
moderate size extrusion of disc material into the left foramen. See
series 5 images 22 through 24. Superimposed moderate facet
hypertrophy greater on the left. Left greater than right facet joint
fluid.

No spinal or lateral recess stenosis. Severe left L4 foraminal
stenosis (series 4, image 12 and series 2, image 12).

L5-S1: Mild to moderate facet hypertrophy greater on the right.
Trace degenerative facet joint fluid. No spinal or lateral recess
stenosis. Mild to moderate right L5 foraminal stenosis.
IMPRESSION: 1. Symptomatic level appears to be L4-L5 where and extrusion of disc
into the left foramen results in severe stenosis. Query Left L4
radiculitis.
2. Superimposed moderate to severe facet arthropathy from L3-L4
through L5-S1. Disc degeneration also at L2-L3 effecting the
bilateral L2 neural foramina.
3. No other significant left side neural impingement. No lumbar
spinal stenosis.
4. Mild chronic L2 compression fracture. No acute osseous
abnormality.

## 2020-12-05 ENCOUNTER — Other Ambulatory Visit: Payer: Self-pay

## 2020-12-14 ENCOUNTER — Other Ambulatory Visit: Payer: Self-pay

## 2020-12-20 ENCOUNTER — Other Ambulatory Visit: Payer: Self-pay

## 2021-01-30 ENCOUNTER — Other Ambulatory Visit: Payer: Self-pay

## 2021-01-30 MED ORDER — ALBUTEROL SULFATE HFA 108 (90 BASE) MCG/ACT IN AERS
INHALATION_SPRAY | RESPIRATORY_TRACT | 3 refills | Status: AC
  Filled 2021-01-30 – 2021-03-05 (×2): qty 8.5, 16d supply, fill #0
  Filled 2021-03-27: qty 8.5, 16d supply, fill #1
  Filled 2021-07-22: qty 8.5, 16d supply, fill #2

## 2021-01-30 MED FILL — Atorvastatin Calcium Tab 10 MG (Base Equivalent): ORAL | 90 days supply | Qty: 90 | Fill #2 | Status: CN

## 2021-02-12 ENCOUNTER — Other Ambulatory Visit: Payer: Self-pay

## 2021-02-18 ENCOUNTER — Other Ambulatory Visit: Payer: Self-pay

## 2021-03-05 ENCOUNTER — Other Ambulatory Visit: Payer: Self-pay

## 2021-03-27 ENCOUNTER — Other Ambulatory Visit: Payer: Self-pay

## 2021-03-27 MED FILL — Atorvastatin Calcium Tab 10 MG (Base Equivalent): ORAL | 90 days supply | Qty: 90 | Fill #2 | Status: AC

## 2021-03-28 ENCOUNTER — Other Ambulatory Visit: Payer: Self-pay

## 2021-04-12 ENCOUNTER — Other Ambulatory Visit: Payer: Self-pay

## 2021-04-12 DIAGNOSIS — R200 Anesthesia of skin: Secondary | ICD-10-CM | POA: Diagnosis not present

## 2021-04-12 DIAGNOSIS — Z20822 Contact with and (suspected) exposure to covid-19: Secondary | ICD-10-CM | POA: Diagnosis not present

## 2021-04-12 MED ORDER — PREDNISONE 20 MG PO TABS
ORAL_TABLET | ORAL | 0 refills | Status: DC
  Filled 2021-04-12: qty 5, 5d supply, fill #0

## 2021-04-12 MED ORDER — CHLORHEXIDINE GLUCONATE 0.12 % MT SOLN
OROMUCOSAL | 0 refills | Status: AC
  Filled 2021-04-12: qty 473, 15d supply, fill #0

## 2021-04-15 ENCOUNTER — Other Ambulatory Visit: Payer: Self-pay

## 2021-04-25 DIAGNOSIS — Z79899 Other long term (current) drug therapy: Secondary | ICD-10-CM | POA: Diagnosis not present

## 2021-04-25 DIAGNOSIS — J849 Interstitial pulmonary disease, unspecified: Secondary | ICD-10-CM | POA: Diagnosis not present

## 2021-04-25 DIAGNOSIS — M0579 Rheumatoid arthritis with rheumatoid factor of multiple sites without organ or systems involvement: Secondary | ICD-10-CM | POA: Diagnosis not present

## 2021-07-02 ENCOUNTER — Other Ambulatory Visit: Payer: Self-pay

## 2021-07-02 DIAGNOSIS — B9689 Other specified bacterial agents as the cause of diseases classified elsewhere: Secondary | ICD-10-CM | POA: Diagnosis not present

## 2021-07-02 DIAGNOSIS — J019 Acute sinusitis, unspecified: Secondary | ICD-10-CM | POA: Diagnosis not present

## 2021-07-02 MED ORDER — AMOXICILLIN-POT CLAVULANATE 875-125 MG PO TABS
ORAL_TABLET | ORAL | 0 refills | Status: AC
  Filled 2021-07-02: qty 14, 7d supply, fill #0

## 2021-07-02 MED ORDER — PROMETHAZINE-DM 6.25-15 MG/5ML PO SYRP
ORAL_SOLUTION | ORAL | 0 refills | Status: AC
  Filled 2021-07-02: qty 118, 6d supply, fill #0

## 2021-07-22 ENCOUNTER — Other Ambulatory Visit: Payer: Self-pay

## 2021-07-23 ENCOUNTER — Other Ambulatory Visit: Payer: Self-pay

## 2021-07-25 ENCOUNTER — Other Ambulatory Visit: Payer: Self-pay

## 2021-07-25 MED ORDER — METFORMIN HCL 1000 MG PO TABS
ORAL_TABLET | ORAL | 0 refills | Status: DC
  Filled 2021-07-25: qty 60, 30d supply, fill #0

## 2021-07-25 MED ORDER — ATORVASTATIN CALCIUM 10 MG PO TABS
10.0000 mg | ORAL_TABLET | Freq: Every day | ORAL | 0 refills | Status: DC
  Filled 2021-07-25: qty 30, 30d supply, fill #0

## 2021-08-16 ENCOUNTER — Other Ambulatory Visit: Payer: Self-pay

## 2021-08-16 DIAGNOSIS — I10 Essential (primary) hypertension: Secondary | ICD-10-CM | POA: Diagnosis not present

## 2021-08-16 DIAGNOSIS — L309 Dermatitis, unspecified: Secondary | ICD-10-CM | POA: Diagnosis not present

## 2021-08-16 DIAGNOSIS — Z1231 Encounter for screening mammogram for malignant neoplasm of breast: Secondary | ICD-10-CM | POA: Diagnosis not present

## 2021-08-16 DIAGNOSIS — M0579 Rheumatoid arthritis with rheumatoid factor of multiple sites without organ or systems involvement: Secondary | ICD-10-CM | POA: Diagnosis not present

## 2021-08-16 DIAGNOSIS — E119 Type 2 diabetes mellitus without complications: Secondary | ICD-10-CM | POA: Diagnosis not present

## 2021-08-16 DIAGNOSIS — J849 Interstitial pulmonary disease, unspecified: Secondary | ICD-10-CM | POA: Diagnosis not present

## 2021-08-16 DIAGNOSIS — E785 Hyperlipidemia, unspecified: Secondary | ICD-10-CM | POA: Diagnosis not present

## 2021-08-16 DIAGNOSIS — K219 Gastro-esophageal reflux disease without esophagitis: Secondary | ICD-10-CM | POA: Diagnosis not present

## 2021-08-16 DIAGNOSIS — J301 Allergic rhinitis due to pollen: Secondary | ICD-10-CM | POA: Diagnosis not present

## 2021-08-16 DIAGNOSIS — Z7984 Long term (current) use of oral hypoglycemic drugs: Secondary | ICD-10-CM | POA: Diagnosis not present

## 2021-08-16 MED ORDER — HYDROCHLOROTHIAZIDE 25 MG PO TABS
25.0000 mg | ORAL_TABLET | Freq: Every day | ORAL | 3 refills | Status: DC
  Filled 2021-08-16: qty 90, 90d supply, fill #0
  Filled 2021-11-11: qty 90, 90d supply, fill #1
  Filled 2022-03-14: qty 90, 90d supply, fill #2
  Filled 2022-07-14: qty 90, 90d supply, fill #3

## 2021-08-16 MED ORDER — CLOBETASOL PROPIONATE 0.05 % EX OINT
TOPICAL_OINTMENT | CUTANEOUS | 3 refills | Status: DC
  Filled 2021-08-16: qty 30, 15d supply, fill #0
  Filled 2021-11-11: qty 30, 15d supply, fill #1
  Filled 2022-05-02: qty 30, 15d supply, fill #2

## 2021-08-16 MED ORDER — ATORVASTATIN CALCIUM 10 MG PO TABS
10.0000 mg | ORAL_TABLET | Freq: Every day | ORAL | 3 refills | Status: DC
  Filled 2021-08-16 – 2021-09-11 (×2): qty 90, 90d supply, fill #0
  Filled 2021-12-16 – 2022-01-16 (×2): qty 90, 90d supply, fill #1
  Filled 2022-05-29: qty 90, 90d supply, fill #2

## 2021-08-16 MED ORDER — FLUTICASONE PROPIONATE 50 MCG/ACT NA SUSP
NASAL | 12 refills | Status: DC
Start: 2021-08-16 — End: 2022-09-22
  Filled 2021-08-16: qty 16, 60d supply, fill #0

## 2021-08-16 MED ORDER — OMEPRAZOLE 20 MG PO CPDR
20.0000 mg | DELAYED_RELEASE_CAPSULE | Freq: Every day | ORAL | 3 refills | Status: DC
  Filled 2021-08-16: qty 90, 90d supply, fill #0
  Filled 2021-11-11: qty 90, 90d supply, fill #1
  Filled 2022-03-14: qty 90, 90d supply, fill #2
  Filled 2022-07-14: qty 90, 90d supply, fill #3

## 2021-08-16 MED ORDER — ALBUTEROL SULFATE HFA 108 (90 BASE) MCG/ACT IN AERS
INHALATION_SPRAY | RESPIRATORY_TRACT | 3 refills | Status: DC
Start: 2021-08-16 — End: 2022-09-22
  Filled 2021-08-16: qty 8.5, 16d supply, fill #0
  Filled 2022-01-16: qty 8.5, 16d supply, fill #1
  Filled 2022-03-04: qty 8.5, 16d supply, fill #2
  Filled 2022-07-24: qty 8.5, 16d supply, fill #3

## 2021-08-16 MED ORDER — METFORMIN HCL 1000 MG PO TABS
ORAL_TABLET | ORAL | 3 refills | Status: DC
  Filled 2021-08-16 – 2021-09-11 (×2): qty 180, 90d supply, fill #0
  Filled 2022-05-16: qty 180, 90d supply, fill #1

## 2021-08-19 ENCOUNTER — Other Ambulatory Visit: Payer: Self-pay

## 2021-09-02 DIAGNOSIS — Z6836 Body mass index (BMI) 36.0-36.9, adult: Secondary | ICD-10-CM | POA: Diagnosis not present

## 2021-09-02 DIAGNOSIS — J849 Interstitial pulmonary disease, unspecified: Secondary | ICD-10-CM | POA: Diagnosis not present

## 2021-09-02 DIAGNOSIS — M19042 Primary osteoarthritis, left hand: Secondary | ICD-10-CM | POA: Diagnosis not present

## 2021-09-02 DIAGNOSIS — M069 Rheumatoid arthritis, unspecified: Secondary | ICD-10-CM | POA: Diagnosis not present

## 2021-09-02 DIAGNOSIS — M180 Bilateral primary osteoarthritis of first carpometacarpal joints: Secondary | ICD-10-CM | POA: Diagnosis not present

## 2021-09-02 DIAGNOSIS — Z79899 Other long term (current) drug therapy: Secondary | ICD-10-CM | POA: Diagnosis not present

## 2021-09-02 DIAGNOSIS — Z79620 Long term (current) use of immunosuppressive biologic: Secondary | ICD-10-CM | POA: Diagnosis not present

## 2021-09-02 DIAGNOSIS — J8489 Other specified interstitial pulmonary diseases: Secondary | ICD-10-CM | POA: Diagnosis not present

## 2021-09-02 DIAGNOSIS — M0579 Rheumatoid arthritis with rheumatoid factor of multiple sites without organ or systems involvement: Secondary | ICD-10-CM | POA: Diagnosis not present

## 2021-09-02 DIAGNOSIS — Z7982 Long term (current) use of aspirin: Secondary | ICD-10-CM | POA: Diagnosis not present

## 2021-09-02 DIAGNOSIS — M79642 Pain in left hand: Secondary | ICD-10-CM | POA: Diagnosis not present

## 2021-09-02 DIAGNOSIS — Z7984 Long term (current) use of oral hypoglycemic drugs: Secondary | ICD-10-CM | POA: Diagnosis not present

## 2021-09-02 DIAGNOSIS — M359 Systemic involvement of connective tissue, unspecified: Secondary | ICD-10-CM | POA: Diagnosis not present

## 2021-09-02 DIAGNOSIS — M11241 Other chondrocalcinosis, right hand: Secondary | ICD-10-CM | POA: Diagnosis not present

## 2021-09-06 ENCOUNTER — Other Ambulatory Visit: Payer: Self-pay

## 2021-09-11 ENCOUNTER — Other Ambulatory Visit: Payer: Self-pay

## 2021-09-21 DIAGNOSIS — Z03818 Encounter for observation for suspected exposure to other biological agents ruled out: Secondary | ICD-10-CM | POA: Diagnosis not present

## 2021-09-21 DIAGNOSIS — J019 Acute sinusitis, unspecified: Secondary | ICD-10-CM | POA: Diagnosis not present

## 2021-09-21 DIAGNOSIS — B9689 Other specified bacterial agents as the cause of diseases classified elsewhere: Secondary | ICD-10-CM | POA: Diagnosis not present

## 2021-09-21 DIAGNOSIS — J209 Acute bronchitis, unspecified: Secondary | ICD-10-CM | POA: Diagnosis not present

## 2021-10-03 ENCOUNTER — Other Ambulatory Visit: Payer: Self-pay

## 2021-10-03 MED ORDER — NYSTATIN 100000 UNIT/ML MT SUSP
OROMUCOSAL | 0 refills | Status: AC
  Filled 2021-10-03: qty 200, 10d supply, fill #0

## 2021-10-04 ENCOUNTER — Other Ambulatory Visit: Payer: Self-pay

## 2021-10-28 DIAGNOSIS — J8489 Other specified interstitial pulmonary diseases: Secondary | ICD-10-CM | POA: Diagnosis not present

## 2021-10-28 DIAGNOSIS — M359 Systemic involvement of connective tissue, unspecified: Secondary | ICD-10-CM | POA: Diagnosis not present

## 2021-10-28 DIAGNOSIS — Z6837 Body mass index (BMI) 37.0-37.9, adult: Secondary | ICD-10-CM | POA: Diagnosis not present

## 2021-11-11 ENCOUNTER — Other Ambulatory Visit: Payer: Self-pay

## 2021-11-19 ENCOUNTER — Other Ambulatory Visit: Payer: Self-pay

## 2021-11-19 DIAGNOSIS — E119 Type 2 diabetes mellitus without complications: Secondary | ICD-10-CM | POA: Diagnosis not present

## 2021-11-19 DIAGNOSIS — Z6837 Body mass index (BMI) 37.0-37.9, adult: Secondary | ICD-10-CM | POA: Diagnosis not present

## 2021-11-19 DIAGNOSIS — I10 Essential (primary) hypertension: Secondary | ICD-10-CM | POA: Diagnosis not present

## 2021-11-19 DIAGNOSIS — Z23 Encounter for immunization: Secondary | ICD-10-CM | POA: Diagnosis not present

## 2021-11-19 MED ORDER — METFORMIN HCL ER 500 MG PO TB24
2000.0000 mg | ORAL_TABLET | Freq: Every day | ORAL | 3 refills | Status: AC
  Filled 2021-11-19: qty 360, 90d supply, fill #0

## 2021-12-06 ENCOUNTER — Other Ambulatory Visit: Payer: Self-pay

## 2021-12-10 ENCOUNTER — Other Ambulatory Visit: Payer: Self-pay

## 2021-12-10 DIAGNOSIS — J8489 Other specified interstitial pulmonary diseases: Secondary | ICD-10-CM | POA: Diagnosis not present

## 2021-12-10 DIAGNOSIS — J40 Bronchitis, not specified as acute or chronic: Secondary | ICD-10-CM | POA: Diagnosis not present

## 2021-12-10 DIAGNOSIS — M359 Systemic involvement of connective tissue, unspecified: Secondary | ICD-10-CM | POA: Diagnosis not present

## 2021-12-10 DIAGNOSIS — B370 Candidal stomatitis: Secondary | ICD-10-CM | POA: Diagnosis not present

## 2021-12-10 MED ORDER — AMOXICILLIN-POT CLAVULANATE 875-125 MG PO TABS
ORAL_TABLET | ORAL | 0 refills | Status: AC
  Filled 2021-12-10: qty 20, 10d supply, fill #0

## 2021-12-10 MED ORDER — FLUCONAZOLE 100 MG PO TABS
100.0000 mg | ORAL_TABLET | Freq: Every day | ORAL | 1 refills | Status: AC
  Filled 2021-12-10: qty 7, 7d supply, fill #0

## 2021-12-10 MED ORDER — PROMETHAZINE-DM 6.25-15 MG/5ML PO SYRP
ORAL_SOLUTION | ORAL | 0 refills | Status: DC
  Filled 2021-12-10: qty 118, 6d supply, fill #0

## 2021-12-10 MED ORDER — PREDNISONE 20 MG PO TABS
ORAL_TABLET | ORAL | 0 refills | Status: DC
  Filled 2021-12-10: qty 5, 5d supply, fill #0

## 2021-12-16 ENCOUNTER — Other Ambulatory Visit: Payer: Self-pay

## 2022-01-03 ENCOUNTER — Other Ambulatory Visit: Payer: Self-pay

## 2022-01-13 DIAGNOSIS — M359 Systemic involvement of connective tissue, unspecified: Secondary | ICD-10-CM | POA: Diagnosis not present

## 2022-01-13 DIAGNOSIS — E119 Type 2 diabetes mellitus without complications: Secondary | ICD-10-CM | POA: Diagnosis not present

## 2022-01-13 DIAGNOSIS — J849 Interstitial pulmonary disease, unspecified: Secondary | ICD-10-CM | POA: Diagnosis not present

## 2022-01-13 DIAGNOSIS — E785 Hyperlipidemia, unspecified: Secondary | ICD-10-CM | POA: Diagnosis not present

## 2022-01-13 DIAGNOSIS — J8489 Other specified interstitial pulmonary diseases: Secondary | ICD-10-CM | POA: Diagnosis not present

## 2022-01-16 ENCOUNTER — Other Ambulatory Visit: Payer: Self-pay

## 2022-01-30 DIAGNOSIS — L603 Nail dystrophy: Secondary | ICD-10-CM | POA: Diagnosis not present

## 2022-03-04 ENCOUNTER — Other Ambulatory Visit: Payer: Self-pay

## 2022-03-06 ENCOUNTER — Other Ambulatory Visit: Payer: Self-pay

## 2022-03-08 ENCOUNTER — Other Ambulatory Visit: Payer: Self-pay

## 2022-03-08 MED ORDER — MYCOPHENOLATE MOFETIL 500 MG PO TABS
500.0000 mg | ORAL_TABLET | Freq: Every day | ORAL | 2 refills | Status: DC
Start: 2022-03-07 — End: 2023-03-17
  Filled 2022-03-08: qty 90, 90d supply, fill #0
  Filled 2022-07-01: qty 90, 90d supply, fill #1
  Filled 2022-09-22: qty 90, 90d supply, fill #2

## 2022-03-10 ENCOUNTER — Other Ambulatory Visit: Payer: Self-pay

## 2022-03-26 DIAGNOSIS — S99929A Unspecified injury of unspecified foot, initial encounter: Secondary | ICD-10-CM | POA: Diagnosis not present

## 2022-03-26 DIAGNOSIS — E669 Obesity, unspecified: Secondary | ICD-10-CM | POA: Diagnosis not present

## 2022-03-26 DIAGNOSIS — L602 Onychogryphosis: Secondary | ICD-10-CM | POA: Diagnosis not present

## 2022-03-26 DIAGNOSIS — S90211D Contusion of right great toe with damage to nail, subsequent encounter: Secondary | ICD-10-CM | POA: Diagnosis not present

## 2022-03-26 DIAGNOSIS — E119 Type 2 diabetes mellitus without complications: Secondary | ICD-10-CM | POA: Diagnosis not present

## 2022-03-26 DIAGNOSIS — E1142 Type 2 diabetes mellitus with diabetic polyneuropathy: Secondary | ICD-10-CM | POA: Diagnosis not present

## 2022-04-09 ENCOUNTER — Other Ambulatory Visit: Payer: Self-pay

## 2022-04-09 DIAGNOSIS — M5442 Lumbago with sciatica, left side: Secondary | ICD-10-CM | POA: Diagnosis not present

## 2022-04-09 DIAGNOSIS — M48062 Spinal stenosis, lumbar region with neurogenic claudication: Secondary | ICD-10-CM | POA: Diagnosis not present

## 2022-04-09 DIAGNOSIS — G8929 Other chronic pain: Secondary | ICD-10-CM | POA: Diagnosis not present

## 2022-04-09 MED ORDER — NAPROXEN 500 MG PO TABS
500.0000 mg | ORAL_TABLET | Freq: Two times a day (BID) | ORAL | 3 refills | Status: DC | PRN
  Filled 2022-04-09: qty 60, 30d supply, fill #0

## 2022-04-14 ENCOUNTER — Other Ambulatory Visit: Payer: Self-pay

## 2022-05-02 ENCOUNTER — Other Ambulatory Visit: Payer: Self-pay

## 2022-05-16 ENCOUNTER — Other Ambulatory Visit: Payer: Self-pay

## 2022-05-29 ENCOUNTER — Other Ambulatory Visit: Payer: Self-pay

## 2022-07-01 ENCOUNTER — Other Ambulatory Visit: Payer: Self-pay

## 2022-07-14 ENCOUNTER — Other Ambulatory Visit: Payer: Self-pay

## 2022-07-18 ENCOUNTER — Other Ambulatory Visit: Payer: Self-pay

## 2022-07-18 DIAGNOSIS — Z03818 Encounter for observation for suspected exposure to other biological agents ruled out: Secondary | ICD-10-CM | POA: Diagnosis not present

## 2022-07-18 DIAGNOSIS — J209 Acute bronchitis, unspecified: Secondary | ICD-10-CM | POA: Diagnosis not present

## 2022-07-18 DIAGNOSIS — J019 Acute sinusitis, unspecified: Secondary | ICD-10-CM | POA: Diagnosis not present

## 2022-07-18 DIAGNOSIS — B9689 Other specified bacterial agents as the cause of diseases classified elsewhere: Secondary | ICD-10-CM | POA: Diagnosis not present

## 2022-07-18 DIAGNOSIS — B3731 Acute candidiasis of vulva and vagina: Secondary | ICD-10-CM | POA: Diagnosis not present

## 2022-07-18 MED ORDER — HYDROCODONE BIT-HOMATROP MBR 5-1.5 MG/5ML PO SOLN
5.0000 mL | Freq: Four times a day (QID) | ORAL | 0 refills | Status: DC | PRN
  Filled 2022-07-18: qty 60, 3d supply, fill #0

## 2022-07-18 MED ORDER — FLUCONAZOLE 150 MG PO TABS
150.0000 mg | ORAL_TABLET | Freq: Once | ORAL | 2 refills | Status: AC
  Filled 2022-07-18: qty 1, 1d supply, fill #0

## 2022-07-18 MED ORDER — PREDNISONE 20 MG PO TABS
20.0000 mg | ORAL_TABLET | Freq: Two times a day (BID) | ORAL | 0 refills | Status: DC
  Filled 2022-07-18: qty 10, 5d supply, fill #0

## 2022-07-18 MED ORDER — AMOXICILLIN-POT CLAVULANATE 875-125 MG PO TABS
1.0000 | ORAL_TABLET | Freq: Two times a day (BID) | ORAL | 0 refills | Status: AC
  Filled 2022-07-18: qty 20, 10d supply, fill #0

## 2022-07-24 ENCOUNTER — Other Ambulatory Visit: Payer: Self-pay

## 2022-07-24 DIAGNOSIS — J209 Acute bronchitis, unspecified: Secondary | ICD-10-CM | POA: Diagnosis not present

## 2022-07-24 MED ORDER — HYDROCOD POLI-CHLORPHE POLI ER 10-8 MG/5ML PO SUER
ORAL | 0 refills | Status: AC
  Filled 2022-07-24: qty 60, 6d supply, fill #0

## 2022-07-24 MED ORDER — DOXYCYCLINE HYCLATE 100 MG PO TABS
100.0000 mg | ORAL_TABLET | Freq: Two times a day (BID) | ORAL | 0 refills | Status: AC
  Filled 2022-07-24: qty 20, 10d supply, fill #0

## 2022-07-29 ENCOUNTER — Other Ambulatory Visit: Payer: Self-pay

## 2022-07-29 MED ORDER — NYSTATIN 100000 UNIT/ML MT SUSP
5.0000 mL | Freq: Four times a day (QID) | OROMUCOSAL | 0 refills | Status: AC
  Filled 2022-07-29: qty 200, 10d supply, fill #0

## 2022-07-31 ENCOUNTER — Other Ambulatory Visit: Payer: Self-pay

## 2022-07-31 DIAGNOSIS — M0579 Rheumatoid arthritis with rheumatoid factor of multiple sites without organ or systems involvement: Secondary | ICD-10-CM | POA: Diagnosis not present

## 2022-07-31 DIAGNOSIS — M859 Disorder of bone density and structure, unspecified: Secondary | ICD-10-CM | POA: Diagnosis not present

## 2022-07-31 DIAGNOSIS — E119 Type 2 diabetes mellitus without complications: Secondary | ICD-10-CM | POA: Diagnosis not present

## 2022-07-31 DIAGNOSIS — Z0000 Encounter for general adult medical examination without abnormal findings: Secondary | ICD-10-CM | POA: Diagnosis not present

## 2022-07-31 DIAGNOSIS — M359 Systemic involvement of connective tissue, unspecified: Secondary | ICD-10-CM | POA: Diagnosis not present

## 2022-07-31 DIAGNOSIS — I10 Essential (primary) hypertension: Secondary | ICD-10-CM | POA: Diagnosis not present

## 2022-07-31 DIAGNOSIS — R2231 Localized swelling, mass and lump, right upper limb: Secondary | ICD-10-CM | POA: Diagnosis not present

## 2022-07-31 DIAGNOSIS — J8489 Other specified interstitial pulmonary diseases: Secondary | ICD-10-CM | POA: Diagnosis not present

## 2022-08-01 ENCOUNTER — Emergency Department: Payer: Commercial Managed Care - PPO

## 2022-08-01 ENCOUNTER — Emergency Department
Admission: EM | Admit: 2022-08-01 | Discharge: 2022-08-01 | Disposition: A | Discharge status: Home / Self Care | Payer: Commercial Managed Care - PPO | Attending: Emergency Medicine | Admitting: Emergency Medicine

## 2022-08-01 ENCOUNTER — Other Ambulatory Visit: Payer: Self-pay

## 2022-08-01 DIAGNOSIS — I700 Atherosclerosis of aorta: Secondary | ICD-10-CM | POA: Diagnosis not present

## 2022-08-01 DIAGNOSIS — R531 Weakness: Secondary | ICD-10-CM

## 2022-08-01 DIAGNOSIS — R918 Other nonspecific abnormal finding of lung field: Secondary | ICD-10-CM | POA: Diagnosis not present

## 2022-08-01 DIAGNOSIS — J029 Acute pharyngitis, unspecified: Secondary | ICD-10-CM | POA: Diagnosis not present

## 2022-08-01 DIAGNOSIS — J849 Interstitial pulmonary disease, unspecified: Secondary | ICD-10-CM | POA: Diagnosis not present

## 2022-08-01 LAB — CBC
HCT: 46.1 % — ABNORMAL HIGH (ref 36.0–46.0)
Hemoglobin: 14.6 g/dL (ref 12.0–15.0)
MCH: 25.8 pg — ABNORMAL LOW (ref 26.0–34.0)
MCHC: 31.7 g/dL (ref 30.0–36.0)
MCV: 81.6 fL (ref 80.0–100.0)
Platelets: 454 10*3/uL — ABNORMAL HIGH (ref 150–400)
RBC: 5.65 MIL/uL — ABNORMAL HIGH (ref 3.87–5.11)
RDW: 13 % (ref 11.5–15.5)
WBC: 12.4 10*3/uL — ABNORMAL HIGH (ref 4.0–10.5)
nRBC: 0 % (ref 0.0–0.2)

## 2022-08-01 LAB — BASIC METABOLIC PANEL
Anion gap: 11 (ref 5–15)
BUN: 12 mg/dL (ref 8–23)
CO2: 29 mmol/L (ref 22–32)
Calcium: 9.3 mg/dL (ref 8.9–10.3)
Chloride: 94 mmol/L — ABNORMAL LOW (ref 98–111)
Creatinine, Ser: 0.85 mg/dL (ref 0.44–1.00)
GFR, Estimated: >60 mL/min (ref >60)
Glucose, Bld: 193 mg/dL — ABNORMAL HIGH (ref 70–99)
Potassium: 3.6 mmol/L (ref 3.5–5.1)
Sodium: 134 mmol/L — ABNORMAL LOW (ref 135–145)

## 2022-08-01 LAB — TROPONIN I (HIGH SENSITIVITY): Troponin I (High Sensitivity): 4 ng/L (ref <18)

## 2022-08-01 MED ORDER — PREDNISONE 10 MG PO TABS
ORAL_TABLET | ORAL | 0 refills | Status: AC
  Filled 2022-08-01: qty 30, 12d supply, fill #0

## 2022-08-01 MED ORDER — ONDANSETRON 4 MG PO TBDP
4.0000 mg | ORAL_TABLET | Freq: Once | ORAL | Status: AC
  Administered 2022-08-01: 4 mg via ORAL
  Filled 2022-08-01: qty 1

## 2022-08-01 MED ORDER — ONDANSETRON 4 MG PO TBDP
4.0000 mg | ORAL_TABLET | Freq: Three times a day (TID) | ORAL | 0 refills | Status: AC | PRN
  Filled 2022-08-01: qty 20, 7d supply, fill #0

## 2022-08-01 MED ORDER — IBUPROFEN 600 MG PO TABS
600.0000 mg | ORAL_TABLET | Freq: Three times a day (TID) | ORAL | 0 refills | Status: AC | PRN
  Filled 2022-08-01: qty 20, 7d supply, fill #0

## 2022-08-01 MED ORDER — PREDNISONE 20 MG PO TABS
40.0000 mg | ORAL_TABLET | Freq: Once | ORAL | Status: AC
  Administered 2022-08-01: 40 mg via ORAL
  Filled 2022-08-01: qty 2

## 2022-08-01 NOTE — Discharge Instructions (Signed)
Take the steroids as prescribed with food  Use over-the-counter Flonase for your nasal congestion  Take Ibuprofen for sore throat  Use over-the-counter sore throat lozenges  Drink plenty of fluids  Take the prednisone taper as prescribed

## 2022-08-01 NOTE — ED Provider Notes (Signed)
[facility] Provider Note    Event Date/Time   First MD Initiated Contact with Patient 08/01/22 1521     (approximate)   History   Weakness   HPI  Amber Aguilar is a 67 y.o. female  here with generalized weakness. Pt has a h/o ILD, RA, has been ont wo recent courses of ABX for bronchitis and states she feels fatigued, tired. She felt nauseous throughout the day yesterday as well. Her cough has persisted since onset and she has had ongoing nasal congestion and drainage, along with sore throat that is worse w/ swallowing. No hoarseness. Presents because she feels like her medications aren't working. No fevers, chills.       Physical Exam   Triage Vital Signs: ED Triage Vitals  Enc Vitals Group     BP 08/01/22 1335 (!) 143/64     Pulse Rate 08/01/22 1335 99     Resp 08/01/22 1335 17     Temp 08/01/22 1335 98.8 F (37.1 C)     Temp Source 08/01/22 1335 Oral     SpO2 08/01/22 1335 99 %     Weight 08/01/22 1336 198 lb (89.8 kg)     Height 08/01/22 1336 5\' 2"  (1.575 m)     Head Circumference --      Peak Flow --      Pain Score 08/01/22 1335 0     Pain Loc --      Pain Edu? --      Excl. in GC? --     Most recent vital signs: Vitals:   08/01/22 1335  BP: (!) 143/64  Pulse: 99  Resp: 17  Temp: 98.8 F (37.1 C)  SpO2: 99%     General: Awake, no distress.  CV:  Good peripheral perfusion. RRR. Resp:  Normal work of breathing. Lungs CTAB with exception of mild course breath sounds in bilateral bases. Abd:  No distention. No TTP.  Other:  MMM. Well appearing and in NAD.   ED Results / Procedures / Treatments   Labs (all labs ordered are listed, but only abnormal results are displayed) Labs Reviewed  BASIC METABOLIC PANEL - Abnormal; Notable for the following components:      Result Value   Sodium 134 (*)    Chloride 94 (*)    Glucose, Bld 193 (*)    All other components within normal limits  CBC - Abnormal; Notable for the  following components:   WBC 12.4 (*)    RBC 5.65 (*)    HCT 46.1 (*)    MCH 25.8 (*)    Platelets 454 (*)    All other components within normal limits  TROPONIN I (HIGH SENSITIVITY)  TROPONIN I (HIGH SENSITIVITY)     EKG Normal sinus rhythm, VR 92. PR 146, QRS 68, QTc 440. No acute ST elevations or depressions. No ischemia or infarct.   RADIOLOGY CXR: band like and reticular opacities largely unchanged from prior   I also independently reviewed and agree with radiologist interpretations.   PROCEDURES:  Critical Care performed: No   MEDICATIONS ORDERED IN ED: Medications  ondansetron (ZOFRAN-ODT) disintegrating tablet 4 mg (4 mg Oral Given 08/01/22 1641)  predniSONE (DELTASONE) tablet 40 mg (40 mg Oral Given 08/01/22 1639)     IMPRESSION / MDM / ASSESSMENT AND PLAN / ED COURSE  I reviewed the triage vital signs and the nursing notes.  Differential diagnosis includes, but is not limited to, ongoing bronchitis, RAD, ILD, sinusitis, postnasal drainage, infectious bronchitis/URI, dehydration, medication adverse effect  Patient's presentation is most consistent with acute presentation with potential threat to life or bodily function.  The patient is on the cardiac monitor to evaluate for evidence of arrhythmia and/or significant heart rate changes  67 yo F with PMHx RA, DM, here with generalized weakness in setting of recent infection on ABX. No signs of ongoing infection/sepsis. No fevers. I suspect she has a combination of mild GI upset 2/2 her recurrent ABX use. I suspect she may also have an underlying component of ILD/bronchitis, without significant bacterial component as she has now been on 2 near full rounds of ABX. Will give her a slower taper of prednisone as she had a mild improvement initially then return of sx, and given her ILD and long term intermittent prednisone use, I susepct she would benefit more from a slower taper. She has an appt  with her Pulmonologist on Monday, will encourage her albuterol at home, hydration, and good return precautions. No other apparent emergent medical condition.   FINAL CLINICAL IMPRESSION(S) / ED DIAGNOSES   Final diagnoses:  Sore throat  Generalized weakness  ILD (interstitial lung disease) (HCC)     Rx / DC Orders   ED Discharge Orders          Ordered    predniSONE (DELTASONE) 10 MG tablet  Multiple Frequencies        08/01/22 1647    ondansetron (ZOFRAN-ODT) 4 MG disintegrating tablet  Every 8 hours PRN        08/01/22 1647    ibuprofen (ADVIL) 600 MG tablet  Every 8 hours PRN        08/01/22 1647             Note:  This document was prepared using Dragon voice recognition software and may include unintentional dictation errors.   Shaune Pollack, MD 08/01/22 1650

## 2022-08-01 NOTE — ED Triage Notes (Signed)
Pt sts that she has been having weakness. Pt sts that yesterday she had a episode of vomiting but since than she has not had any.

## 2022-08-01 NOTE — ED Notes (Signed)
Patient is sitting in the lobby, on the phone. No distress noted.

## 2022-08-04 DIAGNOSIS — R059 Cough, unspecified: Secondary | ICD-10-CM | POA: Diagnosis not present

## 2022-08-04 DIAGNOSIS — M0510 Rheumatoid lung disease with rheumatoid arthritis of unspecified site: Secondary | ICD-10-CM | POA: Diagnosis not present

## 2022-08-04 DIAGNOSIS — E119 Type 2 diabetes mellitus without complications: Secondary | ICD-10-CM | POA: Diagnosis not present

## 2022-08-04 DIAGNOSIS — J302 Other seasonal allergic rhinitis: Secondary | ICD-10-CM | POA: Diagnosis not present

## 2022-08-04 DIAGNOSIS — J849 Interstitial pulmonary disease, unspecified: Secondary | ICD-10-CM | POA: Diagnosis not present

## 2022-08-04 DIAGNOSIS — R600 Localized edema: Secondary | ICD-10-CM | POA: Diagnosis not present

## 2022-08-10 ENCOUNTER — Other Ambulatory Visit: Payer: Self-pay

## 2022-08-10 ENCOUNTER — Encounter: Payer: Self-pay | Admitting: Emergency Medicine

## 2022-08-10 ENCOUNTER — Emergency Department: Payer: Commercial Managed Care - PPO

## 2022-08-10 ENCOUNTER — Emergency Department
Admission: EM | Admit: 2022-08-10 | Discharge: 2022-08-10 | Disposition: A | Discharge status: Home / Self Care | Payer: Commercial Managed Care - PPO | Attending: Emergency Medicine | Admitting: Emergency Medicine

## 2022-08-10 DIAGNOSIS — R918 Other nonspecific abnormal finding of lung field: Secondary | ICD-10-CM | POA: Diagnosis not present

## 2022-08-10 DIAGNOSIS — Z1152 Encounter for screening for COVID-19: Secondary | ICD-10-CM | POA: Diagnosis not present

## 2022-08-10 DIAGNOSIS — J849 Interstitial pulmonary disease, unspecified: Secondary | ICD-10-CM | POA: Insufficient documentation

## 2022-08-10 DIAGNOSIS — R051 Acute cough: Secondary | ICD-10-CM

## 2022-08-10 DIAGNOSIS — R059 Cough, unspecified: Secondary | ICD-10-CM | POA: Diagnosis not present

## 2022-08-10 DIAGNOSIS — R0989 Other specified symptoms and signs involving the circulatory and respiratory systems: Secondary | ICD-10-CM | POA: Diagnosis not present

## 2022-08-10 LAB — SARS CORONAVIRUS 2 BY RT PCR: SARS Coronavirus 2 by RT PCR: NEGATIVE

## 2022-08-10 MED ORDER — PREDNISONE 10 MG PO TABS
ORAL_TABLET | ORAL | 0 refills | Status: AC
  Filled 2022-08-10: qty 28, 10d supply, fill #0

## 2022-08-10 MED ORDER — PREDNISONE 20 MG PO TABS
60.0000 mg | ORAL_TABLET | Freq: Once | ORAL | Status: AC
  Administered 2022-08-10: 60 mg via ORAL
  Filled 2022-08-10: qty 3

## 2022-08-10 MED ORDER — IPRATROPIUM-ALBUTEROL 0.5-2.5 (3) MG/3ML IN SOLN
6.0000 mL | Freq: Once | RESPIRATORY_TRACT | Status: AC
  Administered 2022-08-10: 6 mL via RESPIRATORY_TRACT
  Filled 2022-08-10: qty 3

## 2022-08-10 MED ORDER — IPRATROPIUM-ALBUTEROL 0.5-2.5 (3) MG/3ML IN SOLN
6.0000 mL | Freq: Once | RESPIRATORY_TRACT | Status: AC
  Administered 2022-08-10: 6 mL via RESPIRATORY_TRACT
  Filled 2022-08-10: qty 6

## 2022-08-10 NOTE — ED Triage Notes (Addendum)
Pt c/o continued congestion and sinus pressure states she has been seen here and [facility] recently  prescribed prednisone.  No distress noted on arrival.

## 2022-08-10 NOTE — ED Provider Notes (Signed)
[facility] Provider Note    Event Date/Time   First MD Initiated Contact with Patient 08/10/22 [phone]     (approximate)   History   Facial Pain and Cough   HPI  Amber Aguilar is a 67 y.o. female who presents to the ED for evaluation of Facial Pain and Cough   I reviewed pulmonary clinic visit from 6/24.  History of ILD, RA. On Cellcept  Patient reports that she is currently finishing a prednisone taper and Augmentin provided by an urgent care.  Despite this, she reports persistent minimally productive cough in the past couple weeks that will not go away.  Improved with her inhaler at home, but always returns.  No fevers, pain, increased production, syncope  Physical Exam   Triage Vital Signs: ED Triage Vitals  Enc Vitals Group     BP 08/10/22 0544 136/63     Pulse Rate 08/10/22 0544 81     Resp 08/10/22 0544 18     Temp 08/10/22 0544 98.8 F (37.1 C)     Temp src --      SpO2 08/10/22 0544 96 %     Weight 08/10/22 0545 198 lb 6.6 oz (90 kg)     Height 08/10/22 0545 5\' 2"  (1.575 m)     Head Circumference --      Peak Flow --      Pain Score 08/10/22 0545 0     Pain Loc --      Pain Edu? --      Excl. in GC? --     Most recent vital signs: Vitals:   08/10/22 0544  BP: 136/63  Pulse: 81  Resp: 18  Temp: 98.8 F (37.1 C)  SpO2: 96%    General: Awake, no distress.  Ambulatory independently, looks well, pleasant and conversational full sentences CV:  Good peripheral perfusion.  Resp:  Normal effort.  Diffuse wheezing and slightly decreased airflow throughout Abd:  No distention.  MSK:  No deformity noted.  Neuro:  No focal deficits appreciated. Other:     ED Results / Procedures / Treatments   Labs (all labs ordered are listed, but only abnormal results are displayed) Labs Reviewed  SARS CORONAVIRUS 2 BY RT PCR    EKG   RADIOLOGY CXR interpreted by me without evidence of acute cardiopulmonary pathology.  Official  radiology report(s): DG Chest 2 View  Result Date: 08/10/2022 CLINICAL DATA:  67 year old female with history of worsening cough. EXAM: CHEST - 2 VIEW COMPARISON:  Chest x-ray 08/01/2022. FINDINGS: Lung volumes are low. No consolidative airspace disease. Widespread areas of interstitial prominence are again noted throughout the mid to lower lungs bilaterally. No pleural effusions. No pneumothorax. No pulmonary nodule or mass noted. Pulmonary vasculature and the cardiomediastinal silhouette are within normal limits. IMPRESSION: 1. No radiographic evidence of acute cardiopulmonary disease. 2. Chronic changes suggestive of interstitial lung disease redemonstrated, as above. Follow-up nonemergent outpatient high-resolution chest CT should be considered in the near future to better characterize these findings. Electronically Signed   By: Trudie Reed M.D.   On: 08/10/2022 06:37    PROCEDURES and INTERVENTIONS:  Procedures  Medications  ipratropium-albuterol (DUONEB) 0.5-2.5 (3) MG/3ML nebulizer solution 6 mL (6 mLs Nebulization Given 08/10/22 0606)  predniSONE (DELTASONE) tablet 60 mg (60 mg Oral Given 08/10/22 0606)  ipratropium-albuterol (DUONEB) 0.5-2.5 (3) MG/3ML nebulizer solution 6 mL (6 mLs Nebulization Given 08/10/22 0641)     IMPRESSION / MDM / ASSESSMENT AND PLAN /  ED COURSE  I reviewed the triage vital signs and the nursing notes.  Differential diagnosis includes, but is not limited to, ILD Bashan, wheezing, pneumothorax, pneumonia  {Patient presents with symptoms of an acute illness or injury that is potentially life-threatening.  Patient presents with cough that I suspect is due to her ILD and wheezing.  Improving symptoms with breathing treatments.  She is finishing a steroid taper but I suspect she would benefit from going back up to higher doses considering her ongoing symptoms.  She is already on Augmentin and no indications for expanding or changing antibiotics.  Will reassess  after second breathing treatment I anticipate she will be suitable for outpatient management.  COVID test pending.  Clinical Course as of 08/10/22 1610  Wynelle Link Aug 10, 2022  0636 Reassessed, feeling better after the DuoNeb.  Still with some wheezing and we will provide a second round of breathing treatments. [DS]  9604 Reassessed.  Feeling well.  Suitable for discharge [DS]    Clinical Course User Index [DS] Delton Prairie, MD     FINAL CLINICAL IMPRESSION(S) / ED DIAGNOSES   Final diagnoses:  Acute cough  ILD (interstitial lung disease) (HCC)     Rx / DC Orders   ED Discharge Orders          Ordered    predniSONE (STERAPRED UNI-PAK 21 TAB) 10 MG (21) TBPK tablet  Daily        08/10/22 5409             Note:  This document was prepared using Dragon voice recognition software and may include unintentional dictation errors.   Delton Prairie, MD 08/10/22 [phone]

## 2022-08-10 NOTE — ED Triage Notes (Signed)
Patient endorses cough/sinus pressure x 4 weeks.  Patient seen multiple times for same at [facility] clinic.

## 2022-08-22 DIAGNOSIS — Z0000 Encounter for general adult medical examination without abnormal findings: Secondary | ICD-10-CM | POA: Diagnosis not present

## 2022-08-22 DIAGNOSIS — Z1231 Encounter for screening mammogram for malignant neoplasm of breast: Secondary | ICD-10-CM | POA: Diagnosis not present

## 2022-09-22 ENCOUNTER — Other Ambulatory Visit: Payer: Self-pay

## 2022-09-22 MED ORDER — ALBUTEROL SULFATE HFA 108 (90 BASE) MCG/ACT IN AERS
2.0000 | INHALATION_SPRAY | RESPIRATORY_TRACT | 3 refills | Status: AC | PRN
  Filled 2022-09-22: qty 6.7, 25d supply, fill #0
  Filled 2023-03-17: qty 6.7, 25d supply, fill #1

## 2022-09-22 MED ORDER — ATORVASTATIN CALCIUM 10 MG PO TABS
10.0000 mg | ORAL_TABLET | Freq: Every day | ORAL | 3 refills | Status: AC
  Filled 2022-09-22: qty 90, 90d supply, fill #0
  Filled 2023-02-19: qty 90, 90d supply, fill #1
  Filled 2023-06-24: qty 90, 90d supply, fill #2

## 2022-09-22 MED ORDER — CLOBETASOL PROPIONATE 0.05 % EX OINT
TOPICAL_OINTMENT | CUTANEOUS | 3 refills | Status: AC
  Filled 2022-09-22: qty 30, 30d supply, fill #0

## 2022-09-22 MED ORDER — HYDROCHLOROTHIAZIDE 25 MG PO TABS
25.0000 mg | ORAL_TABLET | Freq: Every day | ORAL | 3 refills | Status: AC
  Filled 2022-09-22: qty 90, 90d supply, fill #0
  Filled 2023-03-24: qty 90, 90d supply, fill #1
  Filled 2023-07-06: qty 90, 90d supply, fill #2

## 2022-09-22 MED ORDER — FLUTICASONE PROPIONATE 50 MCG/ACT NA SUSP
1.0000 | Freq: Every day | NASAL | 12 refills | Status: AC
  Filled 2022-09-22: qty 16, 30d supply, fill #0

## 2022-09-22 MED ORDER — METFORMIN HCL 1000 MG PO TABS
1000.0000 mg | ORAL_TABLET | Freq: Two times a day (BID) | ORAL | 3 refills | Status: AC
  Filled 2022-09-22: qty 180, 90d supply, fill #0

## 2022-09-22 MED ORDER — OMEPRAZOLE 20 MG PO CPDR
20.0000 mg | DELAYED_RELEASE_CAPSULE | Freq: Every day | ORAL | 3 refills | Status: AC
  Filled 2022-09-22: qty 90, 90d supply, fill #0
  Filled 2023-03-17: qty 90, 90d supply, fill #1
  Filled 2023-06-30: qty 90, 90d supply, fill #2

## 2022-09-29 DIAGNOSIS — U071 COVID-19: Secondary | ICD-10-CM | POA: Diagnosis not present

## 2022-09-30 ENCOUNTER — Other Ambulatory Visit: Payer: Self-pay

## 2022-09-30 MED ORDER — PAXLOVID (300/100) 20 X 150 MG & 10 X 100MG PO TBPK
ORAL_TABLET | ORAL | 0 refills | Status: DC
  Filled 2022-09-30: qty 30, 5d supply, fill #0

## 2022-12-15 ENCOUNTER — Other Ambulatory Visit: Payer: Self-pay

## 2022-12-15 MED ORDER — FLUTICASONE PROPIONATE 50 MCG/ACT NA SUSP
1.0000 | Freq: Every day | NASAL | 12 refills | Status: AC
  Filled 2022-12-15 – 2022-12-30 (×2): qty 16, 60d supply, fill #0

## 2022-12-25 ENCOUNTER — Other Ambulatory Visit: Payer: Self-pay

## 2022-12-30 ENCOUNTER — Other Ambulatory Visit: Payer: Self-pay

## 2023-01-12 ENCOUNTER — Other Ambulatory Visit: Payer: Self-pay

## 2023-01-27 ENCOUNTER — Other Ambulatory Visit: Payer: Self-pay

## 2023-01-27 MED ORDER — MUPIROCIN 2 % EX OINT: TOPICAL_OINTMENT | Freq: Three times a day (TID) | CUTANEOUS | 0 refills | Status: AC

## 2023-01-27 MED ORDER — GABAPENTIN 100 MG PO CAPS: 100.0000 mg | ORAL_CAPSULE | Freq: Every evening | ORAL | 3 refills | Status: AC

## 2023-02-19 ENCOUNTER — Other Ambulatory Visit: Payer: Self-pay

## 2023-02-25 ENCOUNTER — Other Ambulatory Visit: Payer: Self-pay

## 2023-03-04 ENCOUNTER — Other Ambulatory Visit: Payer: Self-pay

## 2023-03-04 MED ORDER — PAXLOVID (300/100) 20 X 150 MG & 10 X 100MG PO TBPK
ORAL_TABLET | ORAL | 0 refills | Status: AC
  Filled 2023-03-04: qty 30, 5d supply, fill #0

## 2023-03-06 ENCOUNTER — Other Ambulatory Visit: Payer: Self-pay

## 2023-03-06 ENCOUNTER — Emergency Department
Admission: EM | Admit: 2023-03-06 | Discharge: 2023-03-06 | Disposition: A | Discharge status: Home / Self Care | Payer: Medicare Other | Attending: Emergency Medicine | Admitting: Emergency Medicine

## 2023-03-06 ENCOUNTER — Encounter: Payer: Self-pay | Admitting: Medical Oncology

## 2023-03-06 DIAGNOSIS — B9789 Other viral agents as the cause of diseases classified elsewhere: Secondary | ICD-10-CM | POA: Diagnosis not present

## 2023-03-06 DIAGNOSIS — J029 Acute pharyngitis, unspecified: Secondary | ICD-10-CM | POA: Insufficient documentation

## 2023-03-06 DIAGNOSIS — J028 Acute pharyngitis due to other specified organisms: Secondary | ICD-10-CM | POA: Diagnosis not present

## 2023-03-06 DIAGNOSIS — Z20822 Contact with and (suspected) exposure to covid-19: Secondary | ICD-10-CM | POA: Insufficient documentation

## 2023-03-06 LAB — RESP PANEL BY RT-PCR (RSV, FLU A&B, COVID)  RVPGX2
Influenza A by PCR: NEGATIVE
Influenza B by PCR: NEGATIVE
Resp Syncytial Virus by PCR: NEGATIVE
SARS Coronavirus 2 by RT PCR: NEGATIVE

## 2023-03-06 LAB — GROUP A STREP BY PCR: Group A Strep by PCR: NOT DETECTED

## 2023-03-06 MED ORDER — LIDOCAINE VISCOUS HCL 2 % MT SOLN
15.0000 mL | OROMUCOSAL | 0 refills | Status: AC | PRN
  Filled 2023-03-06: qty 100, 2d supply, fill #0

## 2023-03-06 MED ORDER — LIDOCAINE VISCOUS HCL 2 % MT SOLN
15.0000 mL | Freq: Once | OROMUCOSAL | Status: AC
  Administered 2023-03-06: 15 mL via OROMUCOSAL
  Filled 2023-03-06: qty 15

## 2023-03-06 MED ORDER — DEXAMETHASONE 4 MG PO TABS
4.0000 mg | ORAL_TABLET | Freq: Once | ORAL | Status: AC
  Administered 2023-03-06: 4 mg via ORAL
  Filled 2023-03-06: qty 1

## 2023-03-06 NOTE — Discharge Instructions (Addendum)
You were seen in the ER today for your sore throat.  I suspect this is related to a viral infection.  We gave you a dose of steroids in the ER.  I sent a prescription for numbing solution to your pharmacy that you can use as needed to help with your sore throat.  Follow with your primary care doctor for further evaluation.  Return to the ER for new or worsening symptoms.

## 2023-03-06 NOTE — ED Provider Notes (Signed)
[facility] Provider Note    Event Date/Time   First MD Initiated Contact with Patient 03/06/23 1127     (approximate)   History   Sore Throat   HPI  Amber Aguilar is a 68 year old female presenting to the emergency department for evaluation of sore throat.  Symptoms started on Sunday.  Went to urgent care and was diagnosed with COVID but reports she later took a home swab that was negative.  No noted fevers at home.  No chest pain or shortness of breath.  Was prescribed Paxlovid, but reports ongoing symptoms.  Tolerating p.o., but reports decreased intake due to sore throat.     Physical Exam   Triage Vital Signs: ED Triage Vitals  Encounter Vitals Group     BP 03/06/23 1032 (!) 146/71     Systolic BP Percentile --      Diastolic BP Percentile --      Pulse Rate 03/06/23 1032 75     Resp 03/06/23 1032 18     Temp 03/06/23 1032 98.1 F (36.7 C)     Temp Source 03/06/23 1032 Oral     SpO2 03/06/23 1032 97 %     Weight 03/06/23 1033 194 lb (88 kg)     Height 03/06/23 1033 5\' 2"  (1.575 m)     Head Circumference --      Peak Flow --      Pain Score 03/06/23 1033 10     Pain Loc --      Pain Education --      Exclude from Growth Chart --     Most recent vital signs: Vitals:   03/06/23 1032  BP: (!) 146/71  Pulse: 75  Resp: 18  Temp: 98.1 F (36.7 C)  SpO2: 97%     General: Awake, interactive  HEENT: Mild erythema the posterior oropharynx.  No uvular deviation or swelling, no tonsillar exudates, no swelling underneath the tongue.  Managing secretions without issue. CV:  Regular rate, good peripheral perfusion.  Resp:  Unlabored respirations, lungs good auscultation and Abd:  Nondistended, soft, nontender to palpation Neuro:  Symmetric facial movement, fluid speech   ED Results / Procedures / Treatments   Labs (all labs ordered are listed, but only abnormal results are displayed) Labs Reviewed  GROUP A STREP BY PCR  RESP PANEL  BY RT-PCR (RSV, FLU A&B, COVID)  [id number]     EKG EKG independently reviewed interpreted by myself (ER attending) demonstrates:    RADIOLOGY Imaging independently reviewed and interpreted by myself demonstrates:    PROCEDURES:  Critical Care performed: No  Procedures   MEDICATIONS ORDERED IN ED: Medications  dexamethasone (DECADRON) tablet 4 mg (has no administration in time range)  lidocaine (XYLOCAINE) 2 % viscous mouth solution 15 mL (has no administration in time range)     IMPRESSION / MDM / ASSESSMENT AND PLAN / ED COURSE  I reviewed the triage vital signs and the nursing notes.  Differential diagnosis includes, but is not limited to, viral pharyngitis, strep pharyngitis, no evidence of peritonsillar abscess, airway compromise  Patient's presentation is most consistent with acute illness / injury with system symptoms.  68 year old female presenting to the ER with sore throat.  Vital stable on presentation.  Viral swabs with swab both negative.  Discussed likely viral pharyngitis and supportive care.  Discussed risk and benefits of steroids as the patient does have a history of diabetes and she does wish to proceed with a one-time  dose of Decadron.  Will also DC with viscous lidocaine.  Strict return precautions provided.  Patient discharged stable condition.      FINAL CLINICAL IMPRESSION(S) / ED DIAGNOSES   Final diagnoses:  Viral pharyngitis     Rx / DC Orders   ED Discharge Orders          Ordered    lidocaine (XYLOCAINE) 2 % solution  Every 4 hours PRN        03/06/23 1206             Note:  This document was prepared using Dragon voice recognition software and may include unintentional dictation errors.   [name], MD 03/06/23 [phone]

## 2023-03-06 NOTE — ED Triage Notes (Signed)
Pt reports sore throat that began Sunday, states went to [facility] and was diagnosed with covid but at home swab was neg. Pt unsure if fever at home. NAD noted.

## 2023-03-12 ENCOUNTER — Other Ambulatory Visit: Payer: Self-pay

## 2023-03-12 MED ORDER — OZEMPIC (0.25 OR 0.5 MG/DOSE) 2 MG/3ML ~~LOC~~ SOPN
0.2500 mg | PEN_INJECTOR | SUBCUTANEOUS | 3 refills | Status: AC
  Filled 2023-03-12 – 2023-03-13 (×2): qty 3, 28d supply, fill #0

## 2023-03-12 MED ORDER — AZITHROMYCIN 250 MG PO TABS
ORAL_TABLET | ORAL | 0 refills | Status: AC
  Filled 2023-03-12: qty 6, 5d supply, fill #0

## 2023-03-12 NOTE — Progress Notes (Signed)
Pt with sore throat so viral testing done.

## 2023-03-13 ENCOUNTER — Other Ambulatory Visit: Payer: Self-pay

## 2023-03-16 ENCOUNTER — Other Ambulatory Visit: Payer: Self-pay

## 2023-03-17 ENCOUNTER — Other Ambulatory Visit: Payer: Self-pay

## 2023-03-17 MED ORDER — MYCOPHENOLATE MOFETIL 500 MG PO TABS
500.0000 mg | ORAL_TABLET | Freq: Every day | ORAL | 2 refills | Status: AC
  Filled 2023-03-17: qty 90, 90d supply, fill #0

## 2023-03-18 ENCOUNTER — Other Ambulatory Visit: Payer: Self-pay

## 2023-03-20 ENCOUNTER — Other Ambulatory Visit: Payer: Self-pay

## 2023-03-20 MED ORDER — NYSTATIN 100000 UNIT/ML MT SUSP
5.0000 mL | Freq: Four times a day (QID) | OROMUCOSAL | 0 refills | Status: AC
  Filled 2023-03-20: qty 60, 3d supply, fill #0

## 2023-04-14 ENCOUNTER — Other Ambulatory Visit: Payer: Self-pay

## 2023-04-14 MED ORDER — OZEMPIC (0.25 OR 0.5 MG/DOSE) 2 MG/3ML ~~LOC~~ SOPN
0.2500 mg | PEN_INJECTOR | SUBCUTANEOUS | 3 refills | Status: AC
  Filled 2023-04-14: qty 3, 28d supply, fill #0
  Filled 2023-04-17: qty 3, 56d supply, fill #0

## 2023-04-17 ENCOUNTER — Other Ambulatory Visit: Payer: Self-pay

## 2023-04-24 ENCOUNTER — Other Ambulatory Visit: Payer: Self-pay

## 2023-04-24 ENCOUNTER — Other Ambulatory Visit (HOSPITAL_COMMUNITY): Payer: Self-pay

## 2023-04-24 MED ORDER — MYCOPHENOLATE MOFETIL 500 MG PO TABS
500.0000 mg | ORAL_TABLET | Freq: Every day | ORAL | 2 refills | Status: AC
  Filled 2023-04-24 – 2023-04-28 (×2): qty 120, 120d supply, fill #0
  Filled 2023-05-04: qty 90, 90d supply, fill #0
  Filled 2023-08-17: qty 90, 90d supply, fill #1
  Filled 2023-12-22: qty 90, 90d supply, fill #2

## 2023-04-28 ENCOUNTER — Other Ambulatory Visit: Payer: Self-pay

## 2023-05-04 ENCOUNTER — Other Ambulatory Visit: Payer: Self-pay

## 2023-07-24 ENCOUNTER — Other Ambulatory Visit: Payer: Self-pay

## 2023-07-24 MED ORDER — ATORVASTATIN CALCIUM 10 MG PO TABS
10.0000 mg | ORAL_TABLET | Freq: Every day | ORAL | 3 refills | Status: AC
  Filled 2023-07-24 – 2023-10-26 (×3): qty 90, 90d supply, fill #0
  Filled 2024-01-11: qty 90, 90d supply, fill #1

## 2023-07-24 MED ORDER — FLUTICASONE PROPIONATE 50 MCG/ACT NA SUSP
1.0000 | Freq: Every day | NASAL | 12 refills | Status: AC
  Filled 2023-07-24: qty 16, 60d supply, fill #0

## 2023-07-24 MED ORDER — OMEPRAZOLE 20 MG PO CPDR
20.0000 mg | DELAYED_RELEASE_CAPSULE | Freq: Every day | ORAL | 3 refills | Status: AC
  Filled 2023-07-24 – 2023-10-26 (×3): qty 90, 90d supply, fill #0
  Filled 2024-01-11: qty 90, 90d supply, fill #1

## 2023-07-24 MED ORDER — METFORMIN HCL ER 500 MG PO TB24
1000.0000 mg | ORAL_TABLET | Freq: Two times a day (BID) | ORAL | 3 refills | Status: AC
  Filled 2023-07-24: qty 360, 90d supply, fill #0

## 2023-07-24 MED ORDER — CLOBETASOL PROPIONATE 0.05 % EX OINT
TOPICAL_OINTMENT | Freq: Two times a day (BID) | CUTANEOUS | 3 refills | Status: AC
  Filled 2023-07-24: qty 30, 30d supply, fill #0

## 2023-07-24 MED ORDER — HYDROCHLOROTHIAZIDE 25 MG PO TABS
25.0000 mg | ORAL_TABLET | Freq: Every day | ORAL | 3 refills | Status: AC
  Filled 2023-07-24: qty 90, 90d supply, fill #0

## 2023-07-24 MED ORDER — ALBUTEROL SULFATE HFA 108 (90 BASE) MCG/ACT IN AERS
2.0000 | INHALATION_SPRAY | RESPIRATORY_TRACT | 3 refills | Status: AC | PRN
  Filled 2023-07-24: qty 6.7, 25d supply, fill #0

## 2023-07-27 ENCOUNTER — Other Ambulatory Visit: Payer: Self-pay

## 2023-08-03 ENCOUNTER — Other Ambulatory Visit: Payer: Self-pay

## 2023-08-11 ENCOUNTER — Other Ambulatory Visit: Payer: Self-pay

## 2023-10-14 ENCOUNTER — Other Ambulatory Visit: Payer: Self-pay

## 2023-10-26 ENCOUNTER — Other Ambulatory Visit: Payer: Self-pay

## 2023-10-26 ENCOUNTER — Other Ambulatory Visit (HOSPITAL_COMMUNITY): Payer: Self-pay

## 2023-11-04 ENCOUNTER — Other Ambulatory Visit: Payer: Self-pay

## 2023-11-05 ENCOUNTER — Other Ambulatory Visit: Payer: Self-pay

## 2023-12-10 ENCOUNTER — Other Ambulatory Visit: Payer: Self-pay

## 2023-12-10 MED ORDER — CLOBETASOL PROPIONATE 0.05 % EX OINT
TOPICAL_OINTMENT | Freq: Two times a day (BID) | CUTANEOUS | 3 refills | Status: AC
  Filled 2023-12-10: qty 30, 30d supply, fill #0

## 2023-12-10 MED ORDER — OMEPRAZOLE 20 MG PO CPDR: 20.0000 mg | DELAYED_RELEASE_CAPSULE | Freq: Every day | ORAL | 3 refills | Status: AC

## 2023-12-10 MED ORDER — FLUTICASONE PROPIONATE 50 MCG/ACT NA SUSP
1.0000 | Freq: Every day | NASAL | 12 refills | Status: AC
  Filled 2023-12-10: qty 16, 60d supply, fill #0

## 2023-12-10 MED ORDER — ALBUTEROL SULFATE HFA 108 (90 BASE) MCG/ACT IN AERS
2.0000 | INHALATION_SPRAY | RESPIRATORY_TRACT | 3 refills | Status: AC | PRN
  Filled 2023-12-10: qty 8.5, 30d supply, fill #0

## 2023-12-10 MED ORDER — HYDROCHLOROTHIAZIDE 25 MG PO TABS
25.0000 mg | ORAL_TABLET | Freq: Every day | ORAL | 3 refills | Status: AC
  Filled 2023-12-10: qty 90, 90d supply, fill #0

## 2023-12-10 MED ORDER — ATORVASTATIN CALCIUM 10 MG PO TABS: 10.0000 mg | ORAL_TABLET | Freq: Every day | ORAL | 3 refills | Status: AC

## 2023-12-10 MED ORDER — METFORMIN HCL ER 500 MG PO TB24
1000.0000 mg | ORAL_TABLET | Freq: Two times a day (BID) | ORAL | 3 refills | Status: AC
  Filled 2023-12-10: qty 360, 90d supply, fill #0

## 2023-12-17 ENCOUNTER — Other Ambulatory Visit: Payer: Self-pay

## 2024-01-12 ENCOUNTER — Other Ambulatory Visit: Payer: Self-pay

## 2024-01-22 ENCOUNTER — Other Ambulatory Visit: Payer: Self-pay

## 2024-01-22 MED ORDER — NAPROXEN 500 MG PO TABS
500.0000 mg | ORAL_TABLET | Freq: Two times a day (BID) | ORAL | 3 refills | Status: AC | PRN
  Filled 2024-01-22: qty 60, 30d supply, fill #0

## 2024-02-06 ENCOUNTER — Other Ambulatory Visit: Payer: Self-pay

## 2024-02-06 MED ORDER — FLUCONAZOLE 150 MG PO TABS
150.0000 mg | ORAL_TABLET | ORAL | 0 refills | Status: AC
  Filled 2024-02-06: qty 3, 9d supply, fill #0

## 2024-02-06 MED ORDER — AMOXICILLIN-POT CLAVULANATE 875-125 MG PO TABS
1.0000 | ORAL_TABLET | Freq: Two times a day (BID) | ORAL | 0 refills | Status: AC
  Filled 2024-02-06: qty 20, 10d supply, fill #0
